# Patient Record
Sex: Female | Born: 1970 | Race: White | Hispanic: No | Marital: Single | Smoking: Never smoker
Health system: Southern US, Community
[De-identification: ages and names within clinical notes are randomized; demographics above are authoritative.]

## PROBLEM LIST (undated history)

## (undated) DIAGNOSIS — I10 Essential (primary) hypertension: Secondary | ICD-10-CM

## (undated) DIAGNOSIS — O24429 Gestational diabetes mellitus in childbirth, unspecified control: Secondary | ICD-10-CM

## (undated) HISTORY — DX: Gestational diabetes mellitus in childbirth, unspecified control: O24.429

## (undated) HISTORY — PX: TONSILLECTOMY AND ADENOIDECTOMY: SUR1326

---
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
## 2008-05-06 ENCOUNTER — Ambulatory Visit: Payer: Self-pay | Admitting: Family Medicine

## 2008-05-06 DIAGNOSIS — J0100 Acute maxillary sinusitis, unspecified: Secondary | ICD-10-CM

## 2008-05-06 DIAGNOSIS — E282 Polycystic ovarian syndrome: Secondary | ICD-10-CM | POA: Insufficient documentation

## 2008-05-06 DIAGNOSIS — J209 Acute bronchitis, unspecified: Secondary | ICD-10-CM

## 2011-08-21 LAB — HM PAP SMEAR: HM Pap smear: NEGATIVE

## 2011-12-27 ENCOUNTER — Encounter: Payer: Self-pay | Admitting: Family Medicine

## 2011-12-27 ENCOUNTER — Ambulatory Visit (INDEPENDENT_AMBULATORY_CARE_PROVIDER_SITE_OTHER): Payer: 59 | Admitting: Family Medicine

## 2011-12-27 VITALS — BP 158/107 | HR 98 | Ht 63.0 in | Wt 242.0 lb

## 2011-12-27 DIAGNOSIS — R7301 Impaired fasting glucose: Secondary | ICD-10-CM

## 2011-12-27 DIAGNOSIS — E669 Obesity, unspecified: Secondary | ICD-10-CM

## 2011-12-27 DIAGNOSIS — Z6829 Body mass index (BMI) 29.0-29.9, adult: Secondary | ICD-10-CM | POA: Insufficient documentation

## 2011-12-27 DIAGNOSIS — E559 Vitamin D deficiency, unspecified: Secondary | ICD-10-CM

## 2011-12-27 DIAGNOSIS — E785 Hyperlipidemia, unspecified: Secondary | ICD-10-CM

## 2011-12-27 DIAGNOSIS — I10 Essential (primary) hypertension: Secondary | ICD-10-CM | POA: Insufficient documentation

## 2011-12-27 DIAGNOSIS — Z6841 Body Mass Index (BMI) 40.0 and over, adult: Secondary | ICD-10-CM

## 2011-12-27 DIAGNOSIS — Z6830 Body mass index (BMI) 30.0-30.9, adult: Secondary | ICD-10-CM | POA: Insufficient documentation

## 2011-12-27 DIAGNOSIS — Z6837 Body mass index (BMI) 37.0-37.9, adult: Secondary | ICD-10-CM | POA: Insufficient documentation

## 2011-12-27 DIAGNOSIS — E119 Type 2 diabetes mellitus without complications: Secondary | ICD-10-CM

## 2011-12-27 MED ORDER — LISINOPRIL 20 MG PO TABS: 20.0000 mg | ORAL_TABLET | Freq: Every day | ORAL | Status: DC

## 2011-12-27 NOTE — Progress Notes (Signed)
Subjective:    Patient ID: Megan [name], female    DOB: 1970/11/28, 41 y.o.   MRN: [id number]  HPI HTN - BP has been high for the 1.5 years. Does run in her family. High at gyn and dentist appt.  Will occ gets HA and flushing when BP is really high. Work has been really stressful.  Had bloodwork done in Jan.  She has been obese and stayed the same weight for the last 11 years.     Review of Systems  Constitutional: Negative for fever, diaphoresis and unexpected weight change.  HENT: Negative for hearing loss, rhinorrhea and tinnitus.   Eyes: Negative for visual disturbance.  Respiratory: Negative for cough and wheezing.   Cardiovascular: Negative for chest pain and palpitations.  Gastrointestinal: Negative for nausea, vomiting, diarrhea and blood in stool.  Genitourinary: Negative for vaginal bleeding, vaginal discharge and difficulty urinating.  Musculoskeletal: Negative for myalgias and arthralgias.  Skin: Negative for rash.  Neurological: Negative for headaches.  Hematological: Negative for adenopathy. Does not bruise/bleed easily.  Psychiatric/Behavioral: Negative for disturbed wake/sleep cycle and dysphoric mood. The patient is not nervous/anxious.    BP 158/107  Pulse 98  Ht 5\' 3"  (1.6 m)  Wt 242 lb (109.77 kg)  BMI 42.87 kg/m2    No Known Allergies  Past Medical History  Diagnosis Date  . Gestational diabetes mellitus in childbirth     Past Surgical History  Procedure Date  . Tonsillectomy and adenoidectomy   . Tympanostomy tube placement 1984    History   Social History  . Marital Status: Single    Spouse Name: Evelynne Spiers    Number of Children: N/A  . Years of Education: N/A   Occupational History  . Asst Director [employer]    Social History Main Topics  . Smoking status: Never Smoker   . Smokeless tobacco: Not on file  . Alcohol Use: No  . Drug Use: No  . Sexually Active: Yes -- Female partner(s)   Other Topics Concern  . Not on file    Social History Narrative   2 caffeine drinks per day. No regular exercise. She works full-time and is going to school.    Family History  Problem Relation Age of Onset  . Hypertension Mother   . Hypertension Father   . Hypertension Sister   . Hypertension Brother   . Peripheral vascular disease Maternal Grandmother     Outpatient Encounter Prescriptions as of 12/27/2011  Medication Sig Dispense Refill  . norgestrel-ethinyl estradiol (LO/OVRAL,CRYSELLE) 0.3-30 MG-MCG tablet Take 1 tablet by mouth daily.      Marland Kitchen lisinopril (PRINIVIL,ZESTRIL) 20 MG tablet Take 1 tablet (20 mg total) by mouth daily.  30 tablet  1          Objective:   Physical Exam  Constitutional: She is oriented to person, place, and time. She appears well-developed and well-nourished.  HENT:  Head: Normocephalic and atraumatic.  Cardiovascular: Normal rate, regular rhythm and normal heart sounds.        No carotid bruits.   Pulmonary/Chest: Effort normal and breath sounds normal.  Musculoskeletal: She exhibits no edema.  Neurological: She is alert and oriented to person, place, and time.  Skin: Skin is warm and dry.  Psychiatric: She has a normal mood and affect. Her behavior is normal.          Assessment & Plan:  HTN - New dx.  Discussed dx and tx options. Discussed DASH  diet and low salt diet.  Will start medications as well. Continue work on exercise and diet. Followup in 4 weeks to recheck blood pressure. She can call at any point she has any problems with the lisinopril. We did discuss potential side effects. She was on a release for me to get blood work from her GYN that was drawn in January.  Obesity - Work on diet and exercise.  Had tried metformin in the past for PCOS abut caused stomach pain and diarrhea. She never tried fiber milligrams once a day to see if that works a little better for her without causing some of the GI symptoms.  Vitamin D deficiency-she does report a history of this. She  was given a prescription for 6 weeks but never went back to have her labs rechecked. She would like to do that today.  Based on her history it sounds like she most likely has impaired fasting glucose. We'll check a hemoglobin A1c today. Hemoglobin A1c confirmed impaired fasting glucose today. Continue to work on avoiding concentrated sweets and watching portion sizes on her carbohydrates and getting regular exercise. We also discussed that we could consider retrying metformin at some point in time but with a very low dosage is 500 mg once a day, since she did not tolerate the twice a day dosing.

## 2011-12-27 NOTE — Patient Instructions (Signed)
DASH Diet The DASH diet stands for "Dietary Approaches to Stop Hypertension." It is a healthy eating plan that has been shown to reduce high blood pressure (hypertension) in as little as 14 days, while also possibly providing other significant health benefits. These other health benefits include reducing the risk of breast cancer after menopause and reducing the risk of type 2 diabetes, heart disease, colon cancer, and stroke. Health benefits also include weight loss and slowing kidney failure in patients with chronic kidney disease.   DIET GUIDELINES  Limit salt (sodium). Your diet should contain less than 1500 mg of sodium daily.   Limit refined or processed carbohydrates. Your diet should include mostly whole grains. Desserts and added sugars should be used sparingly.   Include small amounts of heart-healthy fats. These types of fats include nuts, oils, and tub margarine. Limit saturated and trans fats. These fats have been shown to be harmful in the body.  CHOOSING FOODS   The following food groups are based on a 2000 calorie diet. See your Registered Dietitian for individual calorie needs. Grains and Grain Products (6 to 8 servings daily)  Eat More Often: Whole-wheat bread, brown rice, whole-grain or wheat pasta, quinoa, popcorn without added fat or salt (air popped).   Eat Less Often: White bread, white pasta, white rice, cornbread.  Vegetables (4 to 5 servings daily)  Eat More Often: Fresh, frozen, and canned vegetables. Vegetables may be raw, steamed, roasted, or grilled with a minimal amount of fat.   Eat Less Often/Avoid: Creamed or fried vegetables. Vegetables in a cheese sauce.  Fruit (4 to 5 servings daily)  Eat More Often: All fresh, canned (in natural juice), or frozen fruits. Dried fruits without added sugar. One hundred percent fruit juice ( cup [237 mL] daily).   Eat Less Often: Dried fruits with added sugar. Canned fruit in light or heavy syrup.  Lean Meats, Fish, and  Poultry (2 servings or less daily. One serving is 3 to 4 oz [85-114 g]).  Eat More Often: Ninety percent or leaner ground beef, tenderloin, sirloin. Round cuts of beef, chicken breast, turkey breast. All fish. Grill, bake, or broil your meat. Nothing should be fried.   Eat Less Often/Avoid: Fatty cuts of meat, turkey, or chicken leg, thigh, or wing. Fried cuts of meat or fish.  Dairy (2 to 3 servings)  Eat More Often: Low-fat or fat-free milk, low-fat plain or light yogurt, reduced-fat or part-skim cheese.   Eat Less Often/Avoid: Milk (whole, 2%, skim, or chocolate). Whole milk yogurt. Full-fat cheeses.  Nuts, Seeds, and Legumes (4 to 5 servings per week)  Eat More Often: All without added salt.   Eat Less Often/Avoid: Salted nuts and seeds, canned beans with added salt.  Fats and Sweets (limited)  Eat More Often: Vegetable oils, tub margarines without trans fats, sugar-free gelatin. Mayonnaise and salad dressings.   Eat Less Often/Avoid: Coconut oils, palm oils, butter, stick margarine, cream, half and half, cookies, candy, pie.  FOR MORE INFORMATION The Dash Diet Eating Plan: www.dashdiet.org Document Released: 04/14/2011 Document Reviewed: 04/04/2011 ExitCare Patient Information 2012 [employer].1.5 Gram Low Sodium Diet A 1.5 gram sodium diet restricts the amount of sodium in the diet to no more than 1.5 g or 1500 mg daily. The American Heart Association recommends Americans over the age of 20 to consume no more than 1500 mg of sodium each day to reduce the risk of developing high blood pressure. Research also shows that limiting sodium may reduce heart attack   and stroke risk. Many foods contain sodium for flavor and sometimes as a preservative. When the amount of sodium in a diet needs to be low, it is important to know what to look for when choosing foods and drinks. The following includes some information and guidelines to help make it easier for you to adapt to a low sodium  diet. QUICK TIPS  Do not add salt to food.   Avoid convenience items and fast food.   Choose unsalted snack foods.   Buy lower sodium products, often labeled as "lower sodium" or "no salt added."   Check food labels to learn how much sodium is in 1 serving.   When eating at a restaurant, ask that your food be prepared with less salt or none, if possible.  READING FOOD LABELS FOR SODIUM INFORMATION The nutrition facts label is a good place to find how much sodium is in foods. Look for products with no more than 400 mg of sodium per serving. Remember that 1.5 g = 1500 mg. The food label may also list foods as:  Sodium-free: Less than 5 mg in a serving.   Very low sodium: 35 mg or less in a serving.   Low-sodium: 140 mg or less in a serving.   Light in sodium: 50% less sodium in a serving. For example, if a food that usually has 300 mg of sodium is changed to become light in sodium, it will have 150 mg of sodium.   Reduced sodium: 25% less sodium in a serving. For example, if a food that usually has 400 mg of sodium is changed to reduced sodium, it will have 300 mg of sodium.  CHOOSING FOODS Grains  Avoid: Salted crackers and snack items. Some cereals, including instant hot cereals. Bread stuffing and biscuit mixes. Seasoned rice or pasta mixes.   Choose: Unsalted snack items. Low-sodium cereals, oats, puffed wheat and rice, shredded wheat. English muffins and bread. Pasta.  Meats  Avoid: Salted, canned, smoked, spiced, pickled meats, including fish and poultry. Bacon, ham, sausage, cold cuts, hot dogs, anchovies.   Choose: Low-sodium canned tuna and salmon. Fresh or frozen meat, poultry, and fish.  Dairy  Avoid: Processed cheese and spreads. Cottage cheese. Buttermilk and condensed milk. Regular cheese.   Choose: Milk. Low-sodium cottage cheese. Yogurt. Sour cream. Low-sodium cheese.  Fruits and Vegetables  Avoid: Regular canned vegetables. Regular canned tomato sauce and  paste. Frozen vegetables in sauces. Olives. Pickles. Relishes. Sauerkraut.   Choose: Low-sodium canned vegetables. Low-sodium tomato sauce and paste. Frozen or fresh vegetables. Fresh and frozen fruit.  Condiments  Avoid: Canned and packaged gravies. Worcestershire sauce. Tartar sauce. Barbecue sauce. Soy sauce. Steak sauce. Ketchup. Onion, garlic, and table salt. Meat flavorings and tenderizers.   Choose: Fresh and dried herbs and spices. Low-sodium varieties of mustard and ketchup. Lemon juice. Tabasco sauce. Horseradish.  SAMPLE 1.5 GRAM SODIUM MEAL PLAN Breakfast / Sodium (mg)  1 cup low-fat milk / 143 mg   1 whole-wheat English muffin / 240 mg   1 tbs heart-healthy margarine / 153 mg   1 hard-boiled egg / 139 mg   1 small orange / 0 mg  Lunch / Sodium (mg)  1 cup raw carrots / 76 mg   2 tbs no salt added peanut butter / 5 mg   2 slices whole-wheat bread / 270 mg   1 tbs jelly / 6 mg    cup red grapes / 2 mg  Dinner / Sodium (mg)    1 cup whole-wheat pasta / 2 mg   1 cup low-sodium tomato sauce / 73 mg   3 oz lean ground beef / 57 mg   1 small side salad (1 cup raw spinach leaves,  cup cucumber,  cup yellow bell pepper) with 1 tsp olive oil and 1 tsp red wine vinegar / 25 mg  Snack / Sodium (mg)  1 container low-fat vanilla yogurt / 107 mg   3 graham cracker squares / 127 mg  Nutrient Analysis  Calories: 1745   Protein: 75 g   Carbohydrate: 237 g   Fat: 57 g   Sodium: 1425 mg  Document Released: 04/25/2005 Document Revised: 04/14/2011 Document Reviewed: 07/27/2009 ExitCare Patient Information 2012 [employer]. 

## 2011-12-28 LAB — VITAMIN D 25 HYDROXY (VIT D DEFICIENCY, FRACTURES): Vit D, 25-Hydroxy: 51 ng/mL (ref 30–89)

## 2012-01-30 ENCOUNTER — Encounter: Payer: Self-pay | Admitting: Family Medicine

## 2012-01-30 ENCOUNTER — Ambulatory Visit (INDEPENDENT_AMBULATORY_CARE_PROVIDER_SITE_OTHER): Payer: 59 | Admitting: Family Medicine

## 2012-01-30 VITALS — BP 141/97 | HR 101 | Wt 241.0 lb

## 2012-01-30 DIAGNOSIS — Z23 Encounter for immunization: Secondary | ICD-10-CM

## 2012-01-30 DIAGNOSIS — I10 Essential (primary) hypertension: Secondary | ICD-10-CM

## 2012-01-30 MED ORDER — LISINOPRIL-HYDROCHLOROTHIAZIDE 20-25 MG PO TABS: 1.0000 | ORAL_TABLET | Freq: Every day | ORAL | Status: DC

## 2012-01-30 NOTE — Progress Notes (Signed)
  Subjective:    Patient ID: Megan [name], female    DOB: 1970/07/02, 41 y.o.   MRN: [id number]  HPI HTN -doing well on medication. No chest pain or short of breath. No side effects. She says she's taking it daily. She's continuing to work on diet and weight. She has lost 1 pound.   Review of Systems     Objective:   Physical Exam  Constitutional: She is oriented to person, place, and time. She appears well-developed and well-nourished.  HENT:  Head: Normocephalic and atraumatic.  Cardiovascular: Normal rate, regular rhythm and normal heart sounds.   Pulmonary/Chest: Effort normal and breath sounds normal.  Neurological: She is alert and oriented to person, place, and time.  Skin: Skin is warm and dry.  Psychiatric: She has a normal mood and affect. Her behavior is normal.          Assessment & Plan:  HTN-much improved but still not at goal. Uncontrolled. We will add chlorothiazide to her lisinopril. Follow up in 3-4 weeks to make sure that reaching goal. Check BMP today since he started a ACE inhibitor.  Tdap given today

## 2012-01-31 LAB — BASIC METABOLIC PANEL WITH GFR
BUN: 10 mg/dL (ref 6–23)
CO2: 27 mEq/L (ref 19–32)
Calcium: 9.1 mg/dL (ref 8.4–10.5)
Chloride: 105 mEq/L (ref 96–112)
Creat: 0.79 mg/dL (ref 0.50–1.10)
GFR, Est Non African American: >89 mL/min

## 2012-02-03 ENCOUNTER — Telehealth: Payer: Self-pay | Admitting: *Deleted

## 2012-02-03 NOTE — Telephone Encounter (Signed)
Pt notified of MD instrutions and sent to scheduleing to make NV appt for Monday. KG LPN

## 2012-02-03 NOTE — Telephone Encounter (Signed)
Pt states she started taking the Lisinopril-HCTZ on Tuesday and states that she has had increased pulse, dizzy, hot spells and muscle spasms.

## 2012-02-03 NOTE — Telephone Encounter (Signed)
BP could be dropping too low which can cause tachycardia and dizziness. Recommend cut tab in half through the weekend and come in on Monday for blood pressure check.

## 2012-02-06 ENCOUNTER — Ambulatory Visit (INDEPENDENT_AMBULATORY_CARE_PROVIDER_SITE_OTHER): Payer: 59 | Admitting: Family Medicine

## 2012-02-06 VITALS — BP 150/97 | HR 105

## 2012-02-06 DIAGNOSIS — I10 Essential (primary) hypertension: Secondary | ICD-10-CM

## 2012-02-06 MED ORDER — LOSARTAN POTASSIUM-HCTZ 100-12.5 MG PO TABS: 1.0000 | ORAL_TABLET | Freq: Every day | ORAL | Status: DC

## 2012-02-06 NOTE — Progress Notes (Signed)
  Subjective:    Patient ID: Megan [name], female    DOB: [birth year], 41 y.o.   MRN: [id number]  HPI    Pt denies chest pain, SOB, dizziness, or heart palpitations.  Taking half of the BP med as directed.Cramping in the legs not as worse on Friday. 5 min spent with the pt.   Pt states she started taking the Lisinopril-HCTZ on Tuesday and states that she has had increased pulse, dizzy, hot spells and muscle spasms. I had told her to cut her tab in half.     Review of Systems     Objective:   Physical Exam        Assessment & Plan:  HTN - Not well controlled.  Will change to losartan 100/12.5mg  since didn't feel well on 25mg  of HCTZ.  F/U in one month with med for BP.   Nani Gasser, MD

## 2012-02-07 ENCOUNTER — Telehealth: Payer: Self-pay | Admitting: *Deleted

## 2012-02-07 NOTE — Telephone Encounter (Signed)
Pt was notified of dr. Shelah Lewandowsky advise re nurse visit this am

## 2012-02-20 ENCOUNTER — Ambulatory Visit (INDEPENDENT_AMBULATORY_CARE_PROVIDER_SITE_OTHER): Payer: 59 | Admitting: Family Medicine

## 2012-02-20 ENCOUNTER — Encounter: Payer: Self-pay | Admitting: Family Medicine

## 2012-02-20 VITALS — BP 153/93 | HR 101 | Ht 62.5 in | Wt 244.0 lb

## 2012-02-20 DIAGNOSIS — I10 Essential (primary) hypertension: Secondary | ICD-10-CM

## 2012-02-20 NOTE — Progress Notes (Signed)
  Subjective:    Patient ID: Makenley Schwieterman, female    DOB: Sep 22, 1970, 41 y.o.   MRN: [id number]  HPI HTN - She didn't take her meds until about about an hour ago. Was staying at sisters house and forget hre meds. She has been feeling much better on the lower diuretic.  Dizziness is resolved. No LE swelling.    Review of Systems     Objective:   Physical Exam  Constitutional: She is oriented to person, place, and time. She appears well-developed and well-nourished.  HENT:  Head: Normocephalic and atraumatic.  Cardiovascular: Normal rate, regular rhythm and normal heart sounds.   Pulmonary/Chest: Effort normal and breath sounds normal.  Neurological: She is alert and oriented to person, place, and time.  Skin: Skin is warm and dry.  Psychiatric: She has a normal mood and affect. Her behavior is normal.          Assessment & Plan:  HTN - Continue current regimen.  Will avoid increasing her hctz to 25. If still not controlled when comes back in for nurse BP check then will add metoprolol.

## 2012-02-24 ENCOUNTER — Ambulatory Visit (INDEPENDENT_AMBULATORY_CARE_PROVIDER_SITE_OTHER): Payer: 59 | Admitting: Family Medicine

## 2012-02-24 VITALS — BP 139/88 | HR 86

## 2012-02-24 DIAGNOSIS — I10 Essential (primary) hypertension: Secondary | ICD-10-CM

## 2012-02-24 MED ORDER — METOPROLOL SUCCINATE ER 25 MG PO TB24: 25.0000 mg | ORAL_TABLET | Freq: Every day | ORAL | Status: DC

## 2012-02-24 NOTE — Progress Notes (Signed)
LMOM

## 2012-02-24 NOTE — Progress Notes (Signed)
  Subjective:    Patient ID: Megan [name], female    DOB: 1971/05/07, 41 y.o.   MRN: [id number] Pt denies CP, SOB, dizziness, or heart palpitations. taking meds as directed without problems. Denies med side effects. 5 min spent with pt.  HPI    Review of Systems     Objective:   Physical Exam        Assessment & Plan:  HTN - Borderline.  Will add metoprolol to her regimen. Followup with me in 6 weeks for repeat blood pressure. Prescription sent to pharmacy.Nani Gasser, MD

## 2012-04-04 ENCOUNTER — Ambulatory Visit: Payer: 59 | Admitting: Family Medicine

## 2012-04-12 ENCOUNTER — Encounter: Payer: Self-pay | Admitting: Family Medicine

## 2012-04-12 ENCOUNTER — Ambulatory Visit (INDEPENDENT_AMBULATORY_CARE_PROVIDER_SITE_OTHER): Payer: 59 | Admitting: Family Medicine

## 2012-04-12 VITALS — BP 114/74 | HR 85 | Ht 63.0 in | Wt 239.0 lb

## 2012-04-12 DIAGNOSIS — E669 Obesity, unspecified: Secondary | ICD-10-CM

## 2012-04-12 DIAGNOSIS — I10 Essential (primary) hypertension: Secondary | ICD-10-CM

## 2012-04-12 LAB — BASIC METABOLIC PANEL WITH GFR
Chloride: 106 mEq/L (ref 96–112)
GFR, Est African American: >89 mL/min
GFR, Est Non African American: 82 mL/min
Potassium: 3.9 mEq/L (ref 3.5–5.3)
Sodium: 141 mEq/L (ref 135–145)

## 2012-04-12 LAB — LIPID PANEL
Total CHOL/HDL Ratio: 4.5 Ratio
VLDL: 18 mg/dL (ref 0–40)

## 2012-04-12 NOTE — Progress Notes (Signed)
  Subjective:    Patient ID: Megan [name], female    DOB: [birth year], 41 y.o.   MRN: [id number]  HPI HTN -  Pt denies chest pain, SOB, dizziness, or heart palpitations.  Taking meds as directed w/o problems.  Denies medication side effects.  Tolerating new med well. Has lost 5 lbs.      Review of Systems     Objective:   Physical Exam  Constitutional: She is oriented to person, place, and time. She appears well-developed and well-nourished.  HENT:  Head: Normocephalic and atraumatic.  Cardiovascular: Normal rate, regular rhythm and normal heart sounds.   Pulmonary/Chest: Effort normal and breath sounds normal.  Neurological: She is alert and oriented to person, place, and time.  Skin: Skin is warm and dry.  Psychiatric: She has a normal mood and affect. Her behavior is normal.          Assessment & Plan:  HTN  - Well controlled. Doing well on regimen. F/U in 6 months. Great job on weight loss. Due Frlo CMP and lipids.   Obesity - she is doing great!  Has lost 5 days. Encourage exercise too.

## 2012-04-20 ENCOUNTER — Other Ambulatory Visit: Payer: Self-pay | Admitting: Family Medicine

## 2012-06-06 ENCOUNTER — Other Ambulatory Visit: Payer: Self-pay | Admitting: Family Medicine

## 2012-06-09 ENCOUNTER — Other Ambulatory Visit: Payer: Self-pay | Admitting: Family Medicine

## 2012-08-27 ENCOUNTER — Other Ambulatory Visit: Payer: Self-pay | Admitting: Family Medicine

## 2012-09-13 ENCOUNTER — Telehealth: Payer: Self-pay | Admitting: *Deleted

## 2012-09-13 NOTE — Telephone Encounter (Signed)
Pt called to inform that she will have a lapse in insurance coverage and will need med refills. lvm asking for return call.Megan [name] [place]

## 2012-09-24 ENCOUNTER — Telehealth: Payer: Self-pay | Admitting: Family Medicine

## 2012-09-24 NOTE — Telephone Encounter (Signed)
Call pt: letter fro insurance company says not taking her meds reguarly.  Encourage her to be consistant and if needs help getting her meds then please let us know.

## 2012-09-25 NOTE — Telephone Encounter (Signed)
Spoke with pt she stated that she is taking her meds on a consistent basis  And has a f/u appt on 6.5.2014 for bp.Heath Gold'

## 2012-09-25 NOTE — Telephone Encounter (Signed)
Called and lvm for return call.Megan [name]  

## 2012-10-11 ENCOUNTER — Encounter: Payer: Self-pay | Admitting: Family Medicine

## 2012-10-11 ENCOUNTER — Ambulatory Visit (INDEPENDENT_AMBULATORY_CARE_PROVIDER_SITE_OTHER): Payer: 59 | Admitting: Family Medicine

## 2012-10-11 ENCOUNTER — Ambulatory Visit (HOSPITAL_BASED_OUTPATIENT_CLINIC_OR_DEPARTMENT_OTHER)
Admission: RE | Admit: 2012-10-11 | Discharge: 2012-10-11 | Disposition: A | Discharge status: Home / Self Care | Payer: 59 | Source: Ambulatory Visit | Attending: Family Medicine | Admitting: Family Medicine

## 2012-10-11 ENCOUNTER — Telehealth: Payer: Self-pay | Admitting: Family Medicine

## 2012-10-11 VITALS — BP 120/70 | HR 78 | Ht 63.0 in | Wt 236.0 lb

## 2012-10-11 DIAGNOSIS — I10 Essential (primary) hypertension: Secondary | ICD-10-CM

## 2012-10-11 DIAGNOSIS — M545 Low back pain: Secondary | ICD-10-CM

## 2012-10-11 DIAGNOSIS — M51379 Other intervertebral disc degeneration, lumbosacral region without mention of lumbar back pain or lower extremity pain: Secondary | ICD-10-CM | POA: Insufficient documentation

## 2012-10-11 DIAGNOSIS — M5137 Other intervertebral disc degeneration, lumbosacral region: Secondary | ICD-10-CM | POA: Insufficient documentation

## 2012-10-11 DIAGNOSIS — M4120 Other idiopathic scoliosis, site unspecified: Secondary | ICD-10-CM | POA: Insufficient documentation

## 2012-10-11 DIAGNOSIS — R7301 Impaired fasting glucose: Secondary | ICD-10-CM

## 2012-10-11 LAB — POCT GLYCOSYLATED HEMOGLOBIN (HGB A1C): Hemoglobin A1C: 6.3

## 2012-10-11 MED ORDER — LOSARTAN POTASSIUM-HCTZ 100-12.5 MG PO TABS: ORAL_TABLET | ORAL | Status: DC

## 2012-10-11 MED ORDER — METOPROLOL SUCCINATE ER 25 MG PO TB24: ORAL_TABLET | ORAL | Status: DC

## 2012-10-11 NOTE — Telephone Encounter (Signed)
Called pt lvm for return call.Megan [name]  

## 2012-10-11 NOTE — Telephone Encounter (Signed)
Pt stated that when she took the metformin in the past it caused GI upset and she wanted to know if there was an alternative that she could use? If not she is willing to try it again. She also wanted to know about her back x-ray results. She will come by next week and p/u the back exercises and asked that if I needed to contact her to call her back @ [phone].Loralee Pacas [place]

## 2012-10-11 NOTE — Telephone Encounter (Signed)
Called pt and lvm informing her that her A1c was 6.3 and to work on a low carb and low sugar diet. She will need to work on doing regular exercising. Dr. Linford Arnold would like to add Metformin being that she has a hx of PCOS and this will aid in controlling her BS along with helping her to loose some weight. Dr. Linford Arnold has a print out for her to help her with the low back pain that she is having and she can either come by and p/u at the front desk or it can be mailed to her. Laureen Ochs, Viann Shove

## 2012-10-11 NOTE — Telephone Encounter (Signed)
Call pt:  A1c is 6.3 today. Still in the impaired fasting glucose range. Continue work on low carb and low sugar diet as well as regular exercise and weight loss. We could consider adding metformin especially with her history of PCO S. and this will help control her sugars as well as may help with some weight loss. F/U in 6 months.

## 2012-10-11 NOTE — Progress Notes (Signed)
Subjective:    Patient ID: Megan [name], female    DOB: 09-13-1970, 42 y.o.   MRN: [id number]  HPI HTN-  Pt denies chest pain, SOB, dizziness, or heart palpitations.  Taking meds as directed w/o problems.  Denies medication side effects.    IFG - No inc thrist or urination. Last A1c was a year ago.  Low back pain - x 6-8 weeks.  Thought started sitting in certain chair at son's baseball practice. Worse if would cross her legs. Says more painful if lays on her right side.  Says in the lumbar spine area.  Worse when she turns steering wheel. Says worse if sitting for a longer period of time. Using Aleve but no relief. Pain si mild o rmoderate. No numbness or tingling in the legs. No radiation of pain.  No trauma or injury.  Hx of scoliosis.     Review of Systems  BP 120/70  Pulse 78  Ht 5\' 3"  (1.6 m)  Wt 236 lb (107.049 kg)  BMI 41.82 kg/m2  LMP 09/17/2012    No Known Allergies  Past Medical History  Diagnosis Date  . Gestational diabetes mellitus in childbirth     Past Surgical History  Procedure Laterality Date  . Tonsillectomy and adenoidectomy    . Tympanostomy tube placement  1984    History   Social History  . Marital Status: Single    Spouse Name: Delma Drone    Number of Children: N/A  . Years of Education: N/A   Occupational History  . Asst Director [employer]    Social History Main Topics  . Smoking status: Never Smoker   . Smokeless tobacco: Not on file  . Alcohol Use: No  . Drug Use: No  . Sexually Active: Yes -- Female partner(s)   Other Topics Concern  . Not on file   Social History Narrative   2 caffeine drinks per day. No regular exercise. She works full-time and is going to school.    Family History  Problem Relation Age of Onset  . Hypertension Mother   . Hypertension Father   . Hypertension Sister   . Hypertension Brother   . Peripheral vascular disease Maternal Grandmother     Outpatient Encounter Prescriptions as of  10/11/2012  Medication Sig Dispense Refill  . losartan-hydrochlorothiazide (HYZAAR) 100-12.5 MG per tablet TAKE ONE TABLET BY MOUTH ONE TIME DAILY  30 tablet  3  . metoprolol succinate (TOPROL-XL) 25 MG 24 hr tablet TAKE ONE TABLET BY MOUTH ONE TIME DAILY  30 tablet  2  . norgestrel-ethinyl estradiol (LO/OVRAL,CRYSELLE) 0.3-30 MG-MCG tablet Take 1 tablet by mouth daily.      . [DISCONTINUED] losartan-hydrochlorothiazide (HYZAAR) 100-12.5 MG per tablet TAKE ONE TABLET BY MOUTH ONE TIME DAILY  30 tablet  3  . [DISCONTINUED] metoprolol succinate (TOPROL-XL) 25 MG 24 hr tablet TAKE ONE TABLET BY MOUTH ONE TIME DAILY  30 tablet  2   No facility-administered encounter medications on file as of 10/11/2012.          Objective:   Physical Exam  Constitutional: She is oriented to person, place, and time. She appears well-developed and well-nourished.  HENT:  Head: Normocephalic and atraumatic.  Cardiovascular: Normal rate, regular rhythm and normal heart sounds.   Pulmonary/Chest: Effort normal and breath sounds normal.  Musculoskeletal:  Normal flexion, extension, rotation, side bending. She is very tender over the upper lumbar spine itself. Mildly tender over the left paraspinous muscles.  Nontender over the SI joints. Negative straight leg raise bilaterally. Hip, knee, ankle strength is 5 out of 5 bilaterally. Patellar reflexes are 2+. Scoliosis of the thoracic spine.  Neurological: She is alert and oriented to person, place, and time.  Skin: Skin is warm and dry.  Psychiatric: She has a normal mood and affect. Her behavior is normal.          Assessment & Plan:  HTN - well controlled.  F/U in 6 months.   IFG- Will check A1C today.  A1c is 6.3 today. Still in the impaired fasting glucose range. Continue work on low carb and low sugar diet as well as regular exercise and weight loss. We could consider adding metformin especially with her history of PCO S. and this will help control her sugars  as well as may help with some weight loss. F/U in 6 months.    Low Back Pain - likely musculoskeletal but I am concerned that she's actually tender over the spine itself so will order xray of lumbar spine . Tx with Aleve for inflammation and pain. Stop if any GI irritation.  Given exercises to do for the lumbar spine. She can start it at home as well as a heating pad. Will call with results.  Consider formal physical therapy if not improving.

## 2012-10-12 ENCOUNTER — Other Ambulatory Visit: Payer: Self-pay | Admitting: Family Medicine

## 2012-10-12 DIAGNOSIS — M4306 Spondylolysis, lumbar region: Secondary | ICD-10-CM

## 2012-10-12 DIAGNOSIS — M4310 Spondylolisthesis, site unspecified: Secondary | ICD-10-CM

## 2012-10-15 ENCOUNTER — Ambulatory Visit (INDEPENDENT_AMBULATORY_CARE_PROVIDER_SITE_OTHER): Payer: 59 | Admitting: Family Medicine

## 2012-10-15 ENCOUNTER — Encounter: Payer: Self-pay | Admitting: Family Medicine

## 2012-10-15 VITALS — BP 127/73 | HR 94 | Wt 240.0 lb

## 2012-10-15 DIAGNOSIS — R7301 Impaired fasting glucose: Secondary | ICD-10-CM

## 2012-10-15 DIAGNOSIS — M4306 Spondylolysis, lumbar region: Secondary | ICD-10-CM | POA: Insufficient documentation

## 2012-10-15 DIAGNOSIS — M4310 Spondylolisthesis, site unspecified: Secondary | ICD-10-CM

## 2012-10-15 DIAGNOSIS — E282 Polycystic ovarian syndrome: Secondary | ICD-10-CM

## 2012-10-15 MED ORDER — METFORMIN HCL ER 500 MG PO TB24: 500.0000 mg | ORAL_TABLET | Freq: Every day | ORAL | Status: DC

## 2012-10-15 MED ORDER — HYDROCODONE-ACETAMINOPHEN 5-325 MG PO TABS: 1.0000 | ORAL_TABLET | Freq: Four times a day (QID) | ORAL | Status: DC | PRN

## 2012-10-15 NOTE — Progress Notes (Signed)
  Subjective:    Patient ID: Megan [name], female    DOB: January 02, 1971, 42 y.o.   MRN: [id number]  HPI Here to review the results of her lumbar x-ray. She had a bilateral pars defect. She is still having significant pain. She tried Tylenol, ibuprofen, and Aleve none of which seem to be helping her anymore. Her pain has been going on for several months at this point time and has been gradually getting worse. She's had a couple nights ago it kept her awake. But then yesterday was a little better but today it is worse again. When she got back on her initial injury she had helped a friend move that weekend and wonders if it may have occurred during that time frame.   Review of Systems     Objective:   Physical Exam  Constitutional: She appears well-developed and well-nourished.  HENT:  Head: Normocephalic and atraumatic.  Skin: Skin is warm and dry.  Psychiatric: She has a normal mood and affect. Her behavior is normal.          Assessment & Plan:  Bilateral pars defect-discussed diagnosis with her. Also reviewed her films with her and showed her the abnormality. We discussed conservative treatment with physical therapy, back brace, and pain control. Since typical over-the-counter anti-inflammatories are not helpful and she's been taking them on and off for several months will switch to hydrocodone. Encouraged her not to use this and drive as it can be sedating and appear her. She agrees to use it only in the evening. She can use an anti-inflammatory during the daytime if she would like. We will also get her scheduled for physical therapy. We did not have any back brace is here so she can certainly find one of the local pharmacy. I would like to see her back in 3 weeks to make sure that she's making some improvement with physical therapy and to make sure that her pain control is adequate.  We also did blood work last time to evaluate her for diabetes. It does show that she has impaired fasting  glucose. She says she's tried metformin in the past but it caused excessive diarrhea. She was on 1500 mg a day at one point time for her PCO S. She is willing to try the low-dose extended release version to see if she tolerates it better. This would help with her impaired fasting glucose as well as her PCO S.

## 2012-10-18 ENCOUNTER — Ambulatory Visit: Payer: 59 | Attending: Family Medicine | Admitting: Physical Therapy

## 2012-10-18 DIAGNOSIS — M6281 Muscle weakness (generalized): Secondary | ICD-10-CM | POA: Insufficient documentation

## 2012-10-18 DIAGNOSIS — IMO0001 Reserved for inherently not codable concepts without codable children: Secondary | ICD-10-CM | POA: Insufficient documentation

## 2012-10-18 DIAGNOSIS — M545 Low back pain, unspecified: Secondary | ICD-10-CM | POA: Insufficient documentation

## 2012-10-24 ENCOUNTER — Ambulatory Visit: Payer: 59 | Admitting: Physical Therapy

## 2012-10-26 ENCOUNTER — Telehealth: Payer: Self-pay | Admitting: *Deleted

## 2012-10-26 ENCOUNTER — Ambulatory Visit: Payer: 59 | Admitting: Physical Therapy

## 2012-10-26 MED ORDER — CYCLOBENZAPRINE HCL 10 MG PO TABS: ORAL_TABLET | ORAL | Status: DC

## 2012-10-26 NOTE — Telephone Encounter (Signed)
Pt notified [facility] and does have a back brace and is wearing it.  Has f/u with Dr. Linford Arnold on the 30th. Barry Dienes, LPN

## 2012-10-26 NOTE — Telephone Encounter (Signed)
Pt calls and states being treated for pars defect L5 and doing PT but having muscle spasms and wants to know if she could maybe get a muscle relaxer to take after therapy. Please advise

## 2012-10-26 NOTE — Telephone Encounter (Signed)
Cyclobenzaprine called in. She needs to work extremely hard with physical therapy, considering grade 2 spondylolisthesis. She have a back brace yet?

## 2012-10-29 ENCOUNTER — Ambulatory Visit: Payer: 59 | Admitting: Physical Therapy

## 2012-10-30 ENCOUNTER — Ambulatory Visit: Payer: 59 | Admitting: Physical Therapy

## 2012-11-01 ENCOUNTER — Ambulatory Visit: Payer: 59 | Admitting: Physical Therapy

## 2012-11-05 ENCOUNTER — Encounter: Payer: Self-pay | Admitting: Family Medicine

## 2012-11-05 ENCOUNTER — Ambulatory Visit (INDEPENDENT_AMBULATORY_CARE_PROVIDER_SITE_OTHER): Payer: 59 | Admitting: Family Medicine

## 2012-11-05 VITALS — BP 139/79 | HR 83 | Wt 239.0 lb

## 2012-11-05 DIAGNOSIS — M4306 Spondylolysis, lumbar region: Secondary | ICD-10-CM

## 2012-11-05 DIAGNOSIS — M4310 Spondylolisthesis, site unspecified: Secondary | ICD-10-CM

## 2012-11-05 MED ORDER — MELOXICAM 15 MG PO TABS: 15.0000 mg | ORAL_TABLET | Freq: Every day | ORAL | Status: DC | PRN

## 2012-11-05 NOTE — Progress Notes (Signed)
  Subjective:    Patient ID: Megan [name], female    DOB: 1970/10/21, 42 y.o.   MRN: [id number]  HPI F/U on pars defect.  No pain with walking. Still worse with sitting. Says some days are better than others. Yesterday was a bad daily. Using the hydrocodone about 3 times a week.  Occ using muscle relaxers. She is ok with conitnuing PT for 2 more sessions for 4 more weeks.  Pain is usually worse by the end of the day. 3 weeks since last xray. She really hasn't been wearing the brace bc made her abdomen uncomfortable.  I did review the report from physical therapy.  Review of Systems     Objective:   Physical Exam  Constitutional: She appears well-developed and well-nourished.  Psychiatric: She has a normal mood and affect. Her behavior is normal.          Assessment & Plan:  Pars defect. Will continue PT , wearing brace. Continue four more weeks of PT. Then will get CT of lumbar spine for further evaluation. Can use pain meds prn. She is using them sparingly. Will add mobic for better pain control. Call if needs refills on the medication. I did encourage her call me about a week before she comes back and we can order the CT for further evaluation and make sure that it shows signs of healing and I will have the results back when she comes back in.  Time spent 20 min, >50% spent counseling about her back in discussing care plan.

## 2012-11-07 ENCOUNTER — Encounter: Payer: 59 | Admitting: Physical Therapy

## 2012-11-08 ENCOUNTER — Ambulatory Visit: Payer: 59 | Attending: Family Medicine | Admitting: Physical Therapy

## 2012-11-08 DIAGNOSIS — IMO0001 Reserved for inherently not codable concepts without codable children: Secondary | ICD-10-CM | POA: Insufficient documentation

## 2012-11-08 DIAGNOSIS — M545 Low back pain, unspecified: Secondary | ICD-10-CM | POA: Insufficient documentation

## 2012-11-08 DIAGNOSIS — M6281 Muscle weakness (generalized): Secondary | ICD-10-CM | POA: Insufficient documentation

## 2012-11-14 ENCOUNTER — Ambulatory Visit: Payer: 59 | Admitting: Physical Therapy

## 2012-11-16 ENCOUNTER — Ambulatory Visit: Payer: 59 | Admitting: Physical Therapy

## 2012-11-20 ENCOUNTER — Ambulatory Visit: Payer: 59 | Admitting: Physical Therapy

## 2012-11-23 ENCOUNTER — Ambulatory Visit: Payer: 59 | Admitting: Physical Therapy

## 2012-11-28 ENCOUNTER — Telehealth: Payer: Self-pay

## 2012-11-28 DIAGNOSIS — M4306 Spondylolysis, lumbar region: Secondary | ICD-10-CM

## 2012-11-28 NOTE — Telephone Encounter (Signed)
Megan [name] called and left a message stating she can go next Friday for the CT. I don't see and order.

## 2012-11-28 NOTE — Telephone Encounter (Signed)
Order placed for [facility]

## 2012-11-28 NOTE — Telephone Encounter (Signed)
No precert needed

## 2012-12-03 ENCOUNTER — Ambulatory Visit (HOSPITAL_BASED_OUTPATIENT_CLINIC_OR_DEPARTMENT_OTHER)
Admission: RE | Admit: 2012-12-03 | Discharge: 2012-12-03 | Disposition: A | Discharge status: Home / Self Care | Payer: 59 | Source: Ambulatory Visit | Attending: Family Medicine | Admitting: Family Medicine

## 2012-12-03 DIAGNOSIS — Q762 Congenital spondylolisthesis: Secondary | ICD-10-CM | POA: Insufficient documentation

## 2012-12-03 DIAGNOSIS — M4120 Other idiopathic scoliosis, site unspecified: Secondary | ICD-10-CM | POA: Insufficient documentation

## 2012-12-03 DIAGNOSIS — M5137 Other intervertebral disc degeneration, lumbosacral region: Secondary | ICD-10-CM | POA: Insufficient documentation

## 2012-12-03 DIAGNOSIS — M4306 Spondylolysis, lumbar region: Secondary | ICD-10-CM

## 2012-12-03 DIAGNOSIS — M51379 Other intervertebral disc degeneration, lumbosacral region without mention of lumbar back pain or lower extremity pain: Secondary | ICD-10-CM | POA: Insufficient documentation

## 2012-12-03 DIAGNOSIS — M545 Low back pain, unspecified: Secondary | ICD-10-CM | POA: Insufficient documentation

## 2012-12-04 ENCOUNTER — Other Ambulatory Visit: Payer: Self-pay | Admitting: Family Medicine

## 2012-12-04 DIAGNOSIS — M4306 Spondylolysis, lumbar region: Secondary | ICD-10-CM

## 2012-12-10 ENCOUNTER — Ambulatory Visit: Payer: 59 | Admitting: Family Medicine

## 2012-12-10 ENCOUNTER — Other Ambulatory Visit: Payer: Self-pay | Admitting: *Deleted

## 2012-12-10 MED ORDER — HYDROCODONE-ACETAMINOPHEN 5-325 MG PO TABS: 1.0000 | ORAL_TABLET | Freq: Four times a day (QID) | ORAL | Status: AC | PRN

## 2012-12-19 ENCOUNTER — Other Ambulatory Visit: Payer: Self-pay | Admitting: Sports Medicine

## 2013-03-17 ENCOUNTER — Other Ambulatory Visit: Payer: Self-pay | Admitting: Family Medicine

## 2013-04-01 ENCOUNTER — Other Ambulatory Visit: Payer: Self-pay | Admitting: Family Medicine

## 2013-04-01 NOTE — Telephone Encounter (Signed)
Call pt: Insurance sent letter with concerns of being complaint with medications for HTN. If there is any concerns please let us know. We do encourage pt to take all medication as directed.

## 2013-04-12 ENCOUNTER — Ambulatory Visit: Payer: 59 | Admitting: Family Medicine

## 2013-04-17 ENCOUNTER — Ambulatory Visit (INDEPENDENT_AMBULATORY_CARE_PROVIDER_SITE_OTHER): Payer: 59 | Admitting: Family Medicine

## 2013-04-17 ENCOUNTER — Encounter: Payer: Self-pay | Admitting: Family Medicine

## 2013-04-17 VITALS — BP 128/82 | HR 99 | Resp 16 | Wt 239.0 lb

## 2013-04-17 DIAGNOSIS — M4310 Spondylolisthesis, site unspecified: Secondary | ICD-10-CM

## 2013-04-17 DIAGNOSIS — E282 Polycystic ovarian syndrome: Secondary | ICD-10-CM

## 2013-04-17 DIAGNOSIS — I10 Essential (primary) hypertension: Secondary | ICD-10-CM

## 2013-04-17 DIAGNOSIS — M4306 Spondylolysis, lumbar region: Secondary | ICD-10-CM

## 2013-04-17 DIAGNOSIS — R7301 Impaired fasting glucose: Secondary | ICD-10-CM

## 2013-04-17 LAB — COMPLETE METABOLIC PANEL WITH GFR
ALT: 21 U/L (ref 0–35)
AST: 17 U/L (ref 0–37)
Albumin: 3.5 g/dL (ref 3.5–5.2)
CO2: 29 mEq/L (ref 19–32)
Calcium: 9.2 mg/dL (ref 8.4–10.5)
Chloride: 103 mEq/L (ref 96–112)
Creat: 0.8 mg/dL (ref 0.50–1.10)
GFR, Est African American: >89 mL/min
Potassium: 3.9 mEq/L (ref 3.5–5.3)
Total Protein: 6.1 g/dL (ref 6.0–8.3)

## 2013-04-17 LAB — LIPID PANEL
LDL Cholesterol: 102 mg/dL — ABNORMAL HIGH (ref 0–99)
Triglycerides: 125 mg/dL (ref <150)
VLDL: 25 mg/dL (ref 0–40)

## 2013-04-17 LAB — POCT GLYCOSYLATED HEMOGLOBIN (HGB A1C): Hemoglobin A1C: 6.2

## 2013-04-17 NOTE — Progress Notes (Signed)
Subjective:    Patient ID: Megan [name], female    DOB: 1971-03-15, 42 y.o.   MRN: [id number]  HPI HTN -  Pt denies chest pain, SOB, dizziness, or heart palpitations.  Taking meds as directed w/o problems.  Denies medication side effects. On metoprolol  IFG - no hypoglycemic events or shakes.  PCOS - Off the metformin. Really upset her stomach. Has not been able to exercise because of her low back pain. She currently has a pars defect is working with the orthopedist.  Review of Systems     BP 128/82  Pulse 99  Resp 16  Wt 239 lb (108.41 kg)  SpO2 98%    Allergies  Allergen Reactions  . Metformin And Related Other (See Comments)    GI upset     Past Medical History  Diagnosis Date  . Gestational diabetes mellitus in childbirth     Past Surgical History  Procedure Laterality Date  . Tonsillectomy and adenoidectomy    . Tympanostomy tube placement  1984    History   Social History  . Marital Status: Single    Spouse Name: Cambryn Charters    Number of Children: N/A  . Years of Education: N/A   Occupational History  . Asst Director [employer]    Social History Main Topics  . Smoking status: Never Smoker   . Smokeless tobacco: Not on file  . Alcohol Use: No  . Drug Use: No  . Sexual Activity: Yes    Partners: Male   Other Topics Concern  . Not on file   Social History Narrative   2 caffeine drinks per day. No regular exercise. She works full-time and is going to school.    Family History  Problem Relation Age of Onset  . Hypertension Mother   . Hypertension Father   . Hypertension Sister   . Hypertension Brother   . Peripheral vascular disease Maternal Grandmother     Outpatient Encounter Prescriptions as of 04/17/2013  Medication Sig  . cyclobenzaprine (FLEXERIL) 10 MG tablet Take 10 mg by mouth 3 (three) times daily as needed.  Marland Kitchen HYDROcodone-acetaminophen (NORCO/VICODIN) 5-325 MG per tablet Take 1-2 tablets by mouth every 6 (six)  hours as needed for pain.  Marland Kitchen losartan-hydrochlorothiazide (HYZAAR) 100-12.5 MG per tablet Take one tablet by mouth one time daily  . metoprolol succinate (TOPROL-XL) 25 MG 24 hr tablet Take one tablet by mouth one time daily  . norgestrel-ethinyl estradiol (LO/OVRAL,CRYSELLE) 0.3-30 MG-MCG tablet Take 1 tablet by mouth daily.  . [DISCONTINUED] cyclobenzaprine (FLEXERIL) 10 MG tablet TAKE ONE-HALF TAB BY MOUTH NIGHTLY AT BEDTIME, THEN GRADUALLY INCREASE TO ONE TAB BY MOUTH THREE TIMES DAILY.  . [DISCONTINUED] meloxicam (MOBIC) 15 MG tablet Take 1 tablet (15 mg total) by mouth daily as needed for pain.  . [DISCONTINUED] metFORMIN (GLUCOPHAGE XR) 500 MG 24 hr tablet Take 1 tablet (500 mg total) by mouth daily with breakfast.        Objective:   Physical Exam  Constitutional: She is oriented to person, place, and time. She appears well-developed and well-nourished.  HENT:  Head: Normocephalic and atraumatic.  Cardiovascular: Normal rate, regular rhythm and normal heart sounds.   Pulmonary/Chest: Effort normal and breath sounds normal.  Neurological: She is alert and oriented to person, place, and time.  Skin: Skin is warm and dry.  Psychiatric: She has a normal mood and affect. Her behavior is normal.  Assessment & Plan:  HTN - Well controlled. F/Uin 6 months. Refills sent to pharmacy.  IFG - A1C is 6.2.  Off metformin bc couldn't tolerate it.  We'll continue to work on diet and exercise.  PCOS - Conintue to work on diet and exercise, since has not tolerated metformin..   Lumbar pars defect - Seeing ortho. Had nerve radiofrequency ablation and idnd't help. Is schedule for more steroid injection this week.   Declined flu vaccine.

## 2013-04-27 ENCOUNTER — Other Ambulatory Visit: Payer: Self-pay | Admitting: Family Medicine

## 2013-07-11 ENCOUNTER — Telehealth: Payer: Self-pay | Admitting: *Deleted

## 2013-07-11 NOTE — Telephone Encounter (Signed)
Pt called and wanted to know if we could send notes to [facility] for 2nd opinion Dr. Redmond Pulling. [phone]. No referral only needing OV notes and PT .Marland KitchenAudelia Hives [place]

## 2013-08-08 ENCOUNTER — Other Ambulatory Visit: Payer: Self-pay | Admitting: Family Medicine

## 2013-09-26 ENCOUNTER — Other Ambulatory Visit: Payer: Self-pay | Admitting: Family Medicine

## 2013-10-16 ENCOUNTER — Encounter: Payer: Self-pay | Admitting: Family Medicine

## 2013-10-16 ENCOUNTER — Ambulatory Visit (INDEPENDENT_AMBULATORY_CARE_PROVIDER_SITE_OTHER): Payer: 59 | Admitting: Family Medicine

## 2013-10-16 VITALS — BP 104/61 | HR 82 | Wt 237.0 lb

## 2013-10-16 DIAGNOSIS — L738 Other specified follicular disorders: Secondary | ICD-10-CM

## 2013-10-16 DIAGNOSIS — L678 Other hair color and hair shaft abnormalities: Secondary | ICD-10-CM

## 2013-10-16 DIAGNOSIS — R7301 Impaired fasting glucose: Secondary | ICD-10-CM

## 2013-10-16 DIAGNOSIS — L739 Follicular disorder, unspecified: Secondary | ICD-10-CM

## 2013-10-16 DIAGNOSIS — R252 Cramp and spasm: Secondary | ICD-10-CM

## 2013-10-16 DIAGNOSIS — F430 Acute stress reaction: Secondary | ICD-10-CM

## 2013-10-16 DIAGNOSIS — I10 Essential (primary) hypertension: Secondary | ICD-10-CM

## 2013-10-16 LAB — BASIC METABOLIC PANEL WITH GFR
BUN: 10 mg/dL (ref 6–23)
CHLORIDE: 105 meq/L (ref 96–112)
CO2: 27 meq/L (ref 19–32)
Calcium: 8.8 mg/dL (ref 8.4–10.5)
Creat: 0.76 mg/dL (ref 0.50–1.10)
GFR, Est African American: >89 mL/min
Glucose, Bld: 97 mg/dL (ref 70–99)
Potassium: 4.3 mEq/L (ref 3.5–5.3)
Sodium: 141 mEq/L (ref 135–145)

## 2013-10-16 LAB — POCT GLYCOSYLATED HEMOGLOBIN (HGB A1C): Hemoglobin A1C: 6.3

## 2013-10-16 MED ORDER — METOPROLOL SUCCINATE ER 25 MG PO TB24: ORAL_TABLET | ORAL | Status: DC

## 2013-10-16 MED ORDER — DOXYCYCLINE HYCLATE 100 MG PO TABS: 100.0000 mg | ORAL_TABLET | Freq: Every day | ORAL | Status: DC

## 2013-10-16 MED ORDER — LOSARTAN POTASSIUM-HCTZ 100-12.5 MG PO TABS: ORAL_TABLET | ORAL | Status: DC

## 2013-10-16 MED ORDER — LOSARTAN POTASSIUM-HCTZ 50-12.5 MG PO TABS: 1.0000 | ORAL_TABLET | Freq: Every day | ORAL | Status: DC

## 2013-10-16 NOTE — Progress Notes (Signed)
Spoke w/Kimberly @ [facility] and she will d/c the losartan-hctz 100-12.5.Megan [name] [place]

## 2013-10-16 NOTE — Progress Notes (Signed)
   Subjective:    Patient ID: Megan [name], female    DOB: 27-Jun-1970, 43 y.o.   MRN: [id number]  HPI Hypertension- Pt denies chest pain, SOB, dizziness, or heart palpitations.  Taking meds as directed w/o problems.  Denies medication side effects.    IFG - Has lost a few more lbs but thinks it is bc she is stressed.  No regular exercise bc of her back. She is supposed to get surgery on her back but has been delaying bc of her work schedule.    Get inflammed follicles in axilla. Started a few months ago. Says her bras are fitting well. Uses same razor for month at a time. Shaves daily.  Getting small abscesses but they usually resolve on their own.  Noticed ore underarm odor than usual.   She is stressed out. Her job is really stressful. Has been in this posotion in a year. She feels overwhelmed at time. Has a hard time letting it go once home.  Says she will have to set away from situations. She hasn't had any outburst at work but says she tends to hold thing in and then explodes and feels better.     Leg cramping for several weeks. Bilat. Can happen even standing in kitchen washing dishes but worse at night.  No known triggers. No worsening or alleviating factors. Says feel more severe than regular "charlie horse cramps".   Review of Systems     Objective:   Physical Exam  Constitutional: She is oriented to person, place, and time. She appears well-developed and well-nourished.  HENT:  Head: Normocephalic and atraumatic.  Cardiovascular: Normal rate, regular rhythm and normal heart sounds.   Pulmonary/Chest: Effort normal and breath sounds normal.  Neurological: She is alert and oriented to person, place, and time.  Skin: Skin is warm and dry.  Psychiatric: She has a normal mood and affect. Her behavior is normal.          Assessment & Plan:  HTN - well controlled.  IN fact low today.  Consider decreasing dose of losratan to 28m.  Check kidney funciton and potassium since on  ARB/diuretic.   IFG - well controlled today. Continue to work on diet and weight loss. She has lost a few more pounds which is fanstastic.   Folliculitis - Will tx with low dose doxy - 1056mQD- for 2 weeks.  Discussed strategies to avoid/redcuce inflammation of the hair follicles.  Recommend use salicylic acid wash on underarms or benzyol peroxide wash if able to find it.    Acute situational stress/anxiety - Discussed strategies such as counseling and/or medications. Encouraged her to think about.    Leg cramps - could be from her back ( she is scheduled to have back surgery later this year) or from abnormal electrolytes. Will check BMP today.

## 2013-10-17 NOTE — Progress Notes (Signed)
Quick Note:  All labs are normal. ______

## 2013-11-13 ENCOUNTER — Ambulatory Visit: Payer: 59 | Admitting: Family Medicine

## 2013-11-21 ENCOUNTER — Ambulatory Visit (INDEPENDENT_AMBULATORY_CARE_PROVIDER_SITE_OTHER): Payer: 59 | Admitting: Family Medicine

## 2013-11-21 ENCOUNTER — Encounter: Payer: Self-pay | Admitting: Family Medicine

## 2013-11-21 VITALS — BP 140/86 | HR 85 | Wt 233.0 lb

## 2013-11-21 DIAGNOSIS — F430 Acute stress reaction: Secondary | ICD-10-CM

## 2013-11-21 DIAGNOSIS — Z6841 Body Mass Index (BMI) 40.0 and over, adult: Secondary | ICD-10-CM

## 2013-11-21 MED ORDER — PHENTERMINE-TOPIRAMATE ER 3.75-23 MG PO CP24: 1.0000 | ORAL_CAPSULE | Freq: Every day | ORAL | Status: DC

## 2013-11-21 MED ORDER — BUPROPION HCL ER (XL) 150 MG PO TB24: 150.0000 mg | ORAL_TABLET | ORAL | Status: DC

## 2013-11-21 NOTE — Progress Notes (Signed)
   Subjective:    Patient ID: Megan [name], female    DOB: Jun 29, 1970, 43 y.o.   MRN: [id number]  HPI Want to discuss weight loss options.  He definitely is not interested in surgery but wants to discuss medications as an option. She's is frustrated with her weight. She's never taken any prescription weight loss medications but has taken things over-the-counter before. She's not currently counting her calories and walks sometimes for exercise.  She is just feeling down and overwhelmed. She is really stressed.   She says normally she can push 3 things. If that works she is the person that other people become too and they have problems. She just feels overwhelmed and will start crying. She's having difficulty sleeping as her mind is racing at night while she is trying to fall asleep. She feels stressed at home and at work. She said on Monday she felt so stressed that she actually felt lightheaded and dizzy. She had to take a few minutes to sit down and rest. She feels like her blood pressure may be elevated that day as well secondary to stress.   Review of Systems     Objective:   Physical Exam  Constitutional: She is oriented to person, place, and time. She appears well-developed and well-nourished.  HENT:  Head: Normocephalic and atraumatic.  Cardiovascular: Normal rate, regular rhythm and normal heart sounds.   Pulmonary/Chest: Effort normal and breath sounds normal.  Neurological: She is alert and oriented to person, place, and time.  Skin: Skin is warm and dry.  Psychiatric: She has a normal mood and affect. Her behavior is normal.          Assessment & Plan:  Morbid obesity/BMI greater than 40-we discussed different treatment options and plan. For now she would prefer to try a weight loss medication. We discussed the importance of diet and exercise in addition to this. I recommended the Smart phone application called my fitness PAL to help her track calories and fat goals. She also  plans to walk daily for at least 15-20 minutes. We discussed similar versus nonsupine options. She felt like to see me would be the best fit for her. Prescription given for starter dose for the first [redacted] weeks along with a coupon card that should make this free. If she tolerates that well then will get her a 30 day supply of the 7.5 mg Qsymia dose. She will followup with me in 6 weeks for blood pressure and weight check and to evaluate how she's doing and to make sure that we are setting goals for weight loss including calorie count and exercise.  Acute situational anxiety-discussed different options. She really feels like she's not interested in counseling or therapy at this time. We discussed medication as an option. Will start Wellbutrin under 50 mg XL once a day. Discussed potential side effects. Followup in 6 weeks. Can adjust his at that time.   time spent 30 minutes, greater than 50% time spent in counseling about weight loss options and her anxiety and treatment options.

## 2014-01-02 ENCOUNTER — Ambulatory Visit (INDEPENDENT_AMBULATORY_CARE_PROVIDER_SITE_OTHER): Payer: 59 | Admitting: Family Medicine

## 2014-01-02 ENCOUNTER — Encounter: Payer: Self-pay | Admitting: Family Medicine

## 2014-01-02 VITALS — BP 114/71 | HR 89 | Ht 63.0 in | Wt 226.0 lb

## 2014-01-02 DIAGNOSIS — R635 Abnormal weight gain: Secondary | ICD-10-CM

## 2014-01-02 DIAGNOSIS — Z23 Encounter for immunization: Secondary | ICD-10-CM

## 2014-01-02 DIAGNOSIS — M722 Plantar fascial fibromatosis: Secondary | ICD-10-CM

## 2014-01-02 DIAGNOSIS — F430 Acute stress reaction: Secondary | ICD-10-CM

## 2014-01-02 MED ORDER — SERTRALINE HCL 50 MG PO TABS: ORAL_TABLET | ORAL | Status: DC

## 2014-01-02 MED ORDER — PHENTERMINE HCL 37.5 MG PO CAPS: 37.5000 mg | ORAL_CAPSULE | ORAL | Status: DC

## 2014-01-02 NOTE — Progress Notes (Addendum)
   Subjective:    Patient ID: Megan [name], female    DOB: 06-Jan-1971, 43 y.o.   MRN: [id number]  HPI Abnormal weight gain-this is her 6 week followup. We discussed different options. We started her on Qsymia. She is now on a maintenance dose. She has been trackin her calories and has lost 7 lbs.   Acute stress reaction-this is her 6 week followup for a new start of Wellbutrin 150 mg XL. No S.E and says she really hasn't noticed any improvement.  Still feel she job is really stressful.  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   Left plantar fasciitis. She says it has recurred. She had times with this about 5 years ago. Eventually she had a cortisone injection which relieved her pain and she has done well for 4 years until recently when she had worked in the kitchen area at work for 3 weeks and that's when her foot started hurting again. She has been doing the stretches at home and also wearing some support. She doesn't wear the best she returned a week but says in the evenings and weekends she tends to get better with this.  Review of Systems     Objective:   Physical Exam  Constitutional: She is oriented to person, place, and time. She appears well-developed and well-nourished.  HENT:  Head: Normocephalic and atraumatic.  Cardiovascular: Normal rate, regular rhythm and normal heart sounds.   Pulmonary/Chest: Effort normal and breath sounds normal.  Neurological: She is alert and oriented to person, place, and time.  Skin: Skin is warm and dry.  Psychiatric: She has a normal mood and affect. Her behavior is normal.          Assessment & Plan:  Abnormal weight gain- Has lost 7 lbs.  Qsymia wasn' t covered on her plan. Will try pherntermine instead. Warned about potential side effects. She will followup in one month for blood pressure and weight check. I congratulated her on her 7 pound weight loss. Keep up the good work with diet and exercise.   Acute stress reaction-GAD- 7 score of 16.   Uncontrolled.  We will wean the Wellbutrin and start sertraline instead. Sounds like she's not getting any response at all to Wellbutrin. We'll see her back in one month so we can adjust her dose as needed.  Plantar fasciitis-discussed the importance of exercises and stretches. She can also try a night splint. She would like to have a cortisone injection so I'll refer her to my partner, Dr. Aundria Mems for cortisone injection.  Flu shot given today.

## 2014-01-08 ENCOUNTER — Ambulatory Visit (INDEPENDENT_AMBULATORY_CARE_PROVIDER_SITE_OTHER): Payer: 59 | Admitting: Sports Medicine

## 2014-01-08 ENCOUNTER — Encounter: Payer: Self-pay | Admitting: Sports Medicine

## 2014-01-08 VITALS — BP 147/92 | HR 93 | Ht 64.0 in | Wt 226.0 lb

## 2014-01-08 DIAGNOSIS — M722 Plantar fascial fibromatosis: Secondary | ICD-10-CM | POA: Insufficient documentation

## 2014-01-08 MED ORDER — DICLOFENAC SODIUM 75 MG PO TBEC: 75.0000 mg | DELAYED_RELEASE_TABLET | Freq: Two times a day (BID) | ORAL | Status: DC

## 2014-01-08 NOTE — Assessment & Plan Note (Signed)
Left worse than right. Per patient request we will treat interventionally on the left side. She will return for custom orthotics, I'm going to add Voltaren and rehabilitation exercises to be done bilaterally.

## 2014-01-08 NOTE — Progress Notes (Signed)
   Subjective:    I'm seeing this patient as a consultation for:  Dr. Madilyn Fireman  CC: Foot pain  HPI: This is a very pleasant 43 year old female with a history of a pars interarticularis defect with a long history of left worse than right heel pain, worse in the mornings, stabbing, localized the calcaneal insertion of the plantar fascia. She has had an injection in the distant past which was effective and she desires repeat interventional treatment on the left side. She does have some custom orthotics at home, rigid plastic, not worn today. Symptoms are severe, persistent.  Past medical history, Surgical history, Family history not pertinant except as noted below, Social history, Allergies, and medications have been entered into the medical record, reviewed, and no changes needed.   Review of Systems: No headache, visual changes, nausea, vomiting, diarrhea, constipation, dizziness, abdominal pain, skin rash, fevers, chills, night sweats, weight loss, swollen lymph nodes, body aches, joint swelling, muscle aches, chest pain, shortness of breath, mood changes, visual or auditory hallucinations.   Objective:   General: Well Developed, well nourished, and in no acute distress.  Neuro/Psych: Alert and oriented x3, extra-ocular muscles intact, able to move all 4 extremities, sensation grossly intact. Skin: Warm and dry, no rashes noted.  Respiratory: Not using accessory muscles, speaking in full sentences, trachea midline.  Cardiovascular: Pulses palpable, no extremity edema. Abdomen: Does not appear distended. Bilateral feet: No visible erythema or swelling. Range of motion is full in all directions. Strength is 5/5 in all directions. No hallux valgus. Bilateral pes cavus. No abnormal callus noted. No pain over the navicular prominence, or base of fifth metatarsal. Tender to palpation of the calcaneal insertion of plantar fascia. No pain at the Achilles insertion. No pain over the calcaneal  bursa. No pain of the retrocalcaneal bursa. No tenderness to palpation over the tarsals, metatarsals, or phalanges. No hallux rigidus or limitus. No tenderness palpation over interphalangeal joints. No pain with compression of the metatarsal heads. Neurovascularly intact distally.  Procedure: Real-time Ultrasound Guided Injection of left plantar fascia Device: GE Logiq E  Verbal informed consent obtained.  Time-out conducted.  Noted no overlying erythema, induration, or other signs of local infection.  Skin prepped in a sterile fashion.  Local anesthesia: Topical Ethyl chloride.  With sterile technique and under real time ultrasound guidance:  25-gauge needle advanced to the calcaneal region of the plantar fascia, 1 cc kenalog 40, 4 cc lidocaine injected easily. Completed without difficulty  Pain immediately resolved suggesting accurate placement of the medication.  Advised to call if fevers/chills, erythema, induration, drainage, or persistent bleeding.  Images permanently stored and available for review in the ultrasound unit.  Impression: Technically successful ultrasound guided injection.  Impression and Recommendations:   This case required medical decision making of moderate complexity.

## 2014-01-22 ENCOUNTER — Encounter: Payer: Self-pay | Admitting: Sports Medicine

## 2014-01-22 ENCOUNTER — Encounter: Payer: Self-pay | Admitting: Family Medicine

## 2014-01-22 ENCOUNTER — Ambulatory Visit (INDEPENDENT_AMBULATORY_CARE_PROVIDER_SITE_OTHER): Payer: 59 | Admitting: Sports Medicine

## 2014-01-22 VITALS — BP 148/93 | HR 104 | Ht 64.0 in | Wt 224.0 lb

## 2014-01-22 DIAGNOSIS — M722 Plantar fascial fibromatosis: Secondary | ICD-10-CM

## 2014-01-22 NOTE — Assessment & Plan Note (Signed)
Doing well after injection. Custom orthotics as above. Return in one month.

## 2014-01-22 NOTE — Progress Notes (Signed)

## 2014-01-24 ENCOUNTER — Other Ambulatory Visit: Payer: Self-pay | Admitting: Family Medicine

## 2014-01-24 MED ORDER — FLUOXETINE HCL 10 MG PO CAPS: ORAL_CAPSULE | ORAL | Status: DC

## 2014-01-29 ENCOUNTER — Ambulatory Visit: Payer: 59 | Admitting: Family Medicine

## 2014-02-06 ENCOUNTER — Encounter: Payer: Self-pay | Admitting: Family Medicine

## 2014-02-06 ENCOUNTER — Ambulatory Visit (INDEPENDENT_AMBULATORY_CARE_PROVIDER_SITE_OTHER): Payer: 59 | Admitting: Family Medicine

## 2014-02-06 VITALS — BP 135/87 | HR 86 | Ht 64.0 in | Wt 223.0 lb

## 2014-02-06 DIAGNOSIS — R635 Abnormal weight gain: Secondary | ICD-10-CM

## 2014-02-06 DIAGNOSIS — I10 Essential (primary) hypertension: Secondary | ICD-10-CM

## 2014-02-06 DIAGNOSIS — N951 Menopausal and female climacteric states: Secondary | ICD-10-CM

## 2014-02-06 DIAGNOSIS — D509 Iron deficiency anemia, unspecified: Secondary | ICD-10-CM

## 2014-02-06 DIAGNOSIS — F411 Generalized anxiety disorder: Secondary | ICD-10-CM

## 2014-02-06 DIAGNOSIS — R232 Flushing: Secondary | ICD-10-CM

## 2014-02-06 MED ORDER — PHENTERMINE HCL 37.5 MG PO CAPS: 37.5000 mg | ORAL_CAPSULE | ORAL | Status: DC

## 2014-02-06 NOTE — Progress Notes (Signed)
Subjective:    Patient ID: Megan [name], female    DOB: 1970-09-13, 43 y.o.   MRN: [id number]  HPI Anxiety - Says the sertraline when increased to 2 tabs  was starting to help but was causing her to sweat. She says starts suddenly. Says felt so hot the other day she thought she migh tpass out.  No CP or SOB.   She stopped the phentermine and sertraine x 1 week.  She did start the fluoxetine about 5 days ago.   Abnormal weight gain - she says occ will feel her heart race but rare.  She has been very sweaty.  No insomnia on th emedication.  Review of Systems  Does have prior history of iron def anemia during pregnancy.   BP 135/87  Pulse 86  Ht 5' 4"  (1.626 m)  Wt 223 lb (101.152 kg)  BMI 38.26 kg/m2    Allergies  Allergen Reactions  . Metformin And Related Other (See Comments)    GI upset   . Sertraline Nausea Only    sweats    Past Medical History  Diagnosis Date  . Gestational diabetes mellitus in childbirth     Past Surgical History  Procedure Laterality Date  . Tonsillectomy and adenoidectomy    . Tympanostomy tube placement  1984    History   Social History  . Marital Status: Single    Spouse Name: Aldean Suddeth    Number of Children: N/A  . Years of Education: N/A   Occupational History  . Asst Director [employer] History Main Topics  . Smoking status: Never Smoker   . Smokeless tobacco: Not on file  . Alcohol Use: No  . Drug Use: No  . Sexual Activity: Yes    Partners: Male   Other Topics Concern  . Not on file   Social History Narrative   2 caffeine drinks per day. No regular exercise. She works full-time and is going to school.    Family History  Problem Relation Age of Onset  . Hypertension Mother   . Hypertension Father   . Hypertension Sister   . Hypertension Brother   . Peripheral vascular disease Maternal Grandmother     Outpatient Encounter Prescriptions as of 02/06/2014  Medication Sig  .  cyclobenzaprine (FLEXERIL) 10 MG tablet Take 10 mg by mouth 3 (three) times daily as needed.  . diclofenac (VOLTAREN) 75 MG EC tablet Take 1 tablet (75 mg total) by mouth 2 (two) times daily.  Marland Kitchen FLUoxetine (PROZAC) 10 MG capsule One a day x 5 days, then 2 a day.  Marland Kitchen HYDROcodone-acetaminophen (NORCO/VICODIN) 5-325 MG per tablet   . losartan-hydrochlorothiazide (HYZAAR) 50-12.5 MG per tablet Take 1 tablet by mouth daily.  . metoprolol succinate (TOPROL-XL) 25 MG 24 hr tablet TAKE ONE TABLET BY MOUTH ONE TIME DAILY  . norgestrel-ethinyl estradiol (LO/OVRAL,CRYSELLE) 0.3-30 MG-MCG tablet Take 1 tablet by mouth daily.  . phentermine 37.5 MG capsule Take 1 capsule (37.5 mg total) by mouth every morning.  Marland Kitchen tiZANidine (ZANAFLEX) 4 MG capsule   . [DISCONTINUED] phentermine 37.5 MG capsule Take 1 capsule (37.5 mg total) by mouth every morning.  . [DISCONTINUED] buPROPion (WELLBUTRIN XL) 150 MG 24 hr tablet   . [DISCONTINUED] sertraline (ZOLOFT) 50 MG tablet           Objective:   Physical Exam  Constitutional: She is oriented to person, place, and time. She appears well-developed and well-nourished.  HENT:  Head: Normocephalic and atraumatic.  Neck: Neck supple. No thyromegaly present.  Cardiovascular: Normal rate, regular rhythm and normal heart sounds.   Pulmonary/Chest: Effort normal and breath sounds normal.  Lymphadenopathy:    She has no cervical adenopathy.  Neurological: She is alert and oriented to person, place, and time.  Skin: Skin is warm and dry.  Psychiatric: She has a normal mood and affect. Her behavior is normal.          Assessment & Plan:  GAD- Uncontrolled. GAD- 7 score of 20. started fluoxetine.  Dong well so far.  She is now taking it anight and using the phentermine in the AM.  Consider checking TSh for the feeling hot and having sweats.    Abnormal weight gain - Has lost 3 more lbs.  Still workin out.  Refill phentermine.  BP at goal. F/U in 1 months.   Hot  flashes- Will check TSH, CBC, etc. Could have been from combo of phentermine and sertraline.  Thought she stopped both for a week and still experienced the hotflashes.

## 2014-02-07 LAB — CBC WITH DIFFERENTIAL/PLATELET
BASOS ABS: 0 10*3/uL (ref 0.0–0.1)
BASOS PCT: 0 % (ref 0–1)
Eosinophils Absolute: 0.1 10*3/uL (ref 0.0–0.7)
Eosinophils Relative: 1 % (ref 0–5)
HCT: 41.3 % (ref 36.0–46.0)
HEMOGLOBIN: 14.3 g/dL (ref 12.0–15.0)
LYMPHS PCT: 21 % (ref 12–46)
Lymphs Abs: 2.1 10*3/uL (ref 0.7–4.0)
MCH: 29.9 pg (ref 26.0–34.0)
MCHC: 34.6 g/dL (ref 30.0–36.0)
MCV: 86.2 fL (ref 78.0–100.0)
Monocytes Absolute: 0.8 10*3/uL (ref 0.1–1.0)
Monocytes Relative: 8 % (ref 3–12)
NEUTROS ABS: 6.9 10*3/uL (ref 1.7–7.7)
Neutrophils Relative %: 70 % (ref 43–77)
Platelets: 306 10*3/uL (ref 150–400)
RBC: 4.79 MIL/uL (ref 3.87–5.11)
RDW: 13.7 % (ref 11.5–15.5)
WBC: 9.8 10*3/uL (ref 4.0–10.5)

## 2014-02-07 LAB — BASIC METABOLIC PANEL WITH GFR
BUN: 16 mg/dL (ref 6–23)
CALCIUM: 9.3 mg/dL (ref 8.4–10.5)
CO2: 24 mEq/L (ref 19–32)
CREATININE: 0.88 mg/dL (ref 0.50–1.10)
Chloride: 104 mEq/L (ref 96–112)
GFR, EST NON AFRICAN AMERICAN: 82 mL/min
Glucose, Bld: 101 mg/dL — ABNORMAL HIGH (ref 70–99)
Potassium: 4 mEq/L (ref 3.5–5.3)
Sodium: 141 mEq/L (ref 135–145)

## 2014-02-07 LAB — FERRITIN: FERRITIN: 165 ng/mL (ref 10–291)

## 2014-02-07 LAB — TSH: TSH: 1.528 u[IU]/mL (ref 0.350–4.500)

## 2014-02-10 NOTE — Progress Notes (Signed)
Quick Note:  All labs are normal. ______

## 2014-02-19 ENCOUNTER — Ambulatory Visit (INDEPENDENT_AMBULATORY_CARE_PROVIDER_SITE_OTHER): Payer: 59 | Admitting: Sports Medicine

## 2014-02-19 ENCOUNTER — Encounter: Payer: Self-pay | Admitting: Sports Medicine

## 2014-02-19 VITALS — BP 138/82 | HR 81 | Wt 223.0 lb

## 2014-02-19 DIAGNOSIS — M722 Plantar fascial fibromatosis: Secondary | ICD-10-CM

## 2014-02-19 DIAGNOSIS — M4306 Spondylolysis, lumbar region: Secondary | ICD-10-CM

## 2014-02-19 NOTE — Progress Notes (Signed)
  Subjective:    CC: Followup  HPI:  Plantar fasciitis: Completely resolved with injection and custom orthotics as well as rehabilitation exercises.  Past medical history, Surgical history, Family history not pertinant except as noted below, Social history, Allergies, and medications have been entered into the medical record, reviewed, and no changes needed.   Review of Systems: No fevers, chills, night sweats, weight loss, chest pain, or shortness of breath.   Objective:    General: Well Developed, well nourished, and in no acute distress.  Neuro: Alert and oriented x3, extra-ocular muscles intact, sensation grossly intact.  HEENT: Normocephalic, atraumatic, pupils equal round reactive to light, neck supple, no masses, no lymphadenopathy, thyroid nonpalpable.  Skin: Warm and dry, no rashes. Cardiac: Regular rate and rhythm, no murmurs rubs or gallops, no lower extremity edema.  Respiratory: Clear to auscultation bilaterally. Not using accessory muscles, speaking in full sentences. Bilateral Foot: No visible erythema or swelling. Range of motion is full in all directions. Strength is 5/5 in all directions. No hallux valgus. No pes cavus or pes planus. No abnormal callus noted. No pain over the navicular prominence, or base of fifth metatarsal. No tenderness to palpation of the calcaneal insertion of plantar fascia. No pain at the Achilles insertion. No pain over the calcaneal bursa. No pain of the retrocalcaneal bursa. No tenderness to palpation over the tarsals, metatarsals, or phalanges. No hallux rigidus or limitus. No tenderness palpation over interphalangeal joints. No pain with compression of the metatarsal heads. Neurovascularly intact distally.  Impression and Recommendations:

## 2014-02-19 NOTE — Assessment & Plan Note (Signed)
Plantar fasciitis is completely resolved with custom orthotics, rehabilitation exercises, and a single injection.  Return as needed.

## 2014-02-19 NOTE — Assessment & Plan Note (Signed)
I have not evaluate this however it seems as though she has had epidurals and facet injections as well as radiofrequency ablations without improvement. She tells me she is scheduled for surgery.

## 2014-02-24 ENCOUNTER — Telehealth: Payer: Self-pay

## 2014-02-24 ENCOUNTER — Encounter: Payer: Self-pay | Admitting: Family Medicine

## 2014-02-24 NOTE — Telephone Encounter (Signed)
I agree

## 2014-02-24 NOTE — Telephone Encounter (Signed)
Megan [name] describes dizzy episodes multiple times daily starting yesterday. She reports dizziness, nausea and sweating. Patient advised to have someone drive her to the [facility].

## 2014-02-25 ENCOUNTER — Encounter: Payer: Self-pay | Admitting: Family Medicine

## 2014-02-25 ENCOUNTER — Ambulatory Visit (INDEPENDENT_AMBULATORY_CARE_PROVIDER_SITE_OTHER): Payer: 59 | Admitting: Family Medicine

## 2014-02-25 VITALS — BP 164/104 | HR 99 | Temp 97.7°F | Wt 220.0 lb

## 2014-02-25 DIAGNOSIS — H8111 Benign paroxysmal vertigo, right ear: Secondary | ICD-10-CM

## 2014-02-25 MED ORDER — MECLIZINE HCL 25 MG PO TABS: 25.0000 mg | ORAL_TABLET | Freq: Three times a day (TID) | ORAL | Status: DC | PRN

## 2014-02-25 NOTE — Progress Notes (Signed)
   Subjective:    Patient ID: Megan [name], female    DOB: 03-16-1971, 43 y.o.   MRN: [id number]  HPI  Dizziness started 2 days ago.  Feels like her eye are shaking in her head. Some mild nausea.  Some pain at the back of her neck and radiates into her shoulder. Work up with HA today.  No fever, chills or sweats. Dizziness last 1-2 minutes. She had called yesterday and we urged her to go to UC.  She didn't go.  No ear pain or pressure. No ear ringing  Or hearing loss.  Never had this before.    Review of Systems     Objective:   Physical Exam  Constitutional: She is oriented to person, place, and time. She appears well-developed and well-nourished.  HENT:  Head: Normocephalic and atraumatic.  Right Ear: External ear normal.  Left Ear: External ear normal.  Nose: Nose normal.  Mouth/Throat: Oropharynx is clear and moist.  TMs and canals are clear.   Eyes: Conjunctivae and EOM are normal. Pupils are equal, round, and reactive to light.  Neck: Neck supple. No thyromegaly present.  Cardiovascular: Normal rate, regular rhythm and normal heart sounds.   Pulmonary/Chest: Effort normal and breath sounds normal. She has no wheezes.  Musculoskeletal:  Positive Diks-Hallpike Manuever on the right.   Lymphadenopathy:    She has no cervical adenopathy.  Neurological: She is alert and oriented to person, place, and time. No cranial nerve deficit. Coordination normal.  Skin: Skin is warm and dry.  Psychiatric: She has a normal mood and affect. Her behavior is normal.          Assessment & Plan:  BPV - Discussed dx. Can use meclizine.  Given H.O on eply manuver exercises If not better in 1-2 weeks then call and will refer to PT.

## 2014-02-25 NOTE — Patient Instructions (Signed)
Benign Positional Vertigo Vertigo means you feel like you or your surroundings are moving when they are not. Benign positional vertigo is the most common form of vertigo. Benign means that the cause of your condition is not serious. Benign positional vertigo is more common in older adults. CAUSES  Benign positional vertigo is the result of an upset in the labyrinth system. This is an area in the middle ear that helps control your balance. This may be caused by a viral infection, head injury, or repetitive motion. However, often no specific cause is found. SYMPTOMS  Symptoms of benign positional vertigo occur when you move your head or eyes in different directions. Some of the symptoms may include:  Loss of balance and falls.  Vomiting.  Blurred vision.  Dizziness.  Nausea.  Involuntary eye movements (nystagmus). DIAGNOSIS  Benign positional vertigo is usually diagnosed by physical exam. If the specific cause of your benign positional vertigo is unknown, your caregiver may perform imaging tests, such as magnetic resonance imaging (MRI) or computed tomography (CT). TREATMENT  Your caregiver may recommend movements or procedures to correct the benign positional vertigo. Medicines such as meclizine, benzodiazepines, and medicines for nausea may be used to treat your symptoms. In rare cases, if your symptoms are caused by certain conditions that affect the inner ear, you may need surgery. HOME CARE INSTRUCTIONS   Follow your caregiver's instructions.  Move slowly. Do not make sudden body or head movements.  Avoid driving.  Avoid operating heavy machinery.  Avoid performing any tasks that would be dangerous to you or others during a vertigo episode.  Drink enough fluids to keep your urine clear or pale yellow. SEEK IMMEDIATE MEDICAL CARE IF:   You develop problems with walking, weakness, numbness, or using your arms, hands, or legs.  You have difficulty speaking.  You develop  severe headaches.  Your nausea or vomiting continues or gets worse.  You develop visual changes.  Your family or friends notice any behavioral changes.  Your condition gets worse.  You have a fever.  You develop a stiff neck or sensitivity to light. MAKE SURE YOU:   Understand these instructions.  Will watch your condition.  Will get help right away if you are not doing well or get worse. Document Released: 01/31/2006 Document Revised: 07/18/2011 Document Reviewed: 01/13/2011 [facility] Patient Information 2015 [place], [place]. This information is not intended to replace advice given to you by your health care provider. Make sure you discuss any questions you have with your health care provider.

## 2014-02-28 ENCOUNTER — Encounter: Payer: Self-pay | Admitting: Family Medicine

## 2014-03-06 ENCOUNTER — Ambulatory Visit (INDEPENDENT_AMBULATORY_CARE_PROVIDER_SITE_OTHER): Payer: 59 | Admitting: Family Medicine

## 2014-03-06 ENCOUNTER — Other Ambulatory Visit: Payer: Self-pay | Admitting: *Deleted

## 2014-03-06 ENCOUNTER — Encounter: Payer: Self-pay | Admitting: Family Medicine

## 2014-03-06 VITALS — BP 110/79 | HR 88 | Ht 64.0 in | Wt 220.0 lb

## 2014-03-06 DIAGNOSIS — H8111 Benign paroxysmal vertigo, right ear: Secondary | ICD-10-CM

## 2014-03-06 DIAGNOSIS — F411 Generalized anxiety disorder: Secondary | ICD-10-CM

## 2014-03-06 DIAGNOSIS — R635 Abnormal weight gain: Secondary | ICD-10-CM

## 2014-03-06 MED ORDER — FLUOXETINE HCL 40 MG PO CAPS: 40.0000 mg | ORAL_CAPSULE | Freq: Every day | ORAL | Status: DC

## 2014-03-06 MED ORDER — PHENTERMINE HCL 37.5 MG PO CAPS: 37.5000 mg | ORAL_CAPSULE | ORAL | Status: DC

## 2014-03-06 NOTE — Progress Notes (Signed)
   Subjective:    Patient ID: Megan [name], female    DOB: 1970/09/23, 43 y.o.   MRN: [id number]  HPI Here for follow-up anxiety-still feeling nervous and on average over half the days and having difficulty controlling her worry. Chest relatively relaxing. Currently taking 20 mg of fluoxetine.   Abnormal weight gain - She is doing well on the phentermine.  No S.e. She is down 3 lbs in the last month.  No chest pain, short of breath, palpitations on the medication.  BPV- Still having dizziness. Says not as intense.  Says still "hits" her throught the day. Worse ehen turns the right and when tilts her head back or bends forward to tie her shoe  Worse when lays down in bed.  Review of Systems     Objective:   Physical Exam  Constitutional: She is oriented to person, place, and time. She appears well-developed and well-nourished.  HENT:  Head: Normocephalic and atraumatic.  Cardiovascular: Normal rate, regular rhythm and normal heart sounds.   Pulmonary/Chest: Effort normal and breath sounds normal.  Neurological: She is alert and oriented to person, place, and time.  Skin: Skin is warm and dry.  Psychiatric: She has a normal mood and affect. Her behavior is normal.          Assessment & Plan:  Anxiety-7 Score 15 Today, Previous Was 21. She Rates Her Symptoms As Not Difficult at All. We discussed options today. Will increase the fluoxetine to 40 mg. Follow-up in 6 weeks. She hasn't palms or side effects or concerns in please call or let us know. Otherwise she is tolerating it well so far and I do think it's reduced her symptoms by about 25%. Next  Abnormal weight gain-she's lost 3 more pounds in the phentermine. Will refill today. Follow-up in one month for blood pressure and weight check with the nurse.    Benign positional vertigo-she has had some improvement in symptoms. Is not nearly as intense and does not last as long. Encouraged her to continue with the exercises and use the  meclizine as needed. If it's not continuing to improve over the next 7-10 days and please call me back and I'll put her in physical therapy for vestibular rehabilitation.

## 2014-04-02 ENCOUNTER — Other Ambulatory Visit: Payer: Self-pay | Admitting: Family Medicine

## 2014-04-21 ENCOUNTER — Encounter: Payer: Self-pay | Admitting: Family Medicine

## 2014-04-21 ENCOUNTER — Ambulatory Visit (INDEPENDENT_AMBULATORY_CARE_PROVIDER_SITE_OTHER): Payer: 59 | Admitting: Family Medicine

## 2014-04-21 VITALS — BP 138/81 | HR 109 | Ht 64.0 in | Wt 221.0 lb

## 2014-04-21 DIAGNOSIS — R635 Abnormal weight gain: Secondary | ICD-10-CM

## 2014-04-21 DIAGNOSIS — R7301 Impaired fasting glucose: Secondary | ICD-10-CM

## 2014-04-21 DIAGNOSIS — Z6841 Body Mass Index (BMI) 40.0 and over, adult: Secondary | ICD-10-CM

## 2014-04-21 DIAGNOSIS — E282 Polycystic ovarian syndrome: Secondary | ICD-10-CM

## 2014-04-21 DIAGNOSIS — F430 Acute stress reaction: Secondary | ICD-10-CM

## 2014-04-21 LAB — POCT GLYCOSYLATED HEMOGLOBIN (HGB A1C): HEMOGLOBIN A1C: 6.3

## 2014-04-21 MED ORDER — PHENTERMINE HCL 37.5 MG PO CAPS: 37.5000 mg | ORAL_CAPSULE | ORAL | Status: DC

## 2014-04-21 NOTE — Progress Notes (Signed)
   Subjective:    Patient ID: Megan [name], female    DOB: 1971-03-29, 43 y.o.   MRN: [id number]  HPI six-week follow-up for anxiety-when I last saw her we increased her fluoxetine to 40 mg [id number].  Tolerating new dose well. She does complain of trouble relaxing and irritability. She feels like she worries too much about things. Work has been very stressful for her. They have been short staffed and she has been trying to hire several new employees.  Abnormal weight gain-she's currently on phentermine. She has actually gained a pound since last here on the phentermine.  She has been out of med for about 2 weeks.  Hasn't been able to work out as more.  o CPE or heart palps. No insomnia.  Has lost total of 20 bs in the last month.   BPV- Seems to be improving. I had asked her to call me after a week if felt like wanted to do rehab.  Says it is 99% better.    IFG/PCOS- she is taking metformin.  Review of Systems     Objective:   Physical Exam  Constitutional: She is oriented to person, place, and time. She appears well-developed and well-nourished.  HENT:  Head: Normocephalic and atraumatic.  Cardiovascular: Normal rate, regular rhythm and normal heart sounds.   Pulmonary/Chest: Effort normal and breath sounds normal.  Neurological: She is alert and oriented to person, place, and time.  Skin: Skin is warm and dry.  Psychiatric: She has a normal mood and affect. Her behavior is normal.          Assessment & Plan:  Acute situational stress reaction- 7 score of 11. Previous was 15. Some improvement. She's having that her stress levels will go down in the next few weeks as they are hiring some additional help at work. She works in Software engineer.  Abnormal weight gain - Will refill phentermine. She did gain a pound but we discussed the importance of making sure that she's gained back and her exercise routine which she has fallen off of them last couple weeks in addition to  counting her calories. I really want her to continue to be successful. She has lost about 20 pounds in the last year and I would like to see her lose about another 20 pounds over the next year.  BPV - Much improved. Will continue current regimen. If her symptoms return or suddenly get worse then please call and we will put her in vestibular rehabilitation through physical therapy.  IFG/PCOS - A1c is 6.3 today which is stable from previous 6 months. Will repeat again in 6 months to continue work on diet, exercise and weight loss. Continue metformin.

## 2014-05-16 ENCOUNTER — Other Ambulatory Visit: Payer: Self-pay | Admitting: Family Medicine

## 2014-05-19 ENCOUNTER — Ambulatory Visit (INDEPENDENT_AMBULATORY_CARE_PROVIDER_SITE_OTHER): Payer: 59 | Admitting: Family Medicine

## 2014-05-19 VITALS — BP 136/88 | HR 111 | Wt 219.0 lb

## 2014-05-19 DIAGNOSIS — E669 Obesity, unspecified: Secondary | ICD-10-CM

## 2014-05-19 DIAGNOSIS — R635 Abnormal weight gain: Secondary | ICD-10-CM

## 2014-05-19 MED ORDER — METOPROLOL SUCCINATE ER 25 MG PO TB24: 25.0000 mg | ORAL_TABLET | Freq: Every day | ORAL | Status: DC

## 2014-05-19 MED ORDER — PHENTERMINE HCL 37.5 MG PO CAPS: 37.5000 mg | ORAL_CAPSULE | ORAL | Status: DC

## 2014-05-19 NOTE — Progress Notes (Signed)
   Subjective:    Patient ID: Megan [name], female    DOB: [birth year], 44 y.o.   MRN: [id number]  HPI  Megan [name] is here for a weight and blood pressure check. Denies insomnia or palpitations.   Review of Systems     Objective:   Physical Exam        Assessment & Plan:  Obesity - Patient has lost weight. Down 2 pounds. A refill of phentermine will be sent to [facility]. Patient advised to follow up in 30 days.   Beatrice Lecher, MD

## 2014-06-19 ENCOUNTER — Ambulatory Visit (INDEPENDENT_AMBULATORY_CARE_PROVIDER_SITE_OTHER): Payer: 59 | Admitting: Family Medicine

## 2014-06-19 VITALS — BP 135/85 | HR 93 | Wt 216.0 lb

## 2014-06-19 DIAGNOSIS — R635 Abnormal weight gain: Secondary | ICD-10-CM

## 2014-06-19 MED ORDER — PHENTERMINE HCL 37.5 MG PO CAPS: 37.5000 mg | ORAL_CAPSULE | ORAL | Status: DC

## 2014-06-19 NOTE — Progress Notes (Signed)
   Subjective:    Patient ID: Megan [name], female    DOB: October 07, 1970, 44 y.o.   MRN: [id number]  HPI  Jazmarie is here for a blood pressure and weight check. Denies palpitations or trouble sleeping.   Vertigo- She reports the vertigo symptoms did resolve for about 2 months. The last 4 days milder symptoms such as the feeling of the room spinning and dizziness with head movements have returned.   Review of Systems     Objective:   Physical Exam        Assessment & Plan:  Abnormal weight gain - A weight loss of 3 pounds this month. A refill will be faxed to the pharmacy later today. Follow-up in one month with nurse visit for repeat pressure weight check. Next  Vertigo-can provide handout with exercises for benign positional vertigo. Also make sure hydrating well especially exercising.  Beatrice Lecher, MD

## 2014-06-24 ENCOUNTER — Encounter: Payer: Self-pay | Admitting: Family Medicine

## 2014-07-18 ENCOUNTER — Encounter: Payer: Self-pay | Admitting: Family Medicine

## 2014-07-18 ENCOUNTER — Telehealth: Payer: Self-pay

## 2014-07-18 ENCOUNTER — Ambulatory Visit (INDEPENDENT_AMBULATORY_CARE_PROVIDER_SITE_OTHER): Payer: 59 | Admitting: Family Medicine

## 2014-07-18 VITALS — BP 131/82 | HR 93 | Wt 216.0 lb

## 2014-07-18 DIAGNOSIS — R635 Abnormal weight gain: Secondary | ICD-10-CM

## 2014-07-18 DIAGNOSIS — E119 Type 2 diabetes mellitus without complications: Secondary | ICD-10-CM

## 2014-07-18 DIAGNOSIS — R7301 Impaired fasting glucose: Secondary | ICD-10-CM

## 2014-07-18 DIAGNOSIS — B070 Plantar wart: Secondary | ICD-10-CM

## 2014-07-18 DIAGNOSIS — Z6841 Body Mass Index (BMI) 40.0 and over, adult: Secondary | ICD-10-CM

## 2014-07-18 LAB — POCT GLYCOSYLATED HEMOGLOBIN (HGB A1C): HEMOGLOBIN A1C: 6.9

## 2014-07-18 MED ORDER — PHENTERMINE HCL 37.5 MG PO CAPS: 37.5000 mg | ORAL_CAPSULE | ORAL | Status: DC

## 2014-07-18 MED ORDER — DULAGLUTIDE 0.75 MG/0.5ML ~~LOC~~ SOAJ: 0.7500 mg | SUBCUTANEOUS | Status: DC

## 2014-07-18 NOTE — Progress Notes (Signed)
   Subjective:    Patient ID: Megan [name], female    DOB: 07-25-1970, 44 y.o.   MRN: [id number]  HPI  Says her abnormal weight gain - she hasn't been able to work out th elast few weeks. Says had warts on her feet and had to work overtime.  Had a state visit at work. Son started baseball season.  No Cp ro SOB or palps when take the medication.   She did stop her fluoxetine. She felt liks she didn't really want to rely on this. She says she does still have the prescription so she may restart it if she feels like she needs to.   Warts on foot. They're near the ball of her foot. She mentioned before but wanted me to look at them again today. She wants and if an over-the-counter treatment would be acceptable. She really doesn't want to do cryotherapy at this time.  Review of Systems     Objective:   Physical Exam  Constitutional: She is oriented to person, place, and time. She appears well-developed and well-nourished.  HENT:  Head: Normocephalic and atraumatic.  Cardiovascular: Normal rate, regular rhythm and normal heart sounds.   Pulmonary/Chest: Effort normal and breath sounds normal.  Neurological: She is alert and oriented to person, place, and time.  Skin: Skin is warm and dry.  On the ball of her foot she has 2 small plantar warts.   Psychiatric: She has a normal mood and affect. Her behavior is normal.          Assessment & Plan:  Abnormal weight gain - will go ahead and refill the phentermine this month. Encouraged her to get back on track with diet and exercise and set some goals for herself. And follow-up in one month.  Diabetes - new diagnosis. has tried metformin in the past but had a lot of GI upset even a low dose. She wants to know what other options are available. A1C is 6.9 today. We will start her on treatment with CT which should also help with weight loss. We will recheck in 3 months.  Warts on foot-recommend a trial of over-the-counter salicylic acid. She really  doesn't want to do cryotherapy at this time.

## 2014-07-18 NOTE — Telephone Encounter (Signed)
Received fax for pa on trulicity sent through cover my meds waiting on auth. - CF

## 2014-07-22 MED ORDER — LIRAGLUTIDE 18 MG/3ML ~~LOC~~ SOPN: 0.6000 mg | PEN_INJECTOR | Freq: Every day | SUBCUTANEOUS | Status: DC

## 2014-07-22 NOTE — Telephone Encounter (Signed)
Received Megan [name] [id number] good until 07/22/2015 for Victoza. -CF

## 2014-07-22 NOTE — Telephone Encounter (Signed)
Received Pa for victoza sent through cover my meds waiting on auth. - CF

## 2014-07-23 ENCOUNTER — Other Ambulatory Visit: Payer: Self-pay

## 2014-07-23 MED ORDER — LIRAGLUTIDE 18 MG/3ML ~~LOC~~ SOPN: 0.6000 mg | PEN_INJECTOR | Freq: Every day | SUBCUTANEOUS | Status: DC

## 2014-08-06 ENCOUNTER — Encounter: Payer: Self-pay | Admitting: Family Medicine

## 2014-08-07 ENCOUNTER — Other Ambulatory Visit: Payer: Self-pay | Admitting: Family Medicine

## 2014-08-11 ENCOUNTER — Ambulatory Visit (INDEPENDENT_AMBULATORY_CARE_PROVIDER_SITE_OTHER): Payer: 59 | Admitting: Family Medicine

## 2014-08-11 VITALS — BP 137/85 | HR 104 | Ht 64.0 in | Wt 216.0 lb

## 2014-08-11 DIAGNOSIS — R635 Abnormal weight gain: Secondary | ICD-10-CM

## 2014-08-11 MED ORDER — PHENTERMINE HCL 37.5 MG PO CAPS: 37.5000 mg | ORAL_CAPSULE | ORAL | Status: DC

## 2014-08-11 NOTE — Progress Notes (Signed)
   Subjective:    Patient ID: Megan [name], female    DOB: 1970-09-02, 44 y.o.   MRN: [id number]  HPI  Patient is here for blood pressure and weight check. Denies any trouble sleeping, palpitations, or any other medication problems. Patient states she has not made any dietary or exercise changes.    Review of Systems     Objective:   Physical Exam        Assessment & Plan:  Patient weighs the same as last visit. Will route to PCP for review if refill for Phentermine will be sent to patient preferred pharmacy ([facility] in [place]). Patient advised to schedule a four week nurse visit and keep her upcoming appointment with her PCP. Verbalized understanding, no further questions.  Call patient: I will refill for 1 more month. She may need to readjust her caloric intake and adjust her exercise regimen. Beatrice Lecher, MD

## 2014-09-02 ENCOUNTER — Ambulatory Visit (INDEPENDENT_AMBULATORY_CARE_PROVIDER_SITE_OTHER): Payer: 59 | Admitting: Family Medicine

## 2014-09-02 ENCOUNTER — Ambulatory Visit: Payer: 59

## 2014-09-02 VITALS — BP 116/80 | HR 88 | Wt 214.0 lb

## 2014-09-02 DIAGNOSIS — R635 Abnormal weight gain: Secondary | ICD-10-CM

## 2014-09-02 MED ORDER — PHENTERMINE HCL 37.5 MG PO CAPS: 37.5000 mg | ORAL_CAPSULE | ORAL | Status: DC

## 2014-09-02 NOTE — Progress Notes (Signed)
Patient ID: Megan [name], female   DOB: 12/18/1970, 44 y.o.   MRN: [id number]   Patient here today for blood pressure & weight check. Patient is doing ok on her weight loss   And blood pressure is good.

## 2014-09-02 NOTE — Addendum Note (Signed)
Addended by: Clarnce Flock E on: 09/02/2014 11:08 AM   Modules accepted: Level of Service

## 2014-09-02 NOTE — Progress Notes (Signed)
Has lost 2 more lbs. Will refill medication. F/U in 1 months for BP and weight check.   Beatrice Lecher, MD

## 2014-09-22 ENCOUNTER — Ambulatory Visit (INDEPENDENT_AMBULATORY_CARE_PROVIDER_SITE_OTHER): Payer: 59 | Admitting: Family Medicine

## 2014-09-22 ENCOUNTER — Encounter: Payer: Self-pay | Admitting: Family Medicine

## 2014-09-22 VITALS — BP 135/88 | HR 99 | Wt 212.0 lb

## 2014-09-22 DIAGNOSIS — I739 Peripheral vascular disease, unspecified: Secondary | ICD-10-CM

## 2014-09-22 DIAGNOSIS — R634 Abnormal weight loss: Secondary | ICD-10-CM

## 2014-09-22 DIAGNOSIS — E119 Type 2 diabetes mellitus without complications: Secondary | ICD-10-CM

## 2014-09-22 LAB — POCT UA - MICROALBUMIN
Albumin/Creatinine Ratio, Urine, POC: <30
Creatinine, POC: 200 mg/dL
Microalbumin Ur, POC: 10 mg/L

## 2014-09-22 LAB — POCT GLYCOSYLATED HEMOGLOBIN (HGB A1C): Hemoglobin A1C: 6.3

## 2014-09-22 MED ORDER — LIRAGLUTIDE 18 MG/3ML ~~LOC~~ SOPN: 0.6000 mg | PEN_INJECTOR | Freq: Every day | SUBCUTANEOUS | Status: DC

## 2014-09-22 NOTE — Progress Notes (Signed)
   Subjective:    Patient ID: Megan [name], female    DOB: 13-Mar-1971, 44 y.o.   MRN: [id number]  HPI Diabetes - no hypoglycemic events. No wounds or sores that are not healing well. No increased thirst or urination. Checking glucose at home. Taking medications as prescribed without any side effects. Has lost 4 more lbs on phentermine.  Also on victoza/. No S.e.   Last hemoglobin A1c was 6.9, 2 months ago. She has been able to exercise every other days.   Toes turn purple when they get cold. Not painful but some uncomfortable. They are cold right now.  Has been going on for some time.    Review of Systems     Objective:   Physical Exam  Constitutional: She is oriented to person, place, and time. She appears well-developed and well-nourished.  HENT:  Head: Normocephalic and atraumatic.  Cardiovascular: Normal rate, regular rhythm and normal heart sounds.   Pulmonary/Chest: Effort normal and breath sounds normal.  Musculoskeletal:  Toes are purples with good cap refill and cool to touch.  No skin breakdown.  More so on the left foot compared to the right.   Neurological: She is alert and oriented to person, place, and time.  Skin: Skin is warm and dry.  Psychiatric: She has a normal mood and affect. Her behavior is normal.          Assessment & Plan:  DM- Well controlled. Doing well on victoza.  She is on ARB. Encouraged her to get eye exam.  Foot exam performed today.  F/U  In 3 months.    Abnormal weight loss - She is doing well. Now down 4 lbs.  Doing well . Continue current regimen.    Vasoconstriction of toes - consider vasculitis such at Raynaud's.  Discussed potential dx. Discussed getting toes covered and warm. She has capri's and sandals on today.

## 2014-09-22 NOTE — Patient Instructions (Signed)
Encourage her to get yearly eye exam.

## 2014-10-22 ENCOUNTER — Other Ambulatory Visit: Payer: Self-pay | Admitting: Family Medicine

## 2014-10-28 ENCOUNTER — Telehealth: Payer: Self-pay

## 2014-10-28 NOTE — Telephone Encounter (Signed)
Called pt to discuss a compliance letter we received from her insurance stating that she has not been compliant with her ACE inhibitor, beta blocker, and diuretic medications. Left a VM at [phone] requesting a call back to discuss this matter.

## 2014-10-29 NOTE — Telephone Encounter (Signed)
Called pt discuss a compliance letter we received from her insurance stating that she has not been compliant with her ACE inhibitor, beta blocker, and diuretic medications. Pt stated that she has been taking these medications and that she was late picking up her refills due to the costs of these meds.

## 2014-12-23 ENCOUNTER — Ambulatory Visit (INDEPENDENT_AMBULATORY_CARE_PROVIDER_SITE_OTHER): Payer: 59 | Admitting: Family Medicine

## 2014-12-23 ENCOUNTER — Encounter: Payer: Self-pay | Admitting: Family Medicine

## 2014-12-23 VITALS — BP 132/86 | HR 92 | Ht 64.0 in | Wt 204.0 lb

## 2014-12-23 DIAGNOSIS — I10 Essential (primary) hypertension: Secondary | ICD-10-CM | POA: Diagnosis not present

## 2014-12-23 DIAGNOSIS — R635 Abnormal weight gain: Secondary | ICD-10-CM | POA: Diagnosis not present

## 2014-12-23 DIAGNOSIS — E785 Hyperlipidemia, unspecified: Secondary | ICD-10-CM | POA: Diagnosis not present

## 2014-12-23 DIAGNOSIS — E119 Type 2 diabetes mellitus without complications: Secondary | ICD-10-CM

## 2014-12-23 DIAGNOSIS — Z6835 Body mass index (BMI) 35.0-35.9, adult: Secondary | ICD-10-CM

## 2014-12-23 LAB — LIPID PANEL
CHOLESTEROL: 204 mg/dL — AB (ref 125–200)
HDL: 44 mg/dL — AB (ref >=46)
LDL Cholesterol: 134 mg/dL — ABNORMAL HIGH (ref <130)
TRIGLYCERIDES: 132 mg/dL (ref <150)
Total CHOL/HDL Ratio: 4.6 Ratio (ref <=5.0)
VLDL: 26 mg/dL (ref <30)

## 2014-12-23 LAB — COMPLETE METABOLIC PANEL WITH GFR
ALBUMIN: 4.1 g/dL (ref 3.6–5.1)
ALK PHOS: 80 U/L (ref 33–115)
ALT: 28 U/L (ref 6–29)
AST: 24 U/L (ref 10–30)
BILIRUBIN TOTAL: 0.5 mg/dL (ref 0.2–1.2)
BUN: 13 mg/dL (ref 7–25)
CO2: 26 mmol/L (ref 20–31)
CREATININE: 0.82 mg/dL (ref 0.50–1.10)
Calcium: 9.8 mg/dL (ref 8.6–10.2)
Chloride: 104 mmol/L (ref 98–110)
GFR, EST NON AFRICAN AMERICAN: 89 mL/min (ref >=60)
Glucose, Bld: 97 mg/dL (ref 65–99)
Potassium: 3.5 mmol/L (ref 3.5–5.3)
Sodium: 141 mmol/L (ref 135–146)
TOTAL PROTEIN: 6.9 g/dL (ref 6.1–8.1)

## 2014-12-23 LAB — POCT GLYCOSYLATED HEMOGLOBIN (HGB A1C): HEMOGLOBIN A1C: 6.2

## 2014-12-23 MED ORDER — LIRAGLUTIDE 18 MG/3ML ~~LOC~~ SOPN: 1.2000 mg | PEN_INJECTOR | Freq: Every day | SUBCUTANEOUS | Status: DC

## 2014-12-23 MED ORDER — CYCLOBENZAPRINE HCL 10 MG PO TABS: 10.0000 mg | ORAL_TABLET | Freq: Every evening | ORAL | Status: DC | PRN

## 2014-12-23 NOTE — Progress Notes (Signed)
   Subjective:    Patient ID: Megan [name], female    DOB: October 13, 1970, 44 y.o.   MRN: [id number]  HPI Diabetes - no hypoglycemic events. No wounds or sores that are not healing well. No increased thirst or urination. Checking glucose at home. Taking medications as prescribed without any side effects.  Abnormal weight gain - she is actually lost about 8 pounds in the last 3 months which is fantastic. She was previously on phentermine but is not currently. Has been working out some and has been watching her diet.  She would really like to lose  To under 200 lbs.    She is still considering back surgery in the spring so she is working a second job to save up regular medication.    Hypertension- Pt denies chest pain, SOB, dizziness, or heart palpitations.  Taking meds as directed w/o problems.  Denies medication side effects.     Review of Systems     Objective:   Physical Exam  Constitutional: She is oriented to person, place, and time. She appears well-developed and well-nourished.  HENT:  Head: Normocephalic and atraumatic.  Cardiovascular: Normal rate, regular rhythm and normal heart sounds.   Pulmonary/Chest: Effort normal and breath sounds normal.  Neurological: She is alert and oriented to person, place, and time.  Skin: Skin is warm and dry.  Psychiatric: She has a normal mood and affect. Her behavior is normal.          Assessment & Plan:  Diabetes-well-controlled. Hemoglobin A1c of 6.2 today. Due for routine labs.  Abnormal weight gain/BMI 35- down 8 lbs. We discussed options. Really work on diet and exercise.  Will increase her victoza to 1.20m.  F/U in 3 months   Hypertension- Pt denies chest pain, SOB, dizziness, or heart palpitations.  Taking meds as directed w/o problems.  Denies medication side effects.    Hyperlipidemia-due to recheck lipid panel. She really needs to consider starting a statin with the diagnosis of diabetes.   Discussed the need for pneumonia  vaccine and flu shot.

## 2014-12-24 MED ORDER — ATORVASTATIN CALCIUM 20 MG PO TABS: 20.0000 mg | ORAL_TABLET | Freq: Every day | ORAL | Status: DC

## 2014-12-24 NOTE — Addendum Note (Signed)
Addended by: Beatrice Lecher D on: 12/24/2014 02:01 PM   Modules accepted: Orders

## 2015-01-17 ENCOUNTER — Encounter: Payer: Self-pay | Admitting: Family Medicine

## 2015-01-19 ENCOUNTER — Other Ambulatory Visit: Payer: Self-pay | Admitting: Family Medicine

## 2015-01-20 MED ORDER — AMBULATORY NON FORMULARY MEDICATION: Status: DC

## 2015-01-21 ENCOUNTER — Telehealth: Payer: Self-pay | Admitting: Family Medicine

## 2015-01-21 NOTE — Telephone Encounter (Signed)
Please call her pharmacy. She said that any specific information on her perception for glucometer lancets and strips that was sent over. They were specific brand but we have no idea what is covered under her insurance.there are more than 100 different meters on the market. They will likely have a better idea what is covered than [place].

## 2015-01-21 NOTE — Telephone Encounter (Signed)
Called pt and lvm and informed her that she can either call her insurance co to find out what glucometer they will cover or she can go into the pharmacy and pick out a meter and see if it is covered.Megan [name]

## 2015-01-31 ENCOUNTER — Other Ambulatory Visit: Payer: Self-pay | Admitting: Family Medicine

## 2015-02-02 LAB — HM DIABETES EYE EXAM

## 2015-02-06 ENCOUNTER — Other Ambulatory Visit: Payer: Self-pay | Admitting: *Deleted

## 2015-02-06 MED ORDER — AMBULATORY NON FORMULARY MEDICATION: Status: DC

## 2015-03-25 ENCOUNTER — Ambulatory Visit (INDEPENDENT_AMBULATORY_CARE_PROVIDER_SITE_OTHER): Payer: 59 | Admitting: Family Medicine

## 2015-03-25 ENCOUNTER — Encounter: Payer: Self-pay | Admitting: Family Medicine

## 2015-03-25 VITALS — BP 127/71 | HR 100 | Ht 64.0 in | Wt 203.1 lb

## 2015-03-25 DIAGNOSIS — Z23 Encounter for immunization: Secondary | ICD-10-CM | POA: Diagnosis not present

## 2015-03-25 DIAGNOSIS — I10 Essential (primary) hypertension: Secondary | ICD-10-CM | POA: Diagnosis not present

## 2015-03-25 DIAGNOSIS — E119 Type 2 diabetes mellitus without complications: Secondary | ICD-10-CM

## 2015-03-25 DIAGNOSIS — Z6834 Body mass index (BMI) 34.0-34.9, adult: Secondary | ICD-10-CM | POA: Diagnosis not present

## 2015-03-25 DIAGNOSIS — M4306 Spondylolysis, lumbar region: Secondary | ICD-10-CM | POA: Diagnosis not present

## 2015-03-25 LAB — POCT GLYCOSYLATED HEMOGLOBIN (HGB A1C): Hemoglobin A1C: 6.2

## 2015-03-25 NOTE — Progress Notes (Signed)
   Subjective:    Patient ID: Megan [name], female    DOB: 1971-01-15, 44 y.o.   MRN: [id number]  HPI Diabetes - no hypoglycemic events. No wounds or sores that are not healing well. No increased thirst or urination. Checking glucose at home. Taking medications as prescribed without any side effects.  Hypertension- Pt denies chest pain, SOB, dizziness, or heart palpitations.  Taking meds as directed w/o problems.  Denies medication side effects.    BMI 34 - doing well. She is trying to work out and eating better.   Review of Systems     Objective:   Physical Exam  Constitutional: She is oriented to person, place, and time. She appears well-developed and well-nourished.  HENT:  Head: Normocephalic and atraumatic.  Cardiovascular: Normal rate, regular rhythm and normal heart sounds.   Pulmonary/Chest: Effort normal and breath sounds normal.  Neurological: She is alert and oriented to person, place, and time.  Skin: Skin is warm and dry.  Psychiatric: She has a normal mood and affect. Her behavior is normal.          Assessment & Plan:  DM- well controlled at 6.2. Continue current regimens.  Had her eye exam at [facility] a month ago.  Given Pneumovax 23 today. Declined flu vaccine.  HTN - well controlled.  F/U in 3 months.    BMI 34 - doing she is great.  She's actually lost about 17 pounds in the last year which is fantastic. Encouraged her just to continue to work on diet and exercise especially if she is having surgery in January. The more weight she can get off beforehand the more successful se will be postoperatively.  Lumbar pars defect-she continues to have persistent pain is actually meeting with her orthopedist again soon to discuss possible surgery in January.

## 2015-03-26 ENCOUNTER — Other Ambulatory Visit: Payer: Self-pay | Admitting: *Deleted

## 2015-03-26 MED ORDER — LOSARTAN POTASSIUM-HCTZ 50-12.5 MG PO TABS: 1.0000 | ORAL_TABLET | Freq: Every day | ORAL | Status: DC

## 2015-05-06 ENCOUNTER — Other Ambulatory Visit: Payer: Self-pay | Admitting: Family Medicine

## 2015-05-22 ENCOUNTER — Encounter: Payer: Self-pay | Admitting: Family Medicine

## 2015-05-26 MED ORDER — AMITRIPTYLINE HCL 25 MG PO TABS: 25.0000 mg | ORAL_TABLET | Freq: Every day | ORAL | Status: DC

## 2015-05-31 ENCOUNTER — Encounter: Payer: Self-pay | Admitting: Family Medicine

## 2015-06-04 ENCOUNTER — Ambulatory Visit (INDEPENDENT_AMBULATORY_CARE_PROVIDER_SITE_OTHER): Payer: 59 | Admitting: Family Medicine

## 2015-06-04 ENCOUNTER — Encounter: Payer: Self-pay | Admitting: Family Medicine

## 2015-06-04 VITALS — BP 133/76 | HR 82 | Ht 64.0 in | Wt 197.0 lb

## 2015-06-04 DIAGNOSIS — S01112A Laceration without foreign body of left eyelid and periocular area, initial encounter: Secondary | ICD-10-CM

## 2015-06-04 DIAGNOSIS — S161XXA Strain of muscle, fascia and tendon at neck level, initial encounter: Secondary | ICD-10-CM

## 2015-06-04 DIAGNOSIS — S40029A Contusion of unspecified upper arm, initial encounter: Secondary | ICD-10-CM

## 2015-06-04 MED ORDER — TIZANIDINE HCL 4 MG PO CAPS: 4.0000 mg | ORAL_CAPSULE | Freq: Three times a day (TID) | ORAL | Status: DC | PRN

## 2015-06-04 NOTE — Patient Instructions (Signed)
Recommend aleve 1-2 tabs twice a day for 7-10 days. Take with food and water.
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
## 2015-06-20 ENCOUNTER — Other Ambulatory Visit: Payer: Self-pay | Admitting: Family Medicine

## 2015-06-23 LAB — HM PAP SMEAR: HM Pap smear: NEGATIVE

## 2015-06-25 ENCOUNTER — Encounter: Payer: Self-pay | Admitting: Family Medicine

## 2015-06-25 ENCOUNTER — Ambulatory Visit (INDEPENDENT_AMBULATORY_CARE_PROVIDER_SITE_OTHER): Payer: 59 | Admitting: Family Medicine

## 2015-06-25 VITALS — BP 138/68 | HR 91 | Ht 64.0 in | Wt 199.0 lb

## 2015-06-25 DIAGNOSIS — E119 Type 2 diabetes mellitus without complications: Secondary | ICD-10-CM

## 2015-06-25 DIAGNOSIS — I10 Essential (primary) hypertension: Secondary | ICD-10-CM

## 2015-06-25 DIAGNOSIS — E785 Hyperlipidemia, unspecified: Secondary | ICD-10-CM | POA: Diagnosis not present

## 2015-06-25 LAB — HEMOGLOBIN A1C
HEMOGLOBIN A1C: 6.3 % — AB (ref <5.7)
MEAN PLASMA GLUCOSE: 134 mg/dL — AB (ref <117)

## 2015-06-25 LAB — COMPLETE METABOLIC PANEL WITH GFR
ALT: 20 U/L (ref 6–29)
AST: 19 U/L (ref 10–30)
Albumin: 3.9 g/dL (ref 3.6–5.1)
Alkaline Phosphatase: 79 U/L (ref 33–115)
BUN: 15 mg/dL (ref 7–25)
CHLORIDE: 104 mmol/L (ref 98–110)
CO2: 27 mmol/L (ref 20–31)
Calcium: 9.4 mg/dL (ref 8.6–10.2)
Creat: 0.81 mg/dL (ref 0.50–1.10)
GFR, Est African American: >89 mL/min (ref >=60)
GFR, Est Non African American: >89 mL/min (ref >=60)
GLUCOSE: 92 mg/dL (ref 65–99)
POTASSIUM: 4 mmol/L (ref 3.5–5.3)
SODIUM: 141 mmol/L (ref 135–146)
Total Bilirubin: 0.6 mg/dL (ref 0.2–1.2)
Total Protein: 6.3 g/dL (ref 6.1–8.1)

## 2015-06-25 LAB — LIPID PANEL
CHOL/HDL RATIO: 3.3 ratio (ref <=5.0)
Cholesterol: 143 mg/dL (ref 125–200)
HDL: 44 mg/dL — ABNORMAL LOW (ref >=46)
LDL CALC: 82 mg/dL (ref <130)
Triglycerides: 85 mg/dL (ref <150)
VLDL: 17 mg/dL (ref <30)

## 2015-06-25 NOTE — Progress Notes (Signed)
   Subjective:    Patient ID: Megan [name], female    DOB: [birth year], 45 y.o.   MRN: [id number]  HPI Diabetes - no hypoglycemic events. No wounds or sores that are not healing well. No increased thirst or urination. Checking glucose at home. Taking medications as prescribed without any side effects. She is currently on Victoza. Last hemoglobin A1c of 6.2 in November.  Hypertension- Pt denies chest pain, SOB, dizziness, or heart palpitations.  Taking meds as directed w/o problems.  Denies medication side effects.   Hyperlipidemia-we also checked lipids in August and she agreed to try a statin. Tolerating well.   No myalgias or side effects.  Review of Systems     Objective:   Physical Exam  Constitutional: She is oriented to person, place, and time. She appears well-developed and well-nourished.  HENT:  Head: Normocephalic and atraumatic.  Cardiovascular: Normal rate, regular rhythm and normal heart sounds.   Pulmonary/Chest: Effort normal and breath sounds normal.  Neurological: She is alert and oriented to person, place, and time.  Skin: Skin is warm and dry.  Psychiatric: She has a normal mood and affect. Her behavior is normal.          Assessment & Plan:  DM- last A1c was very well controlled. We will send her to the lab today for her A1c. Currently on a statin and arm. F/U in 3-4 months.   HTN - well controlled. F/U in 3-4 months.   Hyperlipidemia - due to recheck lipids.  Dong well on statin.

## 2015-07-31 ENCOUNTER — Other Ambulatory Visit: Payer: Self-pay | Admitting: Family Medicine

## 2015-08-11 ENCOUNTER — Other Ambulatory Visit: Payer: Self-pay | Admitting: *Deleted

## 2015-08-11 ENCOUNTER — Encounter: Payer: Self-pay | Admitting: Family Medicine

## 2015-08-11 MED ORDER — MECLIZINE HCL 25 MG PO TABS: 25.0000 mg | ORAL_TABLET | Freq: Three times a day (TID) | ORAL | Status: DC | PRN

## 2015-08-12 ENCOUNTER — Other Ambulatory Visit: Payer: Self-pay | Admitting: Family Medicine

## 2015-08-12 MED ORDER — DIAZEPAM 2 MG PO TABS: 2.0000 mg | ORAL_TABLET | Freq: Three times a day (TID) | ORAL | Status: DC | PRN

## 2015-09-23 ENCOUNTER — Ambulatory Visit (INDEPENDENT_AMBULATORY_CARE_PROVIDER_SITE_OTHER): Payer: 59 | Admitting: Family Medicine

## 2015-09-23 ENCOUNTER — Encounter: Payer: Self-pay | Admitting: Family Medicine

## 2015-09-23 ENCOUNTER — Ambulatory Visit (INDEPENDENT_AMBULATORY_CARE_PROVIDER_SITE_OTHER): Payer: 59

## 2015-09-23 VITALS — BP 118/82 | HR 93 | Wt 201.0 lb

## 2015-09-23 DIAGNOSIS — Z111 Encounter for screening for respiratory tuberculosis: Secondary | ICD-10-CM | POA: Diagnosis not present

## 2015-09-23 DIAGNOSIS — E119 Type 2 diabetes mellitus without complications: Secondary | ICD-10-CM

## 2015-09-23 DIAGNOSIS — I10 Essential (primary) hypertension: Secondary | ICD-10-CM | POA: Diagnosis not present

## 2015-09-23 DIAGNOSIS — M25562 Pain in left knee: Secondary | ICD-10-CM | POA: Diagnosis not present

## 2015-09-23 DIAGNOSIS — H8111 Benign paroxysmal vertigo, right ear: Secondary | ICD-10-CM | POA: Diagnosis not present

## 2015-09-23 DIAGNOSIS — M1712 Unilateral primary osteoarthritis, left knee: Secondary | ICD-10-CM | POA: Insufficient documentation

## 2015-09-23 DIAGNOSIS — S83002A Unspecified subluxation of left patella, initial encounter: Secondary | ICD-10-CM | POA: Insufficient documentation

## 2015-09-23 LAB — POCT GLYCOSYLATED HEMOGLOBIN (HGB A1C): Hemoglobin A1C: 6.2

## 2015-09-23 MED ORDER — AMITRIPTYLINE HCL 25 MG PO TABS: 25.0000 mg | ORAL_TABLET | Freq: Every day | ORAL | Status: DC

## 2015-09-23 MED ORDER — BUTALBITAL-APAP-CAFFEINE 50-300-40 MG PO CAPS: 1.0000 | ORAL_CAPSULE | Freq: Two times a day (BID) | ORAL | Status: DC | PRN

## 2015-09-23 MED ORDER — LOSARTAN POTASSIUM-HCTZ 50-12.5 MG PO TABS: 1.0000 | ORAL_TABLET | Freq: Every day | ORAL | Status: DC

## 2015-09-23 MED ORDER — ATORVASTATIN CALCIUM 20 MG PO TABS: 20.0000 mg | ORAL_TABLET | Freq: Every day | ORAL | Status: DC

## 2015-09-23 MED ORDER — METOPROLOL SUCCINATE ER 25 MG PO TB24: 25.0000 mg | ORAL_TABLET | Freq: Every day | ORAL | Status: DC

## 2015-09-23 MED ORDER — LIRAGLUTIDE 18 MG/3ML ~~LOC~~ SOPN: 1.2000 mg | PEN_INJECTOR | Freq: Every day | SUBCUTANEOUS | Status: DC

## 2015-09-23 NOTE — Assessment & Plan Note (Signed)
Possibly degenerative meniscus tear versus loose body irritation. Injection today. Recheck in a few weeks. Warned about risk of hyperglycemia with steroid injection.

## 2015-09-23 NOTE — Progress Notes (Signed)
Subjective:    CC: DM  HPI:  Diabetes - no hypoglycemic events. No wounds or sores that are not healing well. No increased thirst or urination. Checking glucose at home. Taking medications as prescribed without any side effects.  Hypertension- Pt denies chest pain, SOB, dizziness, or heart palpitations.  Taking meds as directed w/o problems.  Denies medication side effects.    Called for recent pap done in Jan with her OB/GYN at [facility].    She also complains of left knee pain that started about 2 weeks ago. She said she squatted down to get something at the grocery store and had a sudden pain. She says it's been bothering her since then has been walking at times. She is never had any prior problems or injuries.  Vertigo-after her recent eye surgery she said that a nerve was cut. And her vertigo kicked in worse than it ever has right after surgery. She said she was given Fioricet for pain after the surgery but says it actually helped her vertigo more than the diazepam. She said she would like to have a small prescription to just use as needed if possible. Right now she is doing well.  Past medical history, Surgical history, Family history not pertinant except as noted below, Social history, Allergies, and medications have been entered into the medical record, reviewed, and corrections made.   Review of Systems: No fevers, chills, night sweats, weight loss, chest pain, or shortness of breath.   Objective:    General: Well Developed, well nourished, and in no acute distress.  Neuro: Alert and oriented x3, extra-ocular muscles intact, sensation grossly intact.  HEENT: Normocephalic, atraumatic  Skin: Warm and dry, no rashes. Cardiac: Regular rate and rhythm, no murmurs rubs or gallops, no lower extremity edema.  Respiratory: Clear to auscultation bilaterally. Not using accessory muscles, speaking in full sentences.   Impression and Recommendations:   DM-  A1C of 6.2.Well controlled.  Continue current regimen. Follow up in 3-4 months  HTN- Well controlled. Continue current regimen. Follow up in 3-4 mo.   Vertigo-given 30 tabs of Fioricet to use as needed. Encouraged her to use them sparingly and the diazepam was removed from her medication list today. Next  She is also changing jobs soon which is a positive thing for her. 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 And will need 90 days supplies on her prescriptions so that she can get through the period when she will not have insurance.

## 2015-09-23 NOTE — Patient Instructions (Signed)
Thank you for coming in today. Call or go to the ER if you develop a large red swollen joint with extreme pain or oozing puss.  Return in 1 month or so.   Knee Pain Knee pain is a very common symptom and can have many causes. Knee pain often goes away when you follow your health care provider's instructions for relieving pain and discomfort at home. However, knee pain can develop into a condition that needs treatment. Some conditions may include:  Arthritis caused by wear and tear (osteoarthritis).  Arthritis caused by swelling and irritation (rheumatoid arthritis or gout).  A cyst or growth in your knee.  An infection in your knee joint.  An injury that will not heal.  Damage, swelling, or irritation of the tissues that support your knee (torn ligaments or tendinitis). If your knee pain continues, additional tests may be ordered to diagnose your condition. Tests may include X-rays or other imaging studies of your knee. You may also need to have fluid removed from your knee. Treatment for ongoing knee pain depends on the cause, but treatment may include:  Medicines to relieve pain or swelling.  Steroid injections in your knee.  Physical therapy.  Surgery. HOME CARE INSTRUCTIONS  Take medicines only as directed by your health care provider.  Rest your knee and keep it raised (elevated) while you are resting.  Do not do things that cause or worsen pain.  Avoid high-impact activities or exercises, such as running, jumping rope, or doing jumping jacks.  Apply ice to the knee area:  Put ice in a plastic bag.  Place a towel between your skin and the bag.  Leave the ice on for 20 minutes, 2-3 times a day.  Ask your health care provider if you should wear an elastic knee support.  Keep a pillow under your knee when you sleep.  Lose weight if you are overweight. Extra weight can put pressure on your knee.  Do not use any tobacco products, including cigarettes, chewing  tobacco, or electronic cigarettes. If you need help quitting, ask your health care provider. Smoking may slow the healing of any bone and joint problems that you may have. SEEK MEDICAL CARE IF:  Your knee pain continues, changes, or gets worse.  You have a fever along with knee pain.  Your knee buckles or locks up.  Your knee becomes more swollen. SEEK IMMEDIATE MEDICAL CARE IF:   Your knee joint feels hot to the touch.  You have chest pain or trouble breathing.   This information is not intended to replace advice given to you by your health care provider. Make sure you discuss any questions you have with your health care provider.   Document Released: 02/20/2007 Document Revised: 05/16/2014 Document Reviewed: 12/09/2013 Elsevier Interactive Patient Education [employer].

## 2015-09-23 NOTE — Progress Notes (Signed)
   Subjective:    I'm seeing this patient as a consultation for:  Dr. Madilyn Fireman  CC: Left knee pain  HPI: Patient's had several weeks of left medial and lateral knee pain. It started when she squatted down to pick an object up resulting in a pop and pain in her knee. She notes occasional swelling. When she ambulates she occasionally notes painful sharp sensations. She denies any locking or catching or giving way. She's tried some over-the-counter medicines which have not helped much. She recently has gotten over the convalescent period following a lumbar spinal fusion. No radiating pain weakness or numbness.  Past medical history, Surgical history, Family history not pertinant except as noted below, Social history, Allergies, and medications have been entered into the medical record, reviewed, and no changes needed.   Review of Systems: No headache, visual changes, nausea, vomiting, diarrhea, constipation, dizziness, abdominal pain, skin rash, fevers, chills, night sweats, weight loss, swollen lymph nodes, body aches, joint swelling, muscle aches, chest pain, shortness of breath, mood changes, visual or auditory hallucinations.   Objective:    Filed Vitals:   09/23/15 1033  BP: 118/82  Pulse: 93   General: Well Developed, well nourished, and in no acute distress.  Neuro/Psych: Alert and oriented x3, extra-ocular muscles intact, able to move all 4 extremities, sensation grossly intact. Skin: Warm and dry, no rashes noted.  Respiratory: Not using accessory muscles, speaking in full sentences, trachea midline.  Cardiovascular: Pulses palpable, no extremity edema. Abdomen: Does not appear distended. MSK: Left knee is normal-appearing without significant effusion. Normal motion without crepitations. Stable ligamentous exam. Negative McMurray's testing. Nontender.  X-ray left knee shows loose bodies at the medial aspect of the knee without significant impingement visible. Mild medial  compartment DJD otherwise normal-appearing. Awaiting formal radiology review.  Procedure: Real-time Ultrasound Guided Injection of left knee  Device: GE Logiq E  Images permanently stored and available for review in the ultrasound unit. Verbal informed consent obtained. Discussed risks and benefits of procedure. Warned about infection bleeding damage to structures skin hypopigmentation and fat atrophy among others. Patient expresses understanding and agreement Time-out conducted.  Noted no overlying erythema, induration, or other signs of local infection.  Skin prepped in a sterile fashion.  Local anesthesia: Topical Ethyl chloride.  With sterile technique and under real time ultrasound guidance: 80 mg of Kenalog and 4 mL of Marcaine injected easily.  Completed without difficulty  Pain immediately resolved suggesting accurate placement of the medication.  Advised to call if fevers/chills, erythema, induration, drainage, or persistent bleeding.  Images permanently stored and available for review in the ultrasound unit.  Impression: Technically successful ultrasound guided injection.    Results for orders placed or performed in visit on 09/23/15 (from the past 24 hour(s))  POCT glycosylated hemoglobin (Hb A1C)     Status: None   Collection Time: 09/23/15  9:57 AM  Result Value Ref Range   Hemoglobin A1C 6.2    No results found.  Impression and Recommendations:   This case required medical decision making of moderate complexity.

## 2015-09-24 ENCOUNTER — Encounter: Payer: Self-pay | Admitting: Family Medicine

## 2015-09-24 NOTE — Progress Notes (Signed)
Quick Note:  Xray normal appearing. ______

## 2015-09-25 ENCOUNTER — Ambulatory Visit (INDEPENDENT_AMBULATORY_CARE_PROVIDER_SITE_OTHER): Payer: 59 | Admitting: Family Medicine

## 2015-09-25 VITALS — BP 123/76 | HR 83

## 2015-09-25 DIAGNOSIS — Z111 Encounter for screening for respiratory tuberculosis: Secondary | ICD-10-CM

## 2015-09-25 LAB — TB SKIN TEST
INDURATION: 0 mm
TB SKIN TEST: NEGATIVE

## 2015-09-25 NOTE — Progress Notes (Signed)
   Subjective:    Patient ID: Megan [name], female    DOB: 1971/04/23, 45 y.o.   MRN: [id number]  HPI   Patient is here for TB skin read and to pick up completed form Review of Systems     Objective:   Physical Exam        Assessment & Plan:

## 2015-10-06 ENCOUNTER — Other Ambulatory Visit: Payer: Self-pay | Admitting: *Deleted

## 2015-10-06 MED ORDER — LIRAGLUTIDE 18 MG/3ML ~~LOC~~ SOPN: 1.2000 mg | PEN_INJECTOR | Freq: Every day | SUBCUTANEOUS | Status: DC

## 2015-10-21 ENCOUNTER — Ambulatory Visit: Payer: 59 | Admitting: Family Medicine

## 2016-01-21 ENCOUNTER — Ambulatory Visit: Payer: 59 | Admitting: Family Medicine

## 2016-02-15 ENCOUNTER — Telehealth: Payer: 59 | Admitting: Family

## 2016-02-15 DIAGNOSIS — J301 Allergic rhinitis due to pollen: Secondary | ICD-10-CM

## 2016-02-15 DIAGNOSIS — J208 Acute bronchitis due to other specified organisms: Secondary | ICD-10-CM

## 2016-02-15 MED ORDER — BENZONATATE 100 MG PO CAPS: 100.0000 mg | ORAL_CAPSULE | Freq: Two times a day (BID) | ORAL | 0 refills | Status: DC | PRN

## 2016-02-15 MED ORDER — PREDNISONE 10 MG (21) PO TBPK: ORAL_TABLET | ORAL | 0 refills | Status: DC

## 2016-02-15 NOTE — Addendum Note (Signed)
Addended by: Evelina Dun A on: 02/15/2016 08:08 PM   Modules accepted: Orders

## 2016-02-15 NOTE — Progress Notes (Signed)
We are sorry that you are not feeling well.  Here is how we plan to help!  Based on what you have shared with me it looks like you have upper respiratory tract inflammation that has resulted in a significant cough.  Inflammation and infection in the upper respiratory tract is commonly called bronchitis and has four common causes:  Allergies, Viral Infections, Acid Reflux and Bacterial Infections.  Allergies, viruses and acid reflux are treated by controlling symptoms or eliminating the cause. An example might be a cough caused by taking certain blood pressure medications. You stop the cough by changing the medication. Another example might be a cough caused by acid reflux. Controlling the reflux helps control the cough.  Based on your presentation I believe you most likely have A cough due to allergies.  I recommend that you start the an over-the counter-allergy medication such as Claritin 10 mg or Zyrtec 10 mg daily.    In addition you may use A non-prescription cough medication called Robitussin DAC. Take 2 teaspoons every 8 hours or Delsym: take 2 teaspoons every 12 hours., A non-prescription cough medication called Mucinex DM: take 2 tablets every 12 hours. and A prescription cough medication called Tessalon Perles 112m. You may take 1-2 capsules every 8 hours as needed for your cough.    HOME CARE . Only take medications as instructed by your medical team. . Complete the entire course of an antibiotic. . Drink plenty of fluids and get plenty of rest. . Avoid close contacts especially the very young and the elderly . Cover your mouth if you cough or cough into your sleeve. . Always remember to wash your hands . A steam or ultrasonic humidifier can help congestion.    GET HELP RIGHT AWAY IF: . You develop worsening fever. . You become short of breath . You cough up blood. . Your symptoms persist after you have completed your treatment plan MAKE SURE YOU   Understand these  instructions.  Will watch your condition.  Will get help right away if you are not doing well or get worse.  Your e-visit answers were reviewed by a board certified advanced clinical practitioner to complete your personal care plan.  Depending on the condition, your plan could have included both over the counter or prescription medications. If there is a problem please reply  once you have received a response from your provider. Your safety is important to [place]  If you have drug allergies check your prescription carefully.    You can use MyChart to ask questions about today's visit, request a non-urgent call back, or ask for a work or school excuse for 24 hours related to this e-Visit. If it has been greater than 24 hours you will need to follow up with your provider, or enter a new e-Visit to address those concerns. You will get an e-mail in the next two days asking about your experience.  I hope that your e-visit has been valuable and will speed your recovery. Thank you for using e-visits.

## 2016-02-17 ENCOUNTER — Other Ambulatory Visit: Payer: Self-pay | Admitting: *Deleted

## 2016-02-17 MED ORDER — AMBULATORY NON FORMULARY MEDICATION: 0 refills | Status: DC

## 2016-02-17 MED ORDER — AMBULATORY NON FORMULARY MEDICATION: 0 refills | Status: AC

## 2016-06-19 ENCOUNTER — Encounter: Payer: Self-pay | Admitting: Family Medicine

## 2016-06-20 ENCOUNTER — Other Ambulatory Visit: Payer: Self-pay | Admitting: Family Medicine

## 2016-06-20 MED ORDER — ALBUTEROL SULFATE HFA 108 (90 BASE) MCG/ACT IN AERS: 2.0000 | INHALATION_SPRAY | Freq: Four times a day (QID) | RESPIRATORY_TRACT | 0 refills | Status: DC | PRN

## 2016-06-20 NOTE — Progress Notes (Signed)
albter

## 2016-07-05 ENCOUNTER — Encounter: Payer: Self-pay | Admitting: Family Medicine

## 2016-07-05 DIAGNOSIS — J301 Allergic rhinitis due to pollen: Secondary | ICD-10-CM

## 2016-07-05 DIAGNOSIS — J208 Acute bronchitis due to other specified organisms: Secondary | ICD-10-CM

## 2016-07-05 MED ORDER — BENZONATATE 100 MG PO CAPS: 100.0000 mg | ORAL_CAPSULE | Freq: Two times a day (BID) | ORAL | 0 refills | Status: DC | PRN

## 2016-07-05 MED ORDER — PREDNISONE 20 MG PO TABS: 40.0000 mg | ORAL_TABLET | Freq: Every day | ORAL | 0 refills | Status: DC

## 2016-07-25 ENCOUNTER — Telehealth: Payer: 59 | Admitting: Family

## 2016-07-25 DIAGNOSIS — M545 Low back pain: Secondary | ICD-10-CM

## 2016-07-25 MED ORDER — BACLOFEN 10 MG PO TABS: 10.0000 mg | ORAL_TABLET | Freq: Three times a day (TID) | ORAL | 0 refills | Status: DC | PRN

## 2016-07-25 MED ORDER — ETODOLAC 300 MG PO CAPS: 300.0000 mg | ORAL_CAPSULE | Freq: Two times a day (BID) | ORAL | 0 refills | Status: DC

## 2016-07-25 NOTE — Progress Notes (Signed)

## 2016-09-23 LAB — HM PAP SMEAR: HM Pap smear: NEGATIVE

## 2016-09-30 ENCOUNTER — Encounter: Payer: Self-pay | Admitting: Family Medicine

## 2016-10-16 ENCOUNTER — Other Ambulatory Visit: Payer: Self-pay | Admitting: Family Medicine

## 2016-10-24 ENCOUNTER — Other Ambulatory Visit: Payer: Self-pay | Admitting: *Deleted

## 2016-10-24 MED ORDER — LOSARTAN POTASSIUM-HCTZ 50-12.5 MG PO TABS: 1.0000 | ORAL_TABLET | Freq: Every day | ORAL | 0 refills | Status: DC

## 2016-11-13 ENCOUNTER — Other Ambulatory Visit: Payer: Self-pay | Admitting: Family Medicine

## 2016-11-28 ENCOUNTER — Other Ambulatory Visit: Payer: Self-pay | Admitting: Family Medicine

## 2016-11-30 ENCOUNTER — Other Ambulatory Visit: Payer: Self-pay

## 2016-11-30 MED ORDER — LOSARTAN POTASSIUM-HCTZ 50-12.5 MG PO TABS: 1.0000 | ORAL_TABLET | Freq: Every day | ORAL | 0 refills | Status: DC

## 2017-01-23 ENCOUNTER — Other Ambulatory Visit: Payer: Self-pay | Admitting: Family Medicine

## 2017-01-26 ENCOUNTER — Ambulatory Visit: Payer: Self-pay | Admitting: Family Medicine

## 2017-02-06 ENCOUNTER — Ambulatory Visit: Payer: 59 | Admitting: Family Medicine

## 2017-02-20 ENCOUNTER — Other Ambulatory Visit: Payer: Self-pay | Admitting: Family Medicine

## 2017-02-20 ENCOUNTER — Encounter: Payer: Self-pay | Admitting: Family Medicine

## 2017-02-27 ENCOUNTER — Ambulatory Visit: Payer: Self-pay | Admitting: Family Medicine

## 2017-03-08 ENCOUNTER — Other Ambulatory Visit: Payer: Self-pay | Admitting: Family Medicine

## 2017-03-08 ENCOUNTER — Encounter: Payer: Self-pay | Admitting: Family Medicine

## 2017-03-08 ENCOUNTER — Ambulatory Visit (INDEPENDENT_AMBULATORY_CARE_PROVIDER_SITE_OTHER): Payer: 59 | Admitting: Family Medicine

## 2017-03-08 VITALS — BP 131/63 | HR 93 | Ht 64.0 in | Wt 222.0 lb

## 2017-03-08 DIAGNOSIS — Z6838 Body mass index (BMI) 38.0-38.9, adult: Secondary | ICD-10-CM

## 2017-03-08 DIAGNOSIS — Z566 Other physical and mental strain related to work: Secondary | ICD-10-CM | POA: Diagnosis not present

## 2017-03-08 DIAGNOSIS — R635 Abnormal weight gain: Secondary | ICD-10-CM

## 2017-03-08 DIAGNOSIS — I10 Essential (primary) hypertension: Secondary | ICD-10-CM

## 2017-03-08 DIAGNOSIS — F329 Major depressive disorder, single episode, unspecified: Secondary | ICD-10-CM | POA: Diagnosis not present

## 2017-03-08 DIAGNOSIS — F32A Depression, unspecified: Secondary | ICD-10-CM

## 2017-03-08 DIAGNOSIS — E119 Type 2 diabetes mellitus without complications: Secondary | ICD-10-CM

## 2017-03-08 LAB — COMPLETE METABOLIC PANEL WITH GFR
AG RATIO: 1.6 (calc) (ref 1.0–2.5)
ALBUMIN MSPROF: 3.8 g/dL (ref 3.6–5.1)
ALT: 19 U/L (ref 6–29)
AST: 22 U/L (ref 10–30)
Alkaline phosphatase (APISO): 75 U/L (ref 33–115)
BUN: 12 mg/dL (ref 7–25)
CALCIUM: 8.9 mg/dL (ref 8.6–10.2)
CO2: 23 mmol/L (ref 20–32)
CREATININE: 0.81 mg/dL (ref 0.50–1.10)
Chloride: 107 mmol/L (ref 98–110)
GFR, EST AFRICAN AMERICAN: 102 mL/min/{1.73_m2} (ref >=60)
GFR, EST NON AFRICAN AMERICAN: 88 mL/min/{1.73_m2} (ref >=60)
GLOBULIN: 2.4 g/dL (ref 1.9–3.7)
Glucose, Bld: 119 mg/dL — ABNORMAL HIGH (ref 65–99)
POTASSIUM: 4.3 mmol/L (ref 3.5–5.3)
SODIUM: 140 mmol/L (ref 135–146)
TOTAL PROTEIN: 6.2 g/dL (ref 6.1–8.1)
Total Bilirubin: 0.5 mg/dL (ref 0.2–1.2)

## 2017-03-08 LAB — LIPID PANEL W/REFLEX DIRECT LDL
CHOL/HDL RATIO: 4.4 (calc) (ref <5.0)
Cholesterol: 193 mg/dL (ref <200)
HDL: 44 mg/dL — ABNORMAL LOW (ref >50)
LDL Cholesterol (Calc): 132 mg/dL (calc) — ABNORMAL HIGH
NON-HDL CHOLESTEROL (CALC): 149 mg/dL — AB (ref <130)
Triglycerides: 76 mg/dL (ref <150)

## 2017-03-08 LAB — POCT GLYCOSYLATED HEMOGLOBIN (HGB A1C): Hemoglobin A1C: 6.4

## 2017-03-08 MED ORDER — BUPROPION HCL ER (XL) 150 MG PO TB24: 150.0000 mg | ORAL_TABLET | ORAL | 2 refills | Status: DC

## 2017-03-08 MED ORDER — LIRAGLUTIDE 18 MG/3ML ~~LOC~~ SOPN: 1.8000 mg | PEN_INJECTOR | Freq: Every day | SUBCUTANEOUS | 4 refills | Status: DC

## 2017-03-08 MED ORDER — METOPROLOL SUCCINATE ER 25 MG PO TB24: 25.0000 mg | ORAL_TABLET | Freq: Every day | ORAL | 1 refills | Status: DC

## 2017-03-08 NOTE — Progress Notes (Signed)
Subjective:    CC: DM, HTN  HPI: Hypertension- Pt denies chest pain, SOB, dizziness, or heart palpitations.  Taking meds as directed w/o problems.  Denies medication side effects.    Diabetes - no hypoglycemic events. No wounds or sores that are not healing well. No increased thirst or urination. Checking glucose at home. Taking medications as prescribed without any side effects.   Wanted to discuss her weight gain today.  After she had her surgery was more sedentary she was unable to be quite as mobile and started to gain weight back.  Since then she has maintained she has not continued to gain but just has had a hard time getting it back off.  She is also been very stressed and overwhelmed.  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  She has not had a single day off and over a year and a half.  And she really has not had a lot of time to do self-care.  She sometimes will skip meals because she just runs out of time and right now they are short staffed.  Past medical history, Surgical history, Family history not pertinant except as noted below, Social history, Allergies, and medications have been entered into the medical record, reviewed, and corrections made.   Review of Systems: No fevers, chills, night sweats, weight loss, chest pain, or shortness of breath.   Objective:    General: Well Developed, well nourished, and in no acute distress.  Neuro: Alert and oriented x3, extra-ocular muscles intact, sensation grossly intact.  HEENT: Normocephalic, atraumatic  Skin: Warm and dry, no rashes. Cardiac: Regular rate and rhythm, no murmurs rubs or gallops, no lower extremity edema.  Respiratory: Clear to auscultation bilaterally. Not using accessory muscles, speaking in full sentences.   Impression and Recommendations:    HTN - Well controlled. Continue current  regimen.  For CMP and lipid panel.  Follow up in  3-4 months   DM -hemoglobin A1c looks great at 6.4 today but it is up slightly from previous.  She is noticed more in the last month her blood sugars have been running a little bit higher than usual so we discussed the option of also increasing her Victoza to 1.8.  This will help with the blood sugars as well as help potentially regulate her appetite.  Abnormal weight gain/BMI 38-discussed getting on track by sitting at least 3 small goals for herself at least initially especially since right now she is so busy that she is not going to be able to really do a lot of food preparation or work out regularly.  We discussed that we could possibly consider medication in the future but I want her to start by making some lifestyle changes at least initially.  For example she can cut out soda, work on preparing more meals for herself, and treating out some snacks and foods for healthier options, increasing her protein.  I would like to see her back in 3 months  Acute stress/acute depression-discussed options.  She is clearly feeling very overwhelmed.  Discussed possible therapy/counseling or maybe even a life coach for her to help her set goals and to really help her be able to let go of things when she is not working.  It sounds like she is having a really hard time doing that.  She says even when  she is not at work she is obsessed with looking at her phone and keeping up with things even when she is not there.  We also discussed may be a medication to come help take the edge off.  Discussed Wellbutrin as an option.  She is willing to consider it.  We will go ahead and send over prescription certainly is up to her if she decides to fill it.  The HQ 9 score of 18.

## 2017-03-09 ENCOUNTER — Encounter: Payer: Self-pay | Admitting: Family Medicine

## 2017-03-10 ENCOUNTER — Other Ambulatory Visit: Payer: Self-pay | Admitting: *Deleted

## 2017-03-10 MED ORDER — ATORVASTATIN CALCIUM 40 MG PO TABS: 40.0000 mg | ORAL_TABLET | Freq: Every day | ORAL | 3 refills | Status: DC

## 2017-03-10 NOTE — Addendum Note (Signed)
Addended by: Beatrice Lecher D on: 03/10/2017 08:36 AM   Modules accepted: Orders

## 2017-03-15 ENCOUNTER — Telehealth: Payer: Self-pay | Admitting: *Deleted

## 2017-03-15 NOTE — Telephone Encounter (Signed)
Pre Authorization sent to cover my meds. (Key: [id number])

## 2017-03-15 NOTE — Telephone Encounter (Signed)
Response from the insurance company is Your request has been n/a  This medication is on your plan's list of covered drugs. Prior authorization is not required at this time. If your pharmacy has questions regarding the processing of your prescription, please have them call the [facility] pharmacy help desk at [phone]. For additional information, the member can contact Member Services by calling the number on the back of their ID Card.  faxed this info to pharmacy

## 2017-05-29 ENCOUNTER — Other Ambulatory Visit: Payer: Self-pay | Admitting: Family Medicine

## 2017-07-06 ENCOUNTER — Encounter: Payer: Self-pay | Admitting: Family Medicine

## 2017-07-06 ENCOUNTER — Ambulatory Visit: Payer: 59 | Admitting: Family Medicine

## 2017-07-06 VITALS — BP 140/88 | HR 93 | Ht 64.0 in | Wt 219.0 lb

## 2017-07-06 DIAGNOSIS — I10 Essential (primary) hypertension: Secondary | ICD-10-CM

## 2017-07-06 DIAGNOSIS — R635 Abnormal weight gain: Secondary | ICD-10-CM | POA: Diagnosis not present

## 2017-07-06 DIAGNOSIS — E7800 Pure hypercholesterolemia, unspecified: Secondary | ICD-10-CM

## 2017-07-06 DIAGNOSIS — F430 Acute stress reaction: Secondary | ICD-10-CM

## 2017-07-06 DIAGNOSIS — E119 Type 2 diabetes mellitus without complications: Secondary | ICD-10-CM | POA: Diagnosis not present

## 2017-07-06 LAB — COMPLETE METABOLIC PANEL WITH GFR
AG Ratio: 1.6 (calc) (ref 1.0–2.5)
ALBUMIN MSPROF: 3.8 g/dL (ref 3.6–5.1)
ALKALINE PHOSPHATASE (APISO): 91 U/L (ref 33–115)
ALT: 22 U/L (ref 6–29)
AST: 17 U/L (ref 10–30)
BUN: 15 mg/dL (ref 7–25)
CO2: 25 mmol/L (ref 20–32)
CREATININE: 0.96 mg/dL (ref 0.50–1.10)
Calcium: 8.9 mg/dL (ref 8.6–10.2)
Chloride: 107 mmol/L (ref 98–110)
GFR, Est African American: 83 mL/min/{1.73_m2} (ref >=60)
GFR, Est Non African American: 72 mL/min/{1.73_m2} (ref >=60)
GLUCOSE: 120 mg/dL — AB (ref 65–99)
Globulin: 2.4 g/dL (calc) (ref 1.9–3.7)
Potassium: 3.7 mmol/L (ref 3.5–5.3)
Sodium: 141 mmol/L (ref 135–146)
Total Bilirubin: 0.7 mg/dL (ref 0.2–1.2)
Total Protein: 6.2 g/dL (ref 6.1–8.1)

## 2017-07-06 LAB — LIPID PANEL
CHOLESTEROL: 138 mg/dL (ref <200)
HDL: 38 mg/dL — AB (ref >50)
LDL Cholesterol (Calc): 80 mg/dL (calc)
Non-HDL Cholesterol (Calc): 100 mg/dL (calc) (ref <130)
TRIGLYCERIDES: 103 mg/dL (ref <150)
Total CHOL/HDL Ratio: 3.6 (calc) (ref <5.0)

## 2017-07-06 LAB — POCT GLYCOSYLATED HEMOGLOBIN (HGB A1C): HEMOGLOBIN A1C: 6.4

## 2017-07-06 LAB — HM DIABETES EYE EXAM

## 2017-07-06 MED ORDER — DAPAGLIFLOZIN PROPANEDIOL 5 MG PO TABS: 5.0000 mg | ORAL_TABLET | Freq: Every day | ORAL | 3 refills | Status: DC

## 2017-07-06 NOTE — Patient Instructions (Signed)
Follow up in 2 weeks for a BP recheck with the nurse.

## 2017-07-06 NOTE — Progress Notes (Signed)
Subjective:    CC: DM,   HPI:  Diabetes - no hypoglycemic events. No wounds or sores that are not healing well. No increased thirst or urination. Checking glucose at home.  Blood sugars have been running between 180 and 280.  We did increase her Victoza strength at last office visit . taking medications as prescribed without any side effects.  She has not been exercising and often eats out for lunch.  She feels like her blood sugars have been going up and down  Hypertension- Pt denies chest pain, SOB, dizziness, or heart palpitations.  Taking meds as directed w/o problems.  Denies medication side effects.    Hyperlipidemia-we recently increased her atorvastatin to 40 mg.  She is been tolerating it well without any problems or side effects.  Been under a lot of stress at work.  And working long hours.  She says now that her son is going to be a senior in high school next year she really wants to have more time with him and so has actually been interviewing for a new job in a completely different field.  She says her father is adopted and has recently reached out to find out who his real family is.  He found out that 2 of his siblings were diabetic and actually died from diabetic complications.  She is concerned since right now she is the only one in her family that has full-blown diabetes and actually takes medicine for it.  Her father has his A1c monitored and her sister also has her A1c monitored but neither 1 of them actually take medication.  Past medical history, Surgical history, Family history not pertinant except as noted below, Social history, Allergies, and medications have been entered into the medical record, reviewed, and corrections made.   Review of Systems: No fevers, chills, night sweats, weight loss, chest pain, or shortness of breath.   Objective:    General: Well Developed, well nourished, and in no acute distress.  Neuro: Alert and oriented x3, extra-ocular muscles intact,  sensation grossly intact.  HEENT: Normocephalic, atraumatic  Skin: Warm and dry, no rashes. Cardiac: Regular rate and rhythm, no murmurs rubs or gallops, no lower extremity edema.  Respiratory: Clear to auscultation bilaterally. Not using accessory muscles, speaking in full sentences.   Impression and Recommendations:    HTN - BP borderline today.  She feels a little stressed and emotional today.  DM -currently well controlled though her sugars have been trending upward.  We discussed options.  I would like to add a second agent to her Victoza which is Artie at 1.8 mg.  We will have her follow-up in 3 months.  Continue to work on healthy diet and regular exercise as much as possible.  I think really lowering her stress levels and her long work hours will be helpful.  We just discussed the importance of really controlling the factors that we have control over such as blood pressure blood sugar weight etc. especially since she is only 46 to reduce her future risk of complications. Lab Results  Component Value Date   HGBA1C 6.4 07/06/2017    Hyperlipidemia -due to recheck lipids now that she is on 40 mg of atorvastatin.  Acute distress-just encouraged her to work on stress reduction.  Hopefully she will be able to find a new job in position that allows her more time to spend with her family and take care of herself.  I think this will make a big difference in  her ability to control her blood sugars 2.

## 2017-07-07 ENCOUNTER — Telehealth: Payer: Self-pay | Admitting: Family Medicine

## 2017-07-07 NOTE — Telephone Encounter (Signed)
Received fax from [facility] that Wilder Glade was approved from 07/06/2017 until 07/06/2018. Pharmacy notified.   Reference: [id number].

## 2017-07-17 ENCOUNTER — Encounter: Payer: Self-pay | Admitting: Family Medicine

## 2017-07-21 ENCOUNTER — Ambulatory Visit: Payer: 59

## 2017-08-04 ENCOUNTER — Encounter: Payer: Self-pay | Admitting: Family Medicine

## 2017-08-20 ENCOUNTER — Other Ambulatory Visit: Payer: Self-pay | Admitting: Family Medicine

## 2017-08-21 ENCOUNTER — Other Ambulatory Visit: Payer: Self-pay | Admitting: *Deleted

## 2017-08-22 ENCOUNTER — Encounter: Payer: Self-pay | Admitting: Family Medicine

## 2017-08-22 MED ORDER — LIRAGLUTIDE 18 MG/3ML ~~LOC~~ SOPN: 1.8000 mg | PEN_INJECTOR | Freq: Every morning | SUBCUTANEOUS | 3 refills | Status: DC

## 2017-09-01 ENCOUNTER — Encounter: Payer: Self-pay | Admitting: Family Medicine

## 2017-09-04 NOTE — Telephone Encounter (Signed)
Megan [name] Please look into this for patient  Thank you [name]

## 2017-09-17 ENCOUNTER — Encounter: Payer: Self-pay | Admitting: Family Medicine

## 2017-09-18 ENCOUNTER — Other Ambulatory Visit: Payer: Self-pay | Admitting: Family Medicine

## 2017-09-18 MED ORDER — LIRAGLUTIDE 18 MG/3ML ~~LOC~~ SOPN: 1.8000 mg | PEN_INJECTOR | Freq: Every morning | SUBCUTANEOUS | 5 refills | Status: DC

## 2017-09-18 NOTE — Telephone Encounter (Signed)
Spoke with Megan [name] at [facility] and she stated the insurance would only cover the 2 pen pack of Victoza.  Pharmacy has been notified of the change.

## 2017-09-20 ENCOUNTER — Telehealth: Payer: Self-pay | Admitting: Emergency Medicine

## 2017-09-20 MED ORDER — LIRAGLUTIDE 18 MG/3ML ~~LOC~~ SOPN: 1.2000 mg | PEN_INJECTOR | Freq: Every morning | SUBCUTANEOUS | 5 refills | Status: DC

## 2017-09-20 NOTE — Telephone Encounter (Signed)
New rx sent for 1.2

## 2017-09-20 NOTE — Telephone Encounter (Signed)
Call from [facility] stating patient's insurance will not cover Victoza 1.8 mg qd but will cover 1.23m q day for 6 months; is this ok ?

## 2017-09-21 NOTE — Telephone Encounter (Signed)
Left VM for Pt with status update. Callback provided for any questions.

## 2017-09-29 ENCOUNTER — Ambulatory Visit: Payer: 59 | Admitting: Family Medicine

## 2017-10-03 ENCOUNTER — Ambulatory Visit (INDEPENDENT_AMBULATORY_CARE_PROVIDER_SITE_OTHER): Payer: 59 | Admitting: Family Medicine

## 2017-10-03 ENCOUNTER — Encounter: Payer: Self-pay | Admitting: Family Medicine

## 2017-10-03 VITALS — BP 126/66 | HR 85 | Ht 64.0 in | Wt 221.0 lb

## 2017-10-03 DIAGNOSIS — F430 Acute stress reaction: Secondary | ICD-10-CM | POA: Diagnosis not present

## 2017-10-03 DIAGNOSIS — I10 Essential (primary) hypertension: Secondary | ICD-10-CM

## 2017-10-03 DIAGNOSIS — E119 Type 2 diabetes mellitus without complications: Secondary | ICD-10-CM

## 2017-10-03 LAB — POCT GLYCOSYLATED HEMOGLOBIN (HGB A1C): HEMOGLOBIN A1C: 6.4 % — AB (ref 4.0–5.6)

## 2017-10-03 LAB — HM PAP SMEAR

## 2017-10-03 MED ORDER — OLMESARTAN MEDOXOMIL-HCTZ 20-12.5 MG PO TABS: 1.0000 | ORAL_TABLET | Freq: Every day | ORAL | 1 refills | Status: DC

## 2017-10-03 NOTE — Progress Notes (Addendum)
Subjective:    CC: DM  HPI:  Diabetes - no hypoglycemic events. No wounds or sores that are not healing well. No increased thirst or urination. Checking glucose at home. Taking medications as prescribed without any side effects.  Did have significant difficulty in getting her Victoza approved.  Try to increase her dose when I saw her in February.  She is up to 1.8 mg which is fantastic.  Hypertension- Pt denies chest pain, SOB, dizziness, or heart palpitations.  Taking meds as directed w/o problems.  Denies medication side effects.  Unfortunately, her losartan HCT has been recalled and she will need a new prescription sent to the pharmacy.  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  Job is still so stressful that she is been working 12-hour days and she has not been able to make it to the gym even though she is paid for membership.  Past medical history, Surgical history, Family history not pertinant except as noted below, Social history, Allergies, and medications have been entered into the medical record, reviewed, and corrections made.   Review of Systems: No fevers, chills, night sweats, weight loss, chest pain, or shortness of breath.   Objective:    General: Well Developed, well nourished, and in no acute distress.  Neuro: Alert and oriented x3, extra-ocular muscles intact, sensation grossly intact.  HEENT: Normocephalic, atraumatic  Skin: Warm and dry, no rashes. Cardiac: Regular rate and rhythm, no murmurs rubs or gallops, no lower extremity edema.  Respiratory: Clear to auscultation bilaterally. Not using accessory muscles, speaking in full sentences.   Impression and Recommendations:    DM -all controlled even though she was out of her Victoza for a little over a month.  Though she was able to take the [place].  He is back on both medications and doing well.  Now that she is changing jobs she should have more time to do a lot more self-care and start  exercising more regularly. Lab Results  Component Value Date   HGBA1C 6.4 (A) 10/03/2017    HTN -controlled.  Unfortunately, will have to change to Benicar HCT since the losartan HCT is currently taking has been recalled.    Stress- she is starting a new job.  I am very hopeful for her that this will really give her the chance to reduce her stress.

## 2017-10-03 NOTE — Addendum Note (Signed)
Addended by: Beatrice Lecher D on: 10/03/2017 05:38 PM   Modules accepted: Orders

## 2017-10-04 ENCOUNTER — Telehealth: Payer: Self-pay | Admitting: Family Medicine

## 2017-10-04 ENCOUNTER — Ambulatory Visit: Payer: 59 | Admitting: Family Medicine

## 2017-10-04 MED ORDER — LIRAGLUTIDE 18 MG/3ML ~~LOC~~ SOPN: 1.8000 mg | PEN_INJECTOR | Freq: Every morning | SUBCUTANEOUS | 5 refills | Status: DC

## 2017-10-04 NOTE — Telephone Encounter (Signed)
Pt notified new rx sent to pharmacy. KG LPN

## 2017-10-04 NOTE — Telephone Encounter (Signed)
Olmesartan has been approved and pharmacy notified.

## 2017-10-04 NOTE — Telephone Encounter (Signed)
New rx sent

## 2017-10-04 NOTE — Telephone Encounter (Signed)
Pharmacy and patient calls in regards to her Victoza. States wrong dose was sent in, she is on the 1.8 dosage. Can you please send that in with 3 pens per patient.

## 2017-11-03 ENCOUNTER — Other Ambulatory Visit: Payer: Self-pay | Admitting: *Deleted

## 2017-11-03 DIAGNOSIS — I10 Essential (primary) hypertension: Secondary | ICD-10-CM

## 2017-11-03 MED ORDER — METOPROLOL SUCCINATE ER 50 MG PO TB24: 50.0000 mg | ORAL_TABLET | Freq: Every day | ORAL | 1 refills | Status: DC

## 2017-11-30 ENCOUNTER — Encounter: Payer: Self-pay | Admitting: Family Medicine

## 2017-12-01 ENCOUNTER — Telehealth: Payer: Self-pay | Admitting: Family Medicine

## 2017-12-01 ENCOUNTER — Encounter: Payer: Self-pay | Admitting: Family Medicine

## 2017-12-01 MED ORDER — EMPAGLIFLOZIN 10 MG PO TABS: 10.0000 mg | ORAL_TABLET | Freq: Every day | ORAL | 3 refills | Status: DC

## 2017-12-01 NOTE — Telephone Encounter (Signed)
Please call pt: I received notice of denial of her Wilder Glade from her insurance company.  They will pay for Jardiance or Invokana.  I will send over new prescription for Jardiance and in place of the Farxiga.

## 2017-12-01 NOTE — Telephone Encounter (Signed)
Called patient and LM of MD instructions. Call back number provided if any questions. KG LPN

## 2017-12-04 ENCOUNTER — Encounter: Payer: Self-pay | Admitting: Family Medicine

## 2017-12-22 ENCOUNTER — Telehealth: Payer: Self-pay | Admitting: Family Medicine

## 2017-12-22 MED ORDER — LOSARTAN POTASSIUM-HCTZ 100-25 MG PO TABS: 0.5000 | ORAL_TABLET | Freq: Every day | ORAL | 1 refills | Status: DC

## 2017-12-22 NOTE — Telephone Encounter (Signed)
Please call patient and let her know that unfortunately her current blood pressure medication is actually on back order so I am going to send over a new one.  It will technically be a higher strength so she will just need to cut them in half.  The instruction should be on the bottle beside take one half a day but I just wanted her to make sure that she pays attention to that when she picks it up not thinking it is the same old medication.

## 2017-12-22 NOTE — Telephone Encounter (Signed)
Pt advised,verbalized understanding. 

## 2018-01-04 ENCOUNTER — Ambulatory Visit (INDEPENDENT_AMBULATORY_CARE_PROVIDER_SITE_OTHER): Payer: 59 | Admitting: Family Medicine

## 2018-01-04 ENCOUNTER — Ambulatory Visit: Payer: 59 | Admitting: Family Medicine

## 2018-01-04 ENCOUNTER — Encounter: Payer: Self-pay | Admitting: Family Medicine

## 2018-01-04 VITALS — BP 136/78 | HR 98 | Ht 64.0 in | Wt 220.0 lb

## 2018-01-04 DIAGNOSIS — Z114 Encounter for screening for human immunodeficiency virus [HIV]: Secondary | ICD-10-CM | POA: Diagnosis not present

## 2018-01-04 DIAGNOSIS — E119 Type 2 diabetes mellitus without complications: Secondary | ICD-10-CM

## 2018-01-04 DIAGNOSIS — I10 Essential (primary) hypertension: Secondary | ICD-10-CM

## 2018-01-04 LAB — POCT GLYCOSYLATED HEMOGLOBIN (HGB A1C): Hemoglobin A1C: 6.3 % — AB (ref 4.0–5.6)

## 2018-01-04 NOTE — Progress Notes (Signed)
Subjective:    CC: DM and BP  HPI:  Hypertension- Pt denies chest pain, SOB, dizziness, or heart palpitations.  Taking meds as directed w/o problems.  Denies medication side effects.    Diabetes - no hypoglycemic events. No wounds or sores that are not healing well. No increased thirst or urination. Checking glucose at home. Taking medications as prescribed without any side effects.   Past medical history, Surgical history, Family history not pertinant except as noted below, Social history, Allergies, and medications have been entered into the medical record, reviewed, and corrections made.   Review of Systems: No fevers, chills, night sweats, weight loss, chest pain, or shortness of breath.   Objective:    General: Well Developed, well nourished, and in no acute distress.  Neuro: Alert and oriented x3, extra-ocular muscles intact, sensation grossly intact.  HEENT: Normocephalic, atraumatic  Skin: Warm and dry, no rashes. Cardiac: Regular rate and rhythm, no murmurs rubs or gallops, no lower extremity edema.  Respiratory: Clear to auscultation bilaterally. Not using accessory muscles, speaking in full sentences.   Impression and Recommendations:    HTN - Well controlled. Continue current regimen. Follow up in  35month.   DM - Patient would like to lose weight and was recently diagnosed with diabetes and she is doing well and her A1c looks great at 6.3 but would like some additional education and information to really get this under best control possible so that she can come off the medication.  Referral placed for nutrition.  Due for BMP.  Follow-up in 3 months.

## 2018-01-05 LAB — BASIC METABOLIC PANEL WITH GFR
BUN: 14 mg/dL (ref 7–25)
CALCIUM: 9.2 mg/dL (ref 8.6–10.2)
CO2: 23 mmol/L (ref 20–32)
CREATININE: 0.89 mg/dL (ref 0.50–1.10)
Chloride: 105 mmol/L (ref 98–110)
GFR, EST NON AFRICAN AMERICAN: 78 mL/min/{1.73_m2} (ref >=60)
GFR, Est African American: 91 mL/min/{1.73_m2} (ref >=60)
GLUCOSE: 119 mg/dL — AB (ref 65–99)
Potassium: 3.6 mmol/L (ref 3.5–5.3)
SODIUM: 143 mmol/L (ref 135–146)

## 2018-01-05 LAB — HIV ANTIBODY (ROUTINE TESTING W REFLEX): HIV: NONREACTIVE

## 2018-01-05 NOTE — Progress Notes (Signed)
All labs are normal.

## 2018-02-05 ENCOUNTER — Ambulatory Visit: Payer: 59 | Admitting: Family Medicine

## 2018-03-26 ENCOUNTER — Encounter: Payer: Self-pay | Admitting: Family Medicine

## 2018-03-30 ENCOUNTER — Encounter: Payer: Self-pay | Admitting: Physician Assistant

## 2018-03-30 ENCOUNTER — Ambulatory Visit (INDEPENDENT_AMBULATORY_CARE_PROVIDER_SITE_OTHER): Payer: 59 | Admitting: Physician Assistant

## 2018-03-30 VITALS — BP 130/84 | HR 84 | Temp 97.7°F | Ht 64.0 in | Wt 222.0 lb

## 2018-03-30 DIAGNOSIS — H6983 Other specified disorders of Eustachian tube, bilateral: Secondary | ICD-10-CM

## 2018-03-30 DIAGNOSIS — J209 Acute bronchitis, unspecified: Secondary | ICD-10-CM | POA: Diagnosis not present

## 2018-03-30 DIAGNOSIS — J0140 Acute pansinusitis, unspecified: Secondary | ICD-10-CM | POA: Diagnosis not present

## 2018-03-30 MED ORDER — AZITHROMYCIN 250 MG PO TABS
ORAL_TABLET | ORAL | 0 refills | Status: DC
Start: 2018-03-30 — End: 2018-04-26

## 2018-03-30 MED ORDER — METHYLPREDNISOLONE SODIUM SUCC 125 MG IJ SOLR
125.0000 mg | Freq: Once | INTRAMUSCULAR | Status: AC
  Administered 2018-03-30: 125 mg via INTRAMUSCULAR

## 2018-03-30 MED ORDER — PREDNISONE 50 MG PO TABS: ORAL_TABLET | ORAL | 0 refills | Status: DC

## 2018-03-30 MED ORDER — FLUTICASONE PROPIONATE 50 MCG/ACT NA SUSP: 2.0000 | Freq: Every day | NASAL | 6 refills | Status: DC

## 2018-03-30 NOTE — Patient Instructions (Signed)
Stop amoxicillin. Start zpak.  Start prednisone in the next few days if not improving.  Consider flonase 2 sprays each nostril.

## 2018-03-30 NOTE — Progress Notes (Signed)
Subjective:    Patient ID: Ponciano Ort, female    DOB: 12-25-1970, 47 y.o.   MRN: [id number]  HPI Patient is a 47 year old female who presents to the clinic to follow-up after 03/21/2018 urgent care visit for acute bacterial bronchitis, left otitis media, acute maxillary sinusitis. Pt has been sick for 4 months. She was given amoxicillin.  She does feel 20 to 30% better.  She continues to have lots of production with her cough.  She denies any wheezing.  She continues to have a lot of sinus pressure and bilateral ear popping.  She continues to not be able to hear out of her left ear.  She denies any fever, chills, wheezing.  .. Active Ambulatory Problems    Diagnosis Date Noted  . POLYCYSTIC OVARIAN DISEASE 05/06/2008  . Essential hypertension, benign 12/27/2011  . BMI 34.0-34.9,adult 12/27/2011  . Hyperlipidemia 12/27/2011  . Diabetes type 2, controlled ([place]) 12/27/2011  . Idiopathic scoliosis 10/11/2012  . Lumbar pars defect 10/15/2012  . Reaction, situational, acute, to stress 10/16/2013  . Plantar fasciitis, bilateral 01/08/2014  . Left knee pain 09/23/2015   Resolved Ambulatory Problems    Diagnosis Date Noted  . SINUSITIS, MAXILLARY, ACUTE 05/06/2008  . BRONCHITIS, ACUTE 05/06/2008   Past Medical History:  Diagnosis Date  . Gestational diabetes mellitus in childbirth       Review of Systems See HPI.     Objective:   Physical Exam  Constitutional: She is oriented to person, place, and time. She appears well-developed and well-nourished.  HENT:  Head: Normocephalic and atraumatic.  Right Ear: External ear normal.  Left Ear: External ear normal.  Left TM retracted a bit. Some effusion at the base.  Tenderness over maxillary sinuses.    Eyes: Right eye exhibits no discharge. Left eye exhibits no discharge.  Cardiovascular: Normal rate and regular rhythm.  Pulmonary/Chest: Effort normal and breath sounds normal. She has no wheezes.  Diffuse rhonchi at bilateral  apex.   Lymphadenopathy:    She has cervical adenopathy.  Neurological: She is alert and oriented to person, place, and time.  Skin:  rosey cheeks.   Psychiatric: She has a normal mood and affect. Her behavior is normal.          Assessment & Plan:  Marland KitchenMarland KitchenDiagnoses and all orders for this visit:  Acute non-recurrent pansinusitis -     azithromycin (ZITHROMAX) 250 MG tablet; Take 2 tablets now and then one tablet for 4 days. -     predniSONE (DELTASONE) 50 MG tablet; One tab PO daily for 5 days. -     fluticasone (FLONASE) 50 MCG/ACT nasal spray; Place 2 sprays into both nostrils daily. -     methylPREDNISolone sodium succinate (SOLU-MEDROL) 125 mg/2 mL injection 125 mg  ETD (Eustachian tube dysfunction), bilateral -     predniSONE (DELTASONE) 50 MG tablet; One tab PO daily for 5 days. -     fluticasone (FLONASE) 50 MCG/ACT nasal spray; Place 2 sprays into both nostrils daily.  Acute bronchitis, unspecified organism -     azithromycin (ZITHROMAX) 250 MG tablet; Take 2 tablets now and then one tablet for 4 days. -     predniSONE (DELTASONE) 50 MG tablet; One tab PO daily for 5 days. -     methylPREDNISolone sodium succinate (SOLU-MEDROL) 125 mg/2 mL injection 125 mg   I do think there is lots of residual inflammation going on.  My concern is that amoxicillin is not strong enough.  We will  send over Z-Pak.  Patient was given Solu-Medrol shot in office today.  Start oral prednisone tomorrow and burst form.  Did encourage some Flonase to help with eustachian tube dysfunction.  They hydrated and rest.  Patient should be feeling better in the next few days.

## 2018-04-09 ENCOUNTER — Ambulatory Visit: Payer: 59 | Admitting: Family Medicine

## 2018-04-20 ENCOUNTER — Encounter: Payer: Self-pay | Admitting: Physician Assistant

## 2018-04-27 ENCOUNTER — Ambulatory Visit (INDEPENDENT_AMBULATORY_CARE_PROVIDER_SITE_OTHER): Payer: 59

## 2018-04-27 ENCOUNTER — Encounter: Payer: Self-pay | Admitting: Family Medicine

## 2018-04-27 ENCOUNTER — Ambulatory Visit (INDEPENDENT_AMBULATORY_CARE_PROVIDER_SITE_OTHER): Payer: 59 | Admitting: Family Medicine

## 2018-04-27 VITALS — BP 138/79 | HR 88 | Ht 64.0 in | Wt 221.0 lb

## 2018-04-27 DIAGNOSIS — E119 Type 2 diabetes mellitus without complications: Secondary | ICD-10-CM | POA: Diagnosis not present

## 2018-04-27 DIAGNOSIS — Z1231 Encounter for screening mammogram for malignant neoplasm of breast: Secondary | ICD-10-CM

## 2018-04-27 DIAGNOSIS — Z6837 Body mass index (BMI) 37.0-37.9, adult: Secondary | ICD-10-CM

## 2018-04-27 DIAGNOSIS — I10 Essential (primary) hypertension: Secondary | ICD-10-CM | POA: Diagnosis not present

## 2018-04-27 LAB — POCT GLYCOSYLATED HEMOGLOBIN (HGB A1C): HEMOGLOBIN A1C: 6.2 % — AB (ref 4.0–5.6)

## 2018-04-27 MED ORDER — LOSARTAN POTASSIUM-HCTZ 100-25 MG PO TABS: 0.5000 | ORAL_TABLET | Freq: Every day | ORAL | 1 refills | Status: DC

## 2018-04-27 MED ORDER — EMPAGLIFLOZIN 10 MG PO TABS: 10.0000 mg | ORAL_TABLET | Freq: Every day | ORAL | 3 refills | Status: DC

## 2018-04-27 NOTE — Progress Notes (Signed)
Subjective:    CC: DM, HTN   HPI:  Diabetes - no hypoglycemic events. No wounds or sores that are not healing well. No increased thirst or urination. Checking glucose at home. Taking medications as prescribed without any side effects.  Hypertension- Pt denies chest pain, SOB, dizziness, or heart palpitations.  Taking meds as directed w/o problems.  Denies medication side effects.    Obesity/BMI 37-he is just feeling a little stressed this year.  Her husband is a Equities trader and he just has a lot going on college visits concerts etc.  But she does have a new job and so she is been able to be home and cook dinner.  She feels like she is doing a better job and began to take care of herself and eat more healthy she is been trying to walk more at work  Past medical history, Surgical history, Family history not pertinant except as noted below, Social history, Allergies, and medications have been entered into the medical record, reviewed, and corrections made.   Review of Systems: No fevers, chills, night sweats, weight loss, chest pain, or shortness of breath.   Objective:    General: Well Developed, well nourished, and in no acute distress.  Neuro: Alert and oriented x3, extra-ocular muscles intact, sensation grossly intact.  HEENT: Normocephalic, atraumatic  Skin: Warm and dry, no rashes. Cardiac: Regular rate and rhythm, no murmurs rubs or gallops, no lower extremity edema.  Respiratory: Clear to auscultation bilaterally. Not using accessory muscles, speaking in full sentences.   Impression and Recommendations:    DM - Well controlled. Continue current regimen. Follow up in  40month.  Due for fasting labs next time.   HTN  - Well controlled. Continue current regimen. Follow up in  4 months.  BMI 37-it sounds like she is made some really fantastic changes and eating more regularly eating more healthy and trying to walk more.  Encouraged her to just continue to push the exercise especially that  she now has a more sedentary job than her previous position.  She feels like she is at a plateau with her weight she could certainly check with her insurance to see if they might cover some the newer weight loss medications.  Encouraged her to schedule her mammogram.  Ordered so that she can have it done today if she has time.

## 2018-04-30 NOTE — Telephone Encounter (Signed)
This medication or product was previously approved on [id number] through 08/23/2018. You will be able to fill a prescription for this medication at your pharmacy. If your pharmacy has questions regarding the processing of your prescription, please have them call the [facility] pharmacy help desk at [phone]

## 2018-05-29 ENCOUNTER — Encounter: Payer: Self-pay | Admitting: Family Medicine

## 2018-05-30 MED ORDER — SEMAGLUTIDE(0.25 OR 0.5MG/DOS) 2 MG/1.5ML ~~LOC~~ SOPN: PEN_INJECTOR | SUBCUTANEOUS | 2 refills | Status: AC

## 2018-05-30 NOTE — Telephone Encounter (Signed)
Lets try Ozempic.  It will likely need a PA but if we can get it covered and it works better for her then I think that that is the best option. Once get PA we can give her coupone card to take to pharmacy

## 2018-06-04 NOTE — Telephone Encounter (Signed)
Received a fax that Megan [name] was approved from 05/31/2018 through 06/01/2019. Pharmacy aware and form sent to scan.

## 2018-06-06 NOTE — Telephone Encounter (Addendum)
This medication or product was previously approved on [id number] from 2018-06-05 to 2019-06-06. **Please note: Formulary lowering, tiering exception, cost reduction and/or pre-benefit determination review (including prospective Medicare hospice reviews) requests cannot be requested using this method of submission. Please contact us at [phone] instead.   Patient is aware. She states that [facility] has called her to let her know. She reports she will call the pharmacy to see if its approved.   I called and spoke with Jonelle Sidle at [facility] and it was approved. She will call the patient.

## 2018-06-07 ENCOUNTER — Other Ambulatory Visit: Payer: Self-pay | Admitting: *Deleted

## 2018-06-07 ENCOUNTER — Other Ambulatory Visit: Payer: Self-pay | Admitting: Family Medicine

## 2018-06-07 MED ORDER — LOSARTAN POTASSIUM-HCTZ 100-25 MG PO TABS: 0.5000 | ORAL_TABLET | Freq: Every day | ORAL | 1 refills | Status: DC

## 2018-06-08 ENCOUNTER — Other Ambulatory Visit: Payer: Self-pay | Admitting: *Deleted

## 2018-06-08 DIAGNOSIS — I10 Essential (primary) hypertension: Secondary | ICD-10-CM

## 2018-06-08 MED ORDER — LOSARTAN POTASSIUM 50 MG PO TABS: 50.0000 mg | ORAL_TABLET | Freq: Every day | ORAL | 1 refills | Status: DC

## 2018-06-08 MED ORDER — HYDROCHLOROTHIAZIDE 12.5 MG PO TABS: 12.5000 mg | ORAL_TABLET | Freq: Every day | ORAL | 1 refills | Status: DC

## 2018-06-08 NOTE — Telephone Encounter (Signed)
Called pt and informed her that this medication is on back order and she doesn't have to cut this in half however, she will have to take two (2) separate tablets. She voiced understanding.Maryruth Eve, Lahoma Crocker, CMA

## 2018-06-08 NOTE — Progress Notes (Signed)
Losartan/HCT is on backorder .Marland KitchenElouise [name], [place]

## 2018-06-08 NOTE — Telephone Encounter (Signed)
Losartan/hct 100-25 is on backorder. Separate prescriptions sent for losartan and hct.Megan [name], Megan [name]

## 2018-06-12 ENCOUNTER — Encounter: Payer: Self-pay | Admitting: Physician Assistant

## 2018-06-12 DIAGNOSIS — H6983 Other specified disorders of Eustachian tube, bilateral: Secondary | ICD-10-CM

## 2018-06-15 MED ORDER — METHYLPREDNISOLONE 4 MG PO TBPK: ORAL_TABLET | ORAL | 0 refills | Status: DC

## 2018-06-15 NOTE — Addendum Note (Signed)
Addended by: Donella Stade on: 06/15/2018 07:56 AM   Modules accepted: Orders

## 2018-06-19 ENCOUNTER — Encounter: Payer: Self-pay | Admitting: Family Medicine

## 2018-07-31 ENCOUNTER — Other Ambulatory Visit: Payer: Self-pay | Admitting: Family Medicine

## 2018-07-31 DIAGNOSIS — I10 Essential (primary) hypertension: Secondary | ICD-10-CM

## 2018-08-16 ENCOUNTER — Telehealth: Payer: Self-pay | Admitting: Family Medicine

## 2018-08-16 DIAGNOSIS — I10 Essential (primary) hypertension: Secondary | ICD-10-CM

## 2018-08-16 DIAGNOSIS — E119 Type 2 diabetes mellitus without complications: Secondary | ICD-10-CM

## 2018-08-16 DIAGNOSIS — E7800 Pure hypercholesterolemia, unspecified: Secondary | ICD-10-CM

## 2018-08-16 NOTE — Telephone Encounter (Signed)
Pt needs lab order sent down.. wants to have labs before visit on 4/20 Thanks

## 2018-08-16 NOTE — Telephone Encounter (Signed)
a1c added on to orders.Elouise Munroe, [place]

## 2018-08-27 ENCOUNTER — Ambulatory Visit (INDEPENDENT_AMBULATORY_CARE_PROVIDER_SITE_OTHER): Payer: 59 | Admitting: Family Medicine

## 2018-08-27 ENCOUNTER — Encounter: Payer: Self-pay | Admitting: Family Medicine

## 2018-08-27 VITALS — BP 145/76 | HR 87 | Temp 96.4°F | Ht 64.0 in | Wt 218.0 lb

## 2018-08-27 DIAGNOSIS — I10 Essential (primary) hypertension: Secondary | ICD-10-CM | POA: Diagnosis not present

## 2018-08-27 DIAGNOSIS — E7800 Pure hypercholesterolemia, unspecified: Secondary | ICD-10-CM

## 2018-08-27 DIAGNOSIS — E282 Polycystic ovarian syndrome: Secondary | ICD-10-CM | POA: Diagnosis not present

## 2018-08-27 DIAGNOSIS — E119 Type 2 diabetes mellitus without complications: Secondary | ICD-10-CM

## 2018-08-27 MED ORDER — SEMAGLUTIDE (1 MG/DOSE) 2 MG/1.5ML ~~LOC~~ SOPN: 1.0000 mg | PEN_INJECTOR | SUBCUTANEOUS | 3 refills | Status: DC

## 2018-08-27 MED ORDER — METOPROLOL SUCCINATE ER 50 MG PO TB24: 50.0000 mg | ORAL_TABLET | Freq: Every day | ORAL | 1 refills | Status: DC

## 2018-08-27 NOTE — Progress Notes (Signed)
She reports that her BS have been high. She is unsure if the medication is working as effectively for her. She also reports experiencing HA since being on the medication about 5-6 days out of 7. She stated that her BS are running around 180-260 range. She has had maybe 1 good reading once a week over the past few weeks.Maryruth Eve, Lahoma Crocker, CMA

## 2018-08-27 NOTE — Progress Notes (Signed)
Virtual Visit via Video Note  I connected with [employer] on 08/27/18 at  9:50 AM EDT by a video enabled telemedicine application and verified that I am speaking with the correct person using two identifiers.   I discussed the limitations of evaluation and management by telemedicine and the availability of in person appointments. The patient expressed understanding and agreed to proceed.  Subjective:    CC: F/U DM  HPI: Diabetes - no hypoglycemic events. No wounds or sores that are not healing well. No increased thirst or urination. Checking glucose at home. Taking medications as prescribed without any side effects.  She reports that her BS have been high. She is unsure if the medication is working as effectively for her. She also reports experiencing HA since being on the medication about 5-6 days out of 7. She stated that her BS are running around 180-260 range. She has had maybe 1 good reading once a week over the past few weeks. Has been having more HA.    Hypertension- Pt denies chest pain, SOB, dizziness, or heart palpitations.  Taking meds as directed w/o problems.  Denies medication side effects.    She has been working from [employer] the last 3 weeks. Her son is in his senior year and doing well.   Hyperlipidemia- tolerating statin well without any side effects or myalgias.  Past medical history, Surgical history, Family history not pertinant except as noted below, Social history, Allergies, and medications have been entered into the medical record, reviewed, and corrections made.   Review of Systems: No fevers, chills, night sweats, weight loss, chest pain, or shortness of breath.   Objective:    General: Speaking clearly in complete sentences without any shortness of breath.  Alert and oriented x3.  Normal judgment. No apparent acute distress. Well groomed.  Well groomed.     Impression and Recommendations:    DM - discussed options.  Sugars better but not well controlled.   Will inc Ozempic to 13m. Really work on diet and exercise.   HTN - Bp high today.  enoucraged her to monitor.  BPS have been up and down on her maching.     PCOS - Stable. No changes.   Hyperlipidemia-last LDL was 80 on her statin little over a year ago.  Due to recheck labs.  She said she will probably wait until June or July to go home once the state home restrictions have been lifted.    I discussed the assessment and treatment plan with the patient. The patient was provided an opportunity to ask questions and all were answered. The patient agreed with the plan and demonstrated an understanding of the instructions.   The patient was advised to call back or seek an in-person evaluation if the symptoms worsen or if the condition fails to improve as anticipated.   CBeatrice Lecher MD

## 2018-08-28 ENCOUNTER — Encounter: Payer: Self-pay | Admitting: Family Medicine

## 2018-08-30 ENCOUNTER — Encounter: Payer: Self-pay | Admitting: Family Medicine

## 2018-08-30 ENCOUNTER — Ambulatory Visit: Payer: 59 | Admitting: Family Medicine

## 2018-08-30 VITALS — BP 128/77 | HR 96 | Temp 97.7°F | Wt 219.0 lb

## 2018-08-30 DIAGNOSIS — M659 Synovitis and tenosynovitis, unspecified: Secondary | ICD-10-CM | POA: Diagnosis not present

## 2018-08-30 DIAGNOSIS — G5622 Lesion of ulnar nerve, left upper limb: Secondary | ICD-10-CM | POA: Diagnosis not present

## 2018-08-30 DIAGNOSIS — M25532 Pain in left wrist: Secondary | ICD-10-CM | POA: Diagnosis not present

## 2018-08-30 MED ORDER — DICLOFENAC SODIUM 1 % TD GEL: 2.0000 g | Freq: Four times a day (QID) | TRANSDERMAL | 11 refills | Status: DC

## 2018-08-30 NOTE — Patient Instructions (Addendum)
Thank you for coming in today. Use a elbow pad.  Consider keyboard pad adjustment or even padded cycling gloves.  Work on the exercises we discussed.  Allow the tendon to go from short to long slowly.  Do about 30 resp 2x daily.  Apply the diclofenac gel up to 4x daily.   Use the hard splint as needed especially with heavy duty activity.   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 It happens when one of the nerves that runs along the inside of the elbow joint (ulnar nerve) becomes irritated. This condition is usually caused by repeated arm motions that are done during sports or work-related activities. What are the causes? This condition may be caused by:  Increased pressure on the ulnar nerve at the elbow, arm, or forearm. This can result from: ? Irritation caused by repeated elbow bending. ? Poorly healed elbow fractures. ? Tumors in the elbow. These are usually noncancerous (benign). ? Scar tissue that develops in the elbow after an injury. ? Bony growths (spurs) near the ulnar nerve.  Stretching of the nerve due to loose elbow ligaments.  Trauma to the nerve at the elbow. What increases the risk? The following factors may make you more likely to develop this condition:  Doing manual labor that requires frequent bending of the elbow.  Playing sports that include repeated or strenuous throwing motions, such as baseball.  Playing contact sports, such as football or lacrosse.  Not warming up properly before activities.  Having diabetes.  Having an underactive thyroid (hypothyroidism). What are the signs or symptoms? Symptoms of this condition include:  Clumsiness or weakness of the hand.  Tenderness of the inner elbow.  Aching or soreness of the  inner elbow, forearm, or fingers, especially the little finger or the ring finger.  Increased pain when forcing the elbow to bend.  Reduced control when throwing objects.  Tingling, numbness, or a burning feeling inside the forearm or in part of the hand or fingers, especially the little finger or the ring finger.  Sharp pains that shoot from the elbow down to the wrist and hand.  The inability to grip or pinch hard. How is this diagnosed? This condition is diagnosed based on:  Your symptoms and medical history. Your health care provider will also ask for details about any injury.  A physical exam. You may also have tests, including:  Electromyogram (EMG). This test measures electrical signals sent by your nerves into the muscles.  Nerve conduction study. This test measures how well electrical signals pass through your nerves.  Imaging tests, such as X-rays, ultrasound, and MRI. These tests check for possible causes of your condition. How is this treated? This condition may be treated by:  Stopping the activities that are causing your symptoms to get worse.  Icing and taking medicines to reduce pain and swelling.  Wearing a splint to prevent your elbow from bending, or wearing an elbow pad where the ulnar nerve is closest to the skin.  Working with a physical therapist in less severe cases. This may help to: ? Decrease your symptoms. ? Improve the strength and range of motion of your elbow, forearm, and hand. If these treatments do not help, surgery may be needed. Follow these instructions at home: If  you have a splint:  Wear the splint as told by your health care provider. Remove it only as told by your health care provider.  Loosen the splint if your fingers tingle, become numb, or turn cold and blue.  Keep the splint clean.  If the splint is not waterproof: ? Do not let it get wet. ? Cover it with a watertight covering when you take a bath or shower. Managing pain,  stiffness, and swelling   If directed, put ice on the injured area: ? Put ice in a plastic bag. ? Place a towel between your skin and the bag. ? Leave the ice on for 20 minutes, 2-3 times a day.  Move your fingers often to avoid stiffness and to lessen swelling.  Raise (elevate) the injured area above the level of your heart while you are sitting or lying down. General instructions  Take over-the-counter and prescription medicines only as told by your health care provider.  Do any exercise or physical therapy as told by your health care provider.  Do not drive or use heavy machinery while taking prescription pain medicine.  If you were given an elbow pad, wear it as told by your health care provider.  Keep all follow-up visits as told by your health care provider. This is important. Contact a health care provider if:  Your symptoms get worse.  Your symptoms do not get better with treatment.  You have new pain.  Your hand on the injured side feels numb or cold. Summary  Cubital tunnel syndrome is a condition that causes pain and weakness of the forearm and hand.  You are more likely to develop this condition if you do work or play sports that involve repeated arm movements.  This condition is often treated by stopping repetitive activities, applying ice, and using anti-inflammatory medicines.  In rare cases, surgery may be needed. This information is not intended to replace advice given to you by your health care provider. Make sure you discuss any questions you have with your health care provider. Document Released: 04/25/2005 Document Revised: 09/11/2017 Document Reviewed: 09/11/2017 Elsevier Interactive Patient Education  2019 [place] and Flexor Carpi Radialis Tendinitis  The flexor carpi ulnaris (FCU) and the flexor carpi radialis (FCR) are tendons in your forearm. Tendons connect muscles to bones. The FCU is located on the pinkie side  of your forearm, and the FCR is located on the thumb side of your forearm. FCU tendinitis is inflammation of the FCU, and FCR tendinitis is inflammation of the FCR. These conditions cause wrist pain. What are the causes? These conditions may be caused by:  Repetitive motions or overuse (common).  Wear and tear. (common).  An injury.  Excessive exercise or strain.  Certain antibiotic medicines. In some cases, the cause may not be known. What increases the risk? These conditions are more likely to develop in:  People who play sports that involve constantly flexing or stretching the wrist and forearm, such as volleyball and water polo.  Older adults.  People with have a job that involves flexing the wrist over and over, such as people who work as Chiropractor, Psychologist, prison and probation services, Personnel officer.  People with certain health conditions, such as: ? Rheumatoid arthritis. ? Gout. ? Diabetes. What are the signs or symptoms? Symptoms of these conditions may develop gradually. Symptoms include:  Pain or tenderness in the wrist.  Pain when flexing or stretching the wrist.  Pain when gripping or lifting with  the palm of the hand.  Swelling. How is this diagnosed? This condition may be diagnosed based on:  Your symptoms.  Your medical history.  A physical exam. During the physical exam, you may be asked to move your hand, wrist, and arm in certain ways. In order to rule out another condition, your health care provider may order one or more of the following tests:  MRI to get detailed images of the body's soft tissues and detect tendon tears and inflammation.  Ultrasound to detect soft-tissue injuries, such as tears and inflammation of the ligaments or tendons. How is this treated? Treatment for this condition may include:  Rest. You should limit activities that cause your symptoms to get worse or flare up.  Heat and ice treatment. Both heat and cold can help to ease pain and may be applied  to the wrist or forearm as needed to reduce pain and inflammation.  Splint. You may need to wear a splint to keep your wrist and forearm from moving (keep them immobilized) until your symptoms improve.  Medicine. Your health care provider may prescribe steroids or other anti-inflammatory medicines, like ibuprofen, to temporarily ease your pain and other symptoms.  Physical therapy. Your health care provider may ask you to do exercises to maintain mobility and range of motion in your wrist. Follow these instructions at home: If you have a splint:  Wear it as told by your health care provider. Remove it only as told by your health care provider.  Loosen the splint if your fingers tingle, become numb, or turn cold and blue.  Do not let your splint get wet if it is not waterproof.  Keep the splint clean. Managing pain, stiffness, and swelling   If directed, apply ice to the injured area. ? Put ice in a plastic bag. ? Place a damp towel between your skin and the bag. ? Leave the ice on for 20 minutes, 2-3 times a day.  Move your fingers often to avoid stiffness and to lessen swelling.  Raise (elevate) the injured area above the level of your heart while you are sitting or lying down. Activity  Return to your normal activities as told by your health care provider. Ask your health care provider what activities are safe for you.  Do exercises as told by your health care provider. General instructions   Do not use any tobacco products, including cigarettes, chewing tobacco, or e-cigarettes. Tobacco can delay healing. If you need help quitting, ask your health care provider.  Take over-the-counter and prescription medicines only as told by your health care provider.  Keep all follow-up visits as told by your health care provider. This is important. How is this prevented?  Warm up and stretch before being active.  Cool down and stretch after being active.  Give your body time to  rest between periods of activity.  Make sure to use equipment that fits you.  Be safe and responsible while being active to avoid falls.  Do at least 150 minutes of moderate-intensity exercise each week, such as brisk walking or water aerobics.  Maintain physical fitness, including: ? Strength. ? Flexibility. ? Cardiovascular fitness. ? Endurance. Contact a health care provider if:  Your pain does not improve.  Your pain gets worse. Get help right away if:  Your pain is severe.  You cannot move your wrist. This information is not intended to replace advice given to you by your health care provider. Make sure you discuss any questions you  have with your health care provider. Document Released: 04/25/2005 Document Revised: 07/01/2016 Document Reviewed: 01/02/2015 Elsevier Interactive Patient Education  2019 [place] and Flexor Carpi Radialis Tendinitis Rehab Ask your health care provider which exercises are safe for you. Do exercises exactly as told by your health care provider and adjust them as directed. It is normal to feel mild stretching, pulling, tightness, or discomfort as you do these exercises, but you should stop right away if you feel sudden pain or your pain gets worse.Do not begin these exercises until told by your health care provider. Stretching These exercises warm up your muscles and joints and improve the movement and flexibility of your forearm. These exercises also help to relieve pain, numbness, and tingling.These exercises are done using your healthy forearm to help stretch the muscles in your injured forearm. Exercise A: Wrist flexion, passive 1. Extend your __________ arm in front of you, relax your wrist, and point your fingers downward. 2. Gently push on the back of your hand until you feel a gentle stretch on the top of your forearm. 3. Hold this position for __________ seconds. 4. Slowly return to the starting  position. Repeat __________ times. Complete this exercise __________ times a day. Exercise B: Wrist extension, passive 1. Extend your __________ arm in front of you and turn your palm upward. 2. Gently pull your palm and fingertips back so your fingers point downward. You should feel a gentle stretch on the palm-side of your forearm. 3. Hold this position for __________ seconds. 4. Slowly return to the starting position. Exercise C: Forearm rotation, palm up, passive 1. Sit with your __________ elbow bent to an "L" shape (90 degrees) with your forearm resting on a table. 2. Keeping your upper body and shoulder still, use your other hand to rotate your forearm palm up until you feel a gentle to moderate stretch. 3. Hold this position for __________ seconds. 4. Slowly release the stretch, and return to the starting position. Repeat __________ times. Complete this exercise __________ times a day. Exercise D: Forearm rotation, palm down, passive 1. Sit with your __________ elbow bent to an "L" shape (90 degrees) with your forearm resting on a table. 2. Keeping your upper body and shoulder still, use your other hand to rotate your forearm palm down until you feel a gentle to moderate stretch. 3. Hold this position for __________ seconds. 4. Slowly release the stretch, and return to the starting position. Repeat __________ times. Complete this exercise __________ times a day. Range of motion exercises These exercises warm up your muscles and joints and improve the movement and flexibility of your forearm. These exercises also help to relieve pain, numbness, and tingling.These exercises are done using the muscles in your injured forearm. Exercise E: Wrist flexion, active 1. With your fingers relaxed, bend your wrist forward as far as you can. 2. Hold this position for __________ seconds. 3. Slowly return to the starting position. Repeat __________ times. Complete this exercise __________ times a  day. Exercise F: Wrist extension, active 1. With your fingers relaxed, bend your wrist backward as far as you can. 2. Hold this position for __________ seconds. 3. Slowly return to the starting position. Repeat __________ times. Complete this exercise __________ times a day. Exercise G: Supination, active 1. Stand or sit with your arms at your sides. 2. Bend your __________ elbow to an "L" shape (90 degrees). 3. Turn your palm upward until you feel a gentle stretch on  the inside of your forearm. 4. Hold this position for __________ seconds. 5. Slowly return your palm to the starting position. Repeat __________ times. Complete this exercise __________ times a day. Exercise H: Pronation, active 1. Stand or sit with your arms at your sides. 2. Bend your __________ elbow to an "L" shape (90 degrees). 3. Turn your palm downward until you feel a gentle stretch on the top of your forearm. 4. Hold this position for __________ seconds. 5. Slowly return your palm to the starting position. Repeat __________ times. Complete this exercise __________ times a day. Strengthening exercises These exercises build strength and endurance in your wrist and forearm. Endurance is the ability to use your muscles for a long time, even after they get tired. Exercise I: Wrist flexors 1. Sit with your __________ forearm supported on a table and your hand resting palm-up over the edge of the table. Your elbow should be below the level of your shoulder. 2. Hold a __________ weight in your __________ hand. Or, hold a rubber exercise band or tube in both hands, keeping your hands at the same level and hip distance apart. There should be a slight tension in the exercise band or tube. 3. Slowly curl your hand up toward your forearm. 4. Hold this position for __________ seconds. 5. Slowly lower your hand back to the starting position. Repeat __________ times. Complete this exercise __________ times a day. Exercise J: Wrist  extensors 1. Sit with your __________ forearm supported on a table and your hand resting palm-down over the edge of the table. Your elbow should be below the level of your shoulder. 2. Hold a __________ weight in your __________ hand. Or, hold a rubber exercise band or tube in both hands, keeping your hands at the same level and hip distance apart. There should be a slight tension in the exercise band or tube. 3. Slowly curl your hand up toward your forearm. 4. Hold this position for __________ seconds. 5. Slowly lower your hand back to the starting position. Repeat __________ times. Complete this exercise __________ times a day. Exercise K: Ulnar deviators 1. Stand with a __________ weight in your __________ hand. Or, sit with your healthy hand supported, and hold onto a rubber exercises band or tube. There should be a slight tension in the exercise band or tube. 2. Move your __________ wrist so your pinkie travels toward your forearm and your thumb moves away from your forearm. 3. Hold this position for __________ seconds. 4. Slowly lower your wrist to the starting position. Repeat __________ times. Complete this exercise __________ times a day. Exercise L: Radial deviators 1. Stand with a __________ weight in your __________ hand. Or, sit and hold onto a rubber exercise band or tube while your __________ arm is supported on a table and the other arm is below the table. There should be a slight tension in the exercise band or tube. ? If you are holding a weight, raise your __________ hand so your thumb moves toward your forearm at a comfortable height. You will not need to raise your hand very far. ? If you are holding an exercise band or tube, pull on it. 2. Hold this position for __________ seconds. 3. Slowly lower your wrist to the starting position. Repeat __________ times. Complete this exercise __________ times a day. Exercise M: Forearm rotation, palm up 1. Sit with your __________  forearm supported on a table and your hand resting palm-down. Your elbow should be at your side,  below the level of your shoulder, and bent to an "L" shape (about 90 degrees). Keep your wrist stable. Do not allow it to move backward or forward during the exercise. 2. Gently hold a lightweight hammer with your __________ hand. 3. Without moving your elbow or wrist, slowly rotate your palm upward to a thumbs-up position. 4. Hold this position for __________ seconds. 5. Slowly return your forearm to the starting position. Repeat __________ times. Complete this exercise __________ times a day. Exercise N: Forearm rotation, palm down 1. Sit with your __________ forearm supported on a table and your hand resting palm-up. Your elbow should be at your side, below the level of your shoulder, and bent to an "L" shape (about 90 degrees). Keep your wrist stable. Do not allow it to move backward or forward during the exercise. 2. Gently hold a lightweight hammer with your __________ hand. 3. Without moving your elbow or wrist, slowly rotate your palm and hand upward to a thumbs-up position. 4. Hold this position for __________ seconds. 5. Slowly return your forearm to the starting position. Repeat __________ times. Complete this exercise __________ times a day. Exercise O: Grip  1. Hold one of these items in your __________ hand: modelling clay, therapy putty, a dense sponge, a stress ball, or a large, rolled sock. 2. Squeeze as hard as you can without increasing any pain. 3. Hold this position for __________ seconds. 4. Slowly release your grip. Repeat __________ times. Complete this exercise __________ times a day. This information is not intended to replace advice given to you by your health care provider. Make sure you discuss any questions you have with your health care provider. Document Released: 12/29/2015 Document Revised: 12/31/2015 Document Reviewed: 12/29/2015 Elsevier Interactive Patient  Education  2019 [place] Tunnel Syndrome Guyon tunnel syndrome, also known as cyclist's palsy, is a disorder that causes pain, weakness, and numbness in the wrist and the hand. Pain occurs in the outer (ulnar) part of the wrist and the palm of the hand, including the ring finger and pinkie. This condition happens when a nerve in the arm and hand (ulnar nerve) is stretched or squeezed (compressed) at the base of the hand. Over time, you may start to have symptoms with just a small amount of stress to your hand or wrist. When it takes less stress to cause symptoms than it used to, this is called sensitization. Guyon tunnel syndrome is common among cyclists, because gripping and leaning on bicycle handlebars for long periods of time can cause this condition and make it worse over time. What are the causes? This condition may be caused by:  Repetitive pressure on the hands and wrists.  An injuryto the base of the hand that causes swelling or a break (fracture) in a wrist bone (hamate bone). What increases the risk? The following factors may make you more likely to develop this condition:  Having diabetes.  Having hypothyroidism.  Participating in activities that involve repeated impact, pressure, or vibration of the hands or wrists. These include certain sports, such as cycling, tennis, and martial arts.  Repeatedly bearing weight in your hands, such as when you use crutches.  Having gout or rheumatoid arthritis.  Having a ganglion cyst or lipoma near the base of the hand.  Having carpal tunnel syndrome. What are the signs or symptoms? Symptoms of this condition may include:  Tingling, numbness, or a burning feeling in the ulnar side of the palm and in the ring finger  and pinkie.  Pain in the hand or wrist. Pain may feel sharp, or it may feel like an ache.  Weakness in the hand. You may have difficulty gripping objects.  Involuntary bending of the ring finger and  pinkie toward the palm (claw hand). How is this diagnosed? This condition may be diagnosed based on:  A physical exam.  Your medical history.  Tests, such as: ? Nerve conduction test. This test checks the quality of signals along your ulnar nerve. ? X-rays. ? CT scan. ? Ultrasound. This uses sound waves to make an image of your affected area. How is this treated? Treatment for this condition may include:  Resting the injured area. This may include stopping or modifying any sports and physical activity for a period of time.  Icing the injured area.  Medicines that help to relieve pain and inflammation.  A splint to keep your wrist and hand in the proper position.  Physical therapy.  An injection of medicine (cortisone) that helps to reduce inflammation.  Surgery to relieve pressure on the ulnar nerve. This is done only in very severe cases.  Follow these instructions at home: If you have a splint:  Do not put pressure on any part of the splint until it is fully hardened. This may take several hours.  Wear it as told by your health care provider. Remove it only as told by your health care provider.  Loosen the splint if your fingers tingle, become numb, or turn cold and blue.  Do not let your splint get wet if it is not waterproof. ? If your splint is not waterproof, cover it with a watertight covering when you take a bath or shower.  Keep the splint clean.  Ask your health care provider when it is safe for you to drive. Managing pain, stiffness, and swelling  If directed, apply ice to the injured area: ? Put ice in a plastic bag. ? Place a towel between your skin and the bag. ? Leave the ice on for 20 minutes, 2-3 times a day. Activity  Return to your normal activities as told by your health care provider. Ask your health care provider what activities are safe for you.  When you do activities that cause stress to your hands and wrists, try wearing padded gloves.  Change your hand positions often, especially when you do these activities for a long time.  If physical therapy was prescribed, do exercises as told by your health care provider. General instructions  Take over-the-counter and prescription medicines only as told by your health care provider.  Keep all follow-up visits as told by your health care provider. This is important. Contact a health care provider if:  You have pain, tingling, or weakness that gets worse.  Your symptoms do not improve after 2 weeks of treatment. This information is not intended to replace advice given to you by your health care provider. Make sure you discuss any questions you have with your health care provider. Document Released: 04/25/2005 Document Revised: 12/23/2015 Document Reviewed: 01/05/2015 Elsevier Interactive Patient Education  2019 [employer].

## 2018-08-30 NOTE — Progress Notes (Signed)
Megan [name] is a 48 y.o. female who presents to [facility] today for bilateral wrist pain left worse than right.  Pain is been ongoing now for about 6 months.  She started a new job that requires a lot of typing.  She notes pain into the ulnar left hand and wrist and forearm.  She has some numbness and tingling into her fourth and fifth digits but the majority of her pain and discomfort is located at the volar ulnar wrist and forearm.  She is tried an over-the-counter brace which did not help.  She is tried some wrist pads at work which helps some.  She notes that her home set up is a little bit less ergonomic than her work set up.  She tries over-the-counter medications for pain which helps a little.  She is also tried some topical Biofreeze which helped a little.  Sometimes she will wake up at night with her hand numb and tingly as well.  She denies any injury no fevers chills nausea vomiting or diarrhea.   ROS:  As above  Exam:  BP 128/77    Pulse 96    Temp 97.7 F (36.5 C) (Oral)    Wt 219 lb (99.3 kg)    BMI 37.59 kg/m  Wt Readings from Last 5 Encounters:  08/30/18 219 lb (99.3 kg)  08/27/18 218 lb (98.9 kg)  04/27/18 221 lb (100.2 kg)  03/30/18 222 lb (100.7 kg)  01/04/18 220 lb (99.8 kg)   General: Well Developed, well nourished, and in no acute distress.  Neuro/Psych: Alert and oriented x3, extra-ocular muscles intact, able to move all 4 extremities, sensation grossly intact. Skin: Warm and dry, no rashes noted.  Respiratory: Not using accessory muscles, speaking in full sentences, trachea midline.  Cardiovascular: Pulses palpable, no extremity edema. Abdomen: Does not appear distended. MSK:  C-spine normal motion. Left shoulder normal motion. Left elbow normal motion.  Positive Tinel's at cubital tunnel. Left wrist normal-appearing normal motion.  Tender palpation at left ulnar wrist at flexor carpi ulnaris and Pisiform.   Positive Tinel's at Guyon's canal. Normal wrist strength. Normal hand motion sensation and strength.  Left elbow and wrist normal-appearing not particularly tender normal motion.    Lab and Radiology Results Limited musculoskeletal ultrasound left ulnar wrist reveals intact appearing flexor carpi ulnaris and normal-appearing ulnar nerve.  Palpation over Pisiform and flexor carpi ulnaris reproduces pain and paresthesias. Hypoechoic fluid located just superficial to carpal bones in ulnar wrist.  Cubital tunnel reveals intact appearing ulnar nerve with no subluxation.   Assessment and Plan: 48 y.o. 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  This is likely due to ergonomics and work environment.  Discussed options.  Plan for offloading pressure from ulnar wrist and medial elbow.  Recommend elbow pad and potential cycling gloves for wrist protection.  Additionally recommend modifying wrist pad at desk at work to have less pressure on the wrist and more pressure on the forearm.  We will additionally use home exercise program and diclofenac gel.  Ulnar gutter splint made and well fitted to patient.  She can use this as needed for heavy duty activity or when she is having more pain.  Plan for recheck in a few weeks if not improving.   PDMP not reviewed this encounter. No orders of the defined types were placed in this encounter.  Meds  ordered this encounter  Medications   diclofenac sodium (VOLTAREN) 1 % GEL    Sig: Apply 2 g topically 4 (four) times daily. To affected joint.    Dispense:  100 g    Refill:  11    Historical information moved to improve visibility of documentation.  Past Medical History:  Diagnosis Date   Gestational diabetes mellitus in childbirth    Past Surgical History:  Procedure Laterality Date   TONSILLECTOMY AND ADENOIDECTOMY     TYMPANOSTOMY TUBE PLACEMENT   1984   Social History   Tobacco Use   Smoking status: Never Smoker   Smokeless tobacco: Never Used  Substance Use Topics   Alcohol use: No   family history includes Diabetes in her maternal uncle, paternal aunt, and paternal aunt; Hypertension in her brother, father, mother, and sister; Peripheral vascular disease in her maternal grandmother.  Medications: Current Outpatient Medications  Medication Sig Dispense Refill   AMBULATORY NON FORMULARY MEDICATION One touch Delica 16B lancets   Use to test blood sugar once daily  Schedule a f/u appt 25 each 0   AMBULATORY NON FORMULARY MEDICATION One touch ultra 2 glucose system  Use to test once daily as directed  Please schedule f/u appt 1 each 0   atorvastatin (LIPITOR) 40 MG tablet Take 1 tablet (40 mg total) by mouth daily. 90 tablet 3   empagliflozin (JARDIANCE) 10 MG TABS tablet Take 10 mg by mouth daily. 30 tablet 3   hydrochlorothiazide (HYDRODIURIL) 12.5 MG tablet Take 1 tablet (12.5 mg total) by mouth daily. 90 tablet 1   losartan (COZAAR) 50 MG tablet Take 1 tablet (50 mg total) by mouth daily. 90 tablet 1   metoprolol succinate (TOPROL-XL) 50 MG 24 hr tablet Take 1 tablet (50 mg total) by mouth daily. Take with or immediately following a meal. 90 tablet 1   norgestrel-ethinyl estradiol (LO/OVRAL,CRYSELLE) 0.3-30 MG-MCG tablet Take 1 tablet by mouth daily.     Semaglutide, 1 MG/DOSE, (OZEMPIC, 1 MG/DOSE,) 2 MG/1.5ML SOPN Inject 1 mg into the skin once a week. 4 pen 3   diclofenac sodium (VOLTAREN) 1 % GEL Apply 2 g topically 4 (four) times daily. To affected joint. 100 g 11   No current facility-administered medications for this visit.    Allergies  Allergen Reactions   Metformin And Related Other (See Comments)    GI upset    Sertraline Nausea Only    sweats      Discussed warning signs or symptoms. Please see discharge instructions. Patient expresses understanding.

## 2018-11-05 LAB — HM DIABETES EYE EXAM

## 2018-11-15 ENCOUNTER — Encounter: Payer: Self-pay | Admitting: Family Medicine

## 2018-11-29 ENCOUNTER — Ambulatory Visit: Payer: 59 | Admitting: Family Medicine

## 2018-12-06 ENCOUNTER — Telehealth: Payer: 59 | Admitting: Family Medicine

## 2018-12-23 ENCOUNTER — Other Ambulatory Visit: Payer: Self-pay | Admitting: Family Medicine

## 2019-02-07 ENCOUNTER — Other Ambulatory Visit: Payer: Self-pay | Admitting: Family Medicine

## 2019-02-07 DIAGNOSIS — I10 Essential (primary) hypertension: Secondary | ICD-10-CM

## 2019-02-12 ENCOUNTER — Encounter: Payer: Self-pay | Admitting: Family Medicine

## 2019-02-12 ENCOUNTER — Ambulatory Visit (INDEPENDENT_AMBULATORY_CARE_PROVIDER_SITE_OTHER): Payer: 59 | Admitting: Family Medicine

## 2019-02-12 VITALS — BP 130/88 | HR 90 | Wt 206.0 lb

## 2019-02-12 DIAGNOSIS — E1129 Type 2 diabetes mellitus with other diabetic kidney complication: Secondary | ICD-10-CM | POA: Insufficient documentation

## 2019-02-12 DIAGNOSIS — R55 Syncope and collapse: Secondary | ICD-10-CM | POA: Insufficient documentation

## 2019-02-12 DIAGNOSIS — E119 Type 2 diabetes mellitus without complications: Secondary | ICD-10-CM | POA: Diagnosis not present

## 2019-02-12 DIAGNOSIS — R809 Proteinuria, unspecified: Secondary | ICD-10-CM

## 2019-02-12 DIAGNOSIS — I10 Essential (primary) hypertension: Secondary | ICD-10-CM | POA: Diagnosis not present

## 2019-02-12 LAB — POCT UA - MICROALBUMIN
Creatinine, POC: 300 mg/dL
Microalbumin Ur, POC: 80 mg/L

## 2019-02-12 LAB — POCT GLYCOSYLATED HEMOGLOBIN (HGB A1C): Hemoglobin A1C: 5.6 % (ref 4.0–5.6)

## 2019-02-12 MED ORDER — JARDIANCE 10 MG PO TABS: 10.0000 mg | ORAL_TABLET | Freq: Every day | ORAL | 1 refills | Status: DC

## 2019-02-12 MED ORDER — ATORVASTATIN CALCIUM 40 MG PO TABS: 40.0000 mg | ORAL_TABLET | Freq: Every day | ORAL | 3 refills | Status: DC

## 2019-02-12 NOTE — Patient Instructions (Signed)
You have lost 13 lbs since last here.

## 2019-02-12 NOTE — Progress Notes (Signed)
Established Patient Office Visit  Subjective:  Patient ID: Megan [name], female    DOB: [birth year]  Age: 48 y.o. MRN: [id number]  CC:  Chief Complaint  Patient presents with  . Diabetes  . Hypertension    HPI Kiran L Jenkin presents for  Hypertension- Pt denies chest pain, SOB, dizziness, or heart palpitations.  Taking meds as directed w/o problems.  Denies medication side effects.   Diabetes - no hypoglycemic events. No wounds or sores that are not healing well. No increased thirst or urination. Checking glucose at home. Taking medications as prescribed without any side effects.  She is due for foot exam and urine microalbumin today.  She has lost 13 lb since she was last here.  She has been working on cutting back on portion control.  She is been walking her new puppy more consistently.  She is concerned because she has been having episodes of feeling like she is about to pass out.  She says a couple times it happened while walking.  Once was while she was holding onto the cart at the grocery store she felt like she was always going to pass out.  She was with a friend and said that when she tried to speak to say something that she was not feeling well she felt like she could not speak.  She says she was and was able to mentally reason herself back out of it until she felt better.  Another time was happening when she was walking across her house to get paper towels.  And the last time it happened was actually yesterday while she was sitting at her desk.  Again she felt like she was sort of able to talk herself out of the feeling that she was getting.  She denies feeling overly stressed or anxious.  In fact her current job is been much less stressful than her previous job and her son is now started college which is been a really good thing.  She has not necessarily associated the events with eating or not eating but has been trying to keep an eye on that and a couple times try to eat right after  the episode just to make sure that that was not part of the issue.  She has not had an opportunity to actually check her glucose.  She denies any chest pain, shortness of breath or palpitations.  Her father was diagnosed with A. fib around age 52.  Wants flu vaccine.   Past Medical History:  Diagnosis Date  . Gestational diabetes mellitus in childbirth     Past Surgical History:  Procedure Laterality Date  . TONSILLECTOMY AND ADENOIDECTOMY    . TYMPANOSTOMY TUBE PLACEMENT  1984    Family History  Problem Relation Age of Onset  . Hypertension Mother   . Hypertension Father   . Hypertension Sister   . Hypertension Brother   . Peripheral vascular disease Maternal Grandmother   . Diabetes Paternal Aunt   . Diabetes Maternal Uncle   . Diabetes Paternal Aunt     Social History   Socioeconomic History  . Marital status: Single    Spouse name: Jeweliana Dudgeon  . Number of children: Not on file  . Years of education: Not on file  . Highest education level: Not on file  Occupational History  . Occupation: Asst Public relations account executive    Comment: [employer]   Social Needs  . Financial resource strain: Not on file  . Food insecurity  Worry: Not on file    Inability: Not on file  . Transportation needs    Medical: Not on file    Non-medical: Not on file  Tobacco Use  . Smoking status: Never Smoker  . Smokeless tobacco: Never Used  Substance and Sexual Activity  . Alcohol use: No  . Drug use: No  . Sexual activity: Yes    Partners: Male  Lifestyle  . Physical activity    Days per week: Not on file    Minutes per session: Not on file  . Stress: Not on file  Relationships  . Social Herbalist on phone: Not on file    Gets together: Not on file    Attends religious service: Not on file    Active member of club or organization: Not on file    Attends meetings of clubs or organizations: Not on file    Relationship status: Not on file  . Intimate partner  violence    Fear of current or ex partner: Not on file    Emotionally abused: Not on file    Physically abused: Not on file    Forced sexual activity: Not on file  Other Topics Concern  . Not on file  Social History Narrative   2 caffeine drinks per day. No regular exercise. She works full-time and is going to school.    Outpatient Medications Prior to Visit  Medication Sig Dispense Refill  . AMBULATORY NON FORMULARY MEDICATION One touch Delica 07P lancets   Use to test blood sugar once daily  Schedule a f/u appt 25 each 0  . AMBULATORY NON FORMULARY MEDICATION One touch ultra 2 glucose system  Use to test once daily as directed  Please schedule f/u appt 1 each 0  . diclofenac sodium (VOLTAREN) 1 % GEL Apply 2 g topically 4 (four) times daily. To affected joint. 100 g 11  . hydrochlorothiazide (HYDRODIURIL) 12.5 MG tablet Take 1 tablet by mouth once daily 90 tablet 0  . losartan (COZAAR) 50 MG tablet Take 1 tablet by mouth once daily 90 tablet 0  . metoprolol succinate (TOPROL-XL) 50 MG 24 hr tablet Take 1 tablet (50 mg total) by mouth daily. Take with or immediately following a meal. 90 tablet 1  . norgestrel-ethinyl estradiol (LO/OVRAL,CRYSELLE) 0.3-30 MG-MCG tablet Take 1 tablet by mouth daily.    . Semaglutide, 1 MG/DOSE, (OZEMPIC, 1 MG/DOSE,) 2 MG/1.5ML SOPN Inject 1 mg into the skin once a week. 4 pen 3  . atorvastatin (LIPITOR) 40 MG tablet Take 1 tablet (40 mg total) by mouth daily. 90 tablet 3  . JARDIANCE 10 MG TABS tablet Take 1 tablet by mouth once daily 30 tablet 0   No facility-administered medications prior to visit.     Allergies  Allergen Reactions  . Metformin And Related Other (See Comments)    GI upset   . Sertraline Nausea Only    sweats    ROS Review of Systems    Objective:    Physical Exam  Constitutional: She is oriented to person, place, and time. She appears well-developed and well-nourished.  HENT:  Head: Normocephalic and atraumatic.   Cardiovascular: Normal rate, regular rhythm and normal heart sounds.  No carotid bruits.  Pulmonary/Chest: Effort normal and breath sounds normal.  Neurological: She is alert and oriented to person, place, and time.  Skin: Skin is warm and dry.  Psychiatric: She has a normal mood and affect. Her behavior is normal.  BP 130/88   Pulse 90   Wt 206 lb (93.4 kg)   BMI 35.36 kg/m  Wt Readings from Last 3 Encounters:  02/12/19 206 lb (93.4 kg)  08/30/18 219 lb (99.3 kg)  08/27/18 218 lb (98.9 kg)     Health Maintenance Due  Topic Date Due  . FOOT EXAM  07/06/2018  . HEMOGLOBIN A1C  10/27/2018    There are no preventive care reminders to display for this patient.  Lab Results  Component Value Date   TSH 1.528 02/06/2014   Lab Results  Component Value Date   WBC 9.8 02/06/2014   HGB 14.3 02/06/2014   HCT 41.3 02/06/2014   MCV 86.2 02/06/2014   PLT 306 02/06/2014   Lab Results  Component Value Date   NA 143 01/04/2018   K 3.6 01/04/2018   CO2 23 01/04/2018   GLUCOSE 119 (H) 01/04/2018   BUN 14 01/04/2018   CREATININE 0.89 01/04/2018   BILITOT 0.7 07/06/2017   ALKPHOS 79 06/25/2015   AST 17 07/06/2017   ALT 22 07/06/2017   PROT 6.2 07/06/2017   ALBUMIN 3.9 06/25/2015   CALCIUM 9.2 01/04/2018   Lab Results  Component Value Date   CHOL 138 07/06/2017   Lab Results  Component Value Date   HDL 38 (L) 07/06/2017   Lab Results  Component Value Date   LDLCALC 80 07/06/2017   Lab Results  Component Value Date   TRIG 103 07/06/2017   Lab Results  Component Value Date   CHOLHDL 3.6 07/06/2017   Lab Results  Component Value Date   HGBA1C 5.6 02/12/2019      Assessment & Plan:   Problem List Items Addressed This Visit      Cardiovascular and Mediastinum   Near syncope    It does not sound like it is orthostatic is not occurring with change in position per se.  For example in the grocery store she had already been up and walking for quite some time  before it happened.  And yesterday happened while sitting at her desk.  Discussed could be a cardiac etiology even though she is not experiencing any palpitations or chest pain.  But would recommend cardiac monitor and possibly echocardiogram.  We will also get some up-to-date blood work just to rule out electrolyte abnormality, anemia, thyroid disorder.  Also consider hypoglycemia so if she is at home with this happens again encouraged her to check her glucose level.  Though she says that the sensation feels different than when her blood sugar is dropping.  Usually she will feel shaky when that happens.      Relevant Medications   atorvastatin (LIPITOR) 40 MG tablet   Other Relevant Orders   CBC   COMPLETE METABOLIC PANEL WITH GFR   Lipid Profile   TSH   Essential hypertension, benign - Primary    Well controlled. Continue current regimen. Follow up in  4-6 mo      Relevant Medications   atorvastatin (LIPITOR) 40 MG tablet   Other Relevant Orders   CBC   COMPLETE METABOLIC PANEL WITH GFR   Lipid Profile   TSH     Endocrine   Microalbuminuria due to type 2 diabetes mellitus ([facility])    Already on an ARB.       Relevant Medications   empagliflozin (JARDIANCE) 10 MG TABS tablet   atorvastatin (LIPITOR) 40 MG tablet   Diabetes type 2, controlled (HCC)    A1c looks phenomenal.  She has done great and has lost weight on the Ozempic.  Her A1c is phenomenal at 5.6.  Continue current regimen.  Follow-up in 4 to 6 months.      Relevant Medications   empagliflozin (JARDIANCE) 10 MG TABS tablet   atorvastatin (LIPITOR) 40 MG tablet   Other Relevant Orders   POCT HgB A1C (Completed)   POCT UA - Microalbumin (Completed)   CBC   COMPLETE METABOLIC PANEL WITH GFR   Lipid Profile   TSH      Meds ordered this encounter  Medications  . empagliflozin (JARDIANCE) 10 MG TABS tablet    Sig: Take 10 mg by mouth daily.    Dispense:  90 tablet    Refill:  1  . atorvastatin (LIPITOR) 40 MG  tablet    Sig: Take 1 tablet (40 mg total) by mouth daily.    Dispense:  90 tablet    Refill:  3    Follow-up: Return in about 3 weeks (around 03/05/2019) for near syncope.    Beatrice Lecher, MD

## 2019-02-12 NOTE — Assessment & Plan Note (Signed)
A1c looks phenomenal.  She has done great and has lost weight on the Ozempic.  Her A1c is phenomenal at 5.6.  Continue current regimen.  Follow-up in 4 to 6 months.

## 2019-02-12 NOTE — Assessment & Plan Note (Signed)
Already on an ARB.

## 2019-02-12 NOTE — Assessment & Plan Note (Signed)
Well controlled. Continue current regimen. Follow up in  4-6 mo

## 2019-02-12 NOTE — Assessment & Plan Note (Signed)
It does not sound like it is orthostatic is not occurring with change in position per se.  For example in the grocery store she had already been up and walking for quite some time before it happened.  And yesterday happened while sitting at her desk.  Discussed could be a cardiac etiology even though she is not experiencing any palpitations or chest pain.  But would recommend cardiac monitor and possibly echocardiogram.  We will also get some up-to-date blood work just to rule out electrolyte abnormality, anemia, thyroid disorder.  Also consider hypoglycemia so if she is at home with this happens again encouraged her to check her glucose level.  Though she says that the sensation feels different than when her blood sugar is dropping.  Usually she will feel shaky when that happens.

## 2019-02-13 LAB — COMPLETE METABOLIC PANEL WITH GFR
AG Ratio: 1.6 (calc) (ref 1.0–2.5)
ALT: 16 U/L (ref 6–29)
AST: 15 U/L (ref 10–35)
Albumin: 3.6 g/dL (ref 3.6–5.1)
Alkaline phosphatase (APISO): 78 U/L (ref 31–125)
BUN: 12 mg/dL (ref 7–25)
CO2: 26 mmol/L (ref 20–32)
Calcium: 8.6 mg/dL (ref 8.6–10.2)
Chloride: 107 mmol/L (ref 98–110)
Creat: 0.91 mg/dL (ref 0.50–1.10)
GFR, Est African American: 88 mL/min/{1.73_m2} (ref >=60)
GFR, Est Non African American: 76 mL/min/{1.73_m2} (ref >=60)
Globulin: 2.2 g/dL (calc) (ref 1.9–3.7)
Glucose, Bld: 106 mg/dL — ABNORMAL HIGH (ref 65–99)
Potassium: 3.9 mmol/L (ref 3.5–5.3)
Sodium: 143 mmol/L (ref 135–146)
Total Bilirubin: 0.4 mg/dL (ref 0.2–1.2)
Total Protein: 5.8 g/dL — ABNORMAL LOW (ref 6.1–8.1)

## 2019-02-13 LAB — CBC
HCT: 44.5 % (ref 35.0–45.0)
Hemoglobin: 15 g/dL (ref 11.7–15.5)
MCH: 30.1 pg (ref 27.0–33.0)
MCHC: 33.7 g/dL (ref 32.0–36.0)
MCV: 89.4 fL (ref 80.0–100.0)
MPV: 9.8 fL (ref 7.5–12.5)
Platelets: 315 10*3/uL (ref 140–400)
RBC: 4.98 10*6/uL (ref 3.80–5.10)
RDW: 12.4 % (ref 11.0–15.0)
WBC: 9.3 10*3/uL (ref 3.8–10.8)

## 2019-02-13 LAB — LIPID PANEL
Cholesterol: 189 mg/dL (ref <200)
HDL: 41 mg/dL — ABNORMAL LOW (ref >=50)
LDL Cholesterol (Calc): 128 mg/dL (calc) — ABNORMAL HIGH
Non-HDL Cholesterol (Calc): 148 mg/dL (calc) — ABNORMAL HIGH (ref <130)
Total CHOL/HDL Ratio: 4.6 (calc) (ref <5.0)
Triglycerides: 98 mg/dL (ref <150)

## 2019-02-13 LAB — TSH: TSH: 2.64 mIU/L

## 2019-03-05 ENCOUNTER — Other Ambulatory Visit: Payer: Self-pay

## 2019-03-05 ENCOUNTER — Ambulatory Visit (INDEPENDENT_AMBULATORY_CARE_PROVIDER_SITE_OTHER): Payer: 59 | Admitting: Family Medicine

## 2019-03-05 ENCOUNTER — Encounter: Payer: Self-pay | Admitting: Family Medicine

## 2019-03-05 VITALS — BP 122/57 | HR 86 | Ht 64.0 in | Wt 206.0 lb

## 2019-03-05 DIAGNOSIS — R55 Syncope and collapse: Secondary | ICD-10-CM | POA: Diagnosis not present

## 2019-03-05 DIAGNOSIS — E778 Other disorders of glycoprotein metabolism: Secondary | ICD-10-CM | POA: Diagnosis not present

## 2019-03-05 NOTE — Progress Notes (Signed)
Established Patient Office Visit  Subjective:  Patient ID: Megan [name], female    DOB: May 04, 1971  Age: 48 y.o. MRN: [id number]  CC:  Chief Complaint  Patient presents with  . Follow-up    she reports that she has had 2 episodes of syncope since she was last seen. after the 1st her BS was 143 and the 2nd it was 168. this has been going on x3-4 mos denies any palpitations/sob. she states that it feels like everything is closing in when it begins to happen and it happens to her when she is standing and bending    HPI Megan [name] presents for 3 week f/u for near syncope.  Please see previous note.  She was having episodes of feeling like she was going to pass out with even just sitting at her desk or with walking.  She was not having any chest pain or shortness of breath or palpitations.  She had lost 13 pounds purposefully recently.  Her blood pressure was great while she was here.  We discussed doing some lab work and then additional work-up.  Her labs overall look great except her protein levels were little bit low.  Past Medical History:  Diagnosis Date  . Gestational diabetes mellitus in childbirth     Past Surgical History:  Procedure Laterality Date  . TONSILLECTOMY AND ADENOIDECTOMY    . TYMPANOSTOMY TUBE PLACEMENT  1984    Family History  Problem Relation Age of Onset  . Hypertension Mother   . Hypertension Father   . Hypertension Sister   . Hypertension Brother   . Peripheral vascular disease Maternal Grandmother   . Diabetes Paternal Aunt   . Diabetes Maternal Uncle   . Diabetes Paternal Aunt     Social History   Socioeconomic History  . Marital status: Single    Spouse name: Chemere Steffler  . Number of children: Not on file  . Years of education: Not on file  . Highest education level: Not on file  Occupational History  . Occupation: Asst Public relations account executive    Comment: [employer]   Social Needs  . Financial resource strain: Not on file  . Food insecurity     Worry: Not on file    Inability: Not on file  . Transportation needs    Medical: Not on file    Non-medical: Not on file  Tobacco Use  . Smoking status: Never Smoker  . Smokeless tobacco: Never Used  Substance and Sexual Activity  . Alcohol use: No  . Drug use: No  . Sexual activity: Yes    Partners: Male  Lifestyle  . Physical activity    Days per week: Not on file    Minutes per session: Not on file  . Stress: Not on file  Relationships  . Social Herbalist on phone: Not on file    Gets together: Not on file    Attends religious service: Not on file    Active member of club or organization: Not on file    Attends meetings of clubs or organizations: Not on file    Relationship status: Not on file  . Intimate partner violence    Fear of current or ex partner: Not on file    Emotionally abused: Not on file    Physically abused: Not on file    Forced sexual activity: Not on file  Other Topics Concern  . Not on file  Social History Narrative  2 caffeine drinks per day. No regular exercise. She works full-time and is going to school.    Outpatient Medications Prior to Visit  Medication Sig Dispense Refill  . AMBULATORY NON FORMULARY MEDICATION One touch Delica 11X lancets   Use to test blood sugar once daily  Schedule a f/u appt 25 each 0  . AMBULATORY NON FORMULARY MEDICATION One touch ultra 2 glucose system  Use to test once daily as directed  Please schedule f/u appt 1 each 0  . atorvastatin (LIPITOR) 40 MG tablet Take 1 tablet (40 mg total) by mouth daily. 90 tablet 3  . diclofenac sodium (VOLTAREN) 1 % GEL Apply 2 g topically 4 (four) times daily. To affected joint. 100 g 11  . empagliflozin (JARDIANCE) 10 MG TABS tablet Take 10 mg by mouth daily. 90 tablet 1  . hydrochlorothiazide (HYDRODIURIL) 12.5 MG tablet Take 1 tablet by mouth once daily 90 tablet 0  . losartan (COZAAR) 50 MG tablet Take 1 tablet by mouth once daily 90 tablet 0  . metoprolol  succinate (TOPROL-XL) 50 MG 24 hr tablet Take 1 tablet (50 mg total) by mouth daily. Take with or immediately following a meal. 90 tablet 1  . norgestrel-ethinyl estradiol (LO/OVRAL,CRYSELLE) 0.3-30 MG-MCG tablet Take 1 tablet by mouth daily.    . Semaglutide, 1 MG/DOSE, (OZEMPIC, 1 MG/DOSE,) 2 MG/1.5ML SOPN Inject 1 mg into the skin once a week. 4 pen 3   No facility-administered medications prior to visit.     Allergies  Allergen Reactions  . Metformin And Related Other (See Comments)    GI upset   . Sertraline Nausea Only    sweats    ROS Review of Systems    Objective:    Physical Exam  BP (!) 122/57   Pulse 86   Ht 5' 4"  (1.626 m)   Wt 206 lb (93.4 kg)   SpO2 100%   BMI 35.36 kg/m  Wt Readings from Last 3 Encounters:  03/05/19 206 lb (93.4 kg)  02/12/19 206 lb (93.4 kg)  08/30/18 219 lb (99.3 kg)     There are no preventive care reminders to display for this patient.  There are no preventive care reminders to display for this patient.  Lab Results  Component Value Date   TSH 2.64 02/12/2019   Lab Results  Component Value Date   WBC 9.3 02/12/2019   HGB 15.0 02/12/2019   HCT 44.5 02/12/2019   MCV 89.4 02/12/2019   PLT 315 02/12/2019   Lab Results  Component Value Date   NA 143 02/12/2019   K 3.9 02/12/2019   CO2 26 02/12/2019   GLUCOSE 106 (H) 02/12/2019   BUN 12 02/12/2019   CREATININE 0.91 02/12/2019   BILITOT 0.4 02/12/2019   ALKPHOS 79 06/25/2015   AST 15 02/12/2019   ALT 16 02/12/2019   PROT 5.8 (L) 02/12/2019   ALBUMIN 3.9 06/25/2015   CALCIUM 8.6 02/12/2019   Lab Results  Component Value Date   CHOL 189 02/12/2019   Lab Results  Component Value Date   HDL 41 (L) 02/12/2019   Lab Results  Component Value Date   LDLCALC 128 (H) 02/12/2019   Lab Results  Component Value Date   TRIG 98 02/12/2019   Lab Results  Component Value Date   CHOLHDL 4.6 02/12/2019   Lab Results  Component Value Date   HGBA1C 5.6 02/12/2019       Assessment & Plan:   Problem List Items Addressed This  Visit      Cardiovascular and Mediastinum   Near syncope - Primary   Relevant Orders   ECHOCARDIOGRAM COMPLETE   LONG TERM MONITOR-LIVE TELEMETRY (3-14 DAYS)     Other   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HCC)      Near syncope - discussed moving forward with work up..  Will evaluate for cardiac cause. Will schedule for echo and telemetry.  Continue current regimen.  consider neurologic workup if cardiac workup is negative.   Low serum protein level. Will keep an eye on this. She denies any major dietary changes.    No orders of the defined types were placed in this encounter.   Follow-up: Return in about 4 months (around 07/06/2019), or if symptoms worsen or fail to improve, for Diabetes follow-up.    Beatrice Lecher, MD

## 2019-03-06 DIAGNOSIS — E778 Other disorders of glycoprotein metabolism: Secondary | ICD-10-CM | POA: Insufficient documentation

## 2019-03-13 ENCOUNTER — Ambulatory Visit (HOSPITAL_BASED_OUTPATIENT_CLINIC_OR_DEPARTMENT_OTHER): Payer: 59

## 2019-03-15 ENCOUNTER — Ambulatory Visit: Payer: 59 | Admitting: Family

## 2019-03-19 ENCOUNTER — Ambulatory Visit: Payer: 59 | Admitting: Family

## 2019-03-19 ENCOUNTER — Ambulatory Visit (HOSPITAL_BASED_OUTPATIENT_CLINIC_OR_DEPARTMENT_OTHER): Payer: 59

## 2019-04-20 ENCOUNTER — Other Ambulatory Visit: Payer: Self-pay | Admitting: Family Medicine

## 2019-04-20 ENCOUNTER — Encounter: Payer: Self-pay | Admitting: Family Medicine

## 2019-06-02 IMAGING — MG DIGITAL SCREENING BILATERAL MAMMOGRAM WITH TOMO AND CAD
8 series · 9 of 24 positions shown · non-contrast
Comparison: None.

CLINICAL DATA: Screening.

EXAM:
DIGITAL SCREENING BILATERAL MAMMOGRAM WITH TOMO AND CAD

[R MLO synth-2D]
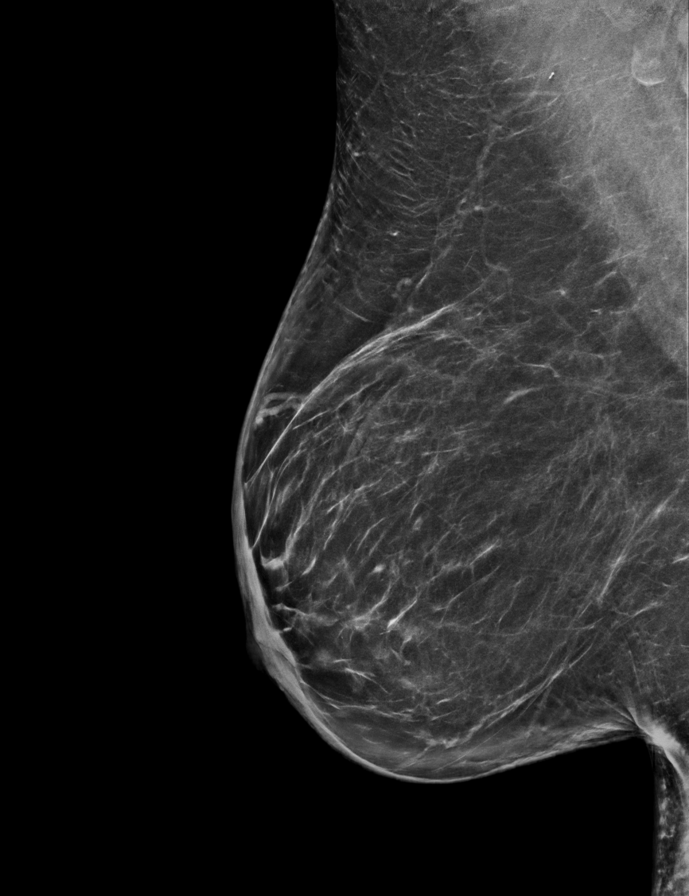

[L MLO synth-2D]
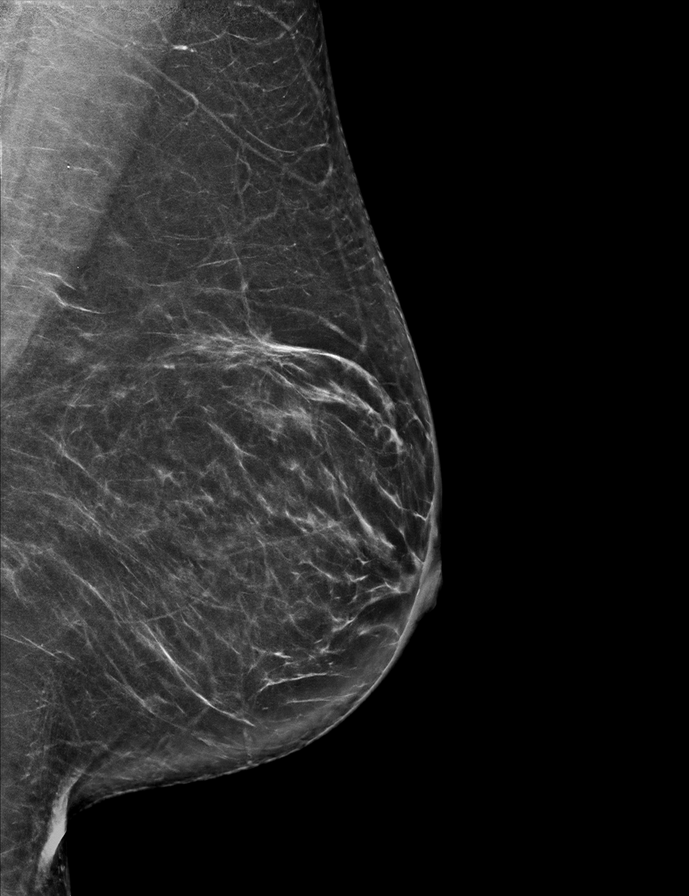

[R CC synth-2D]
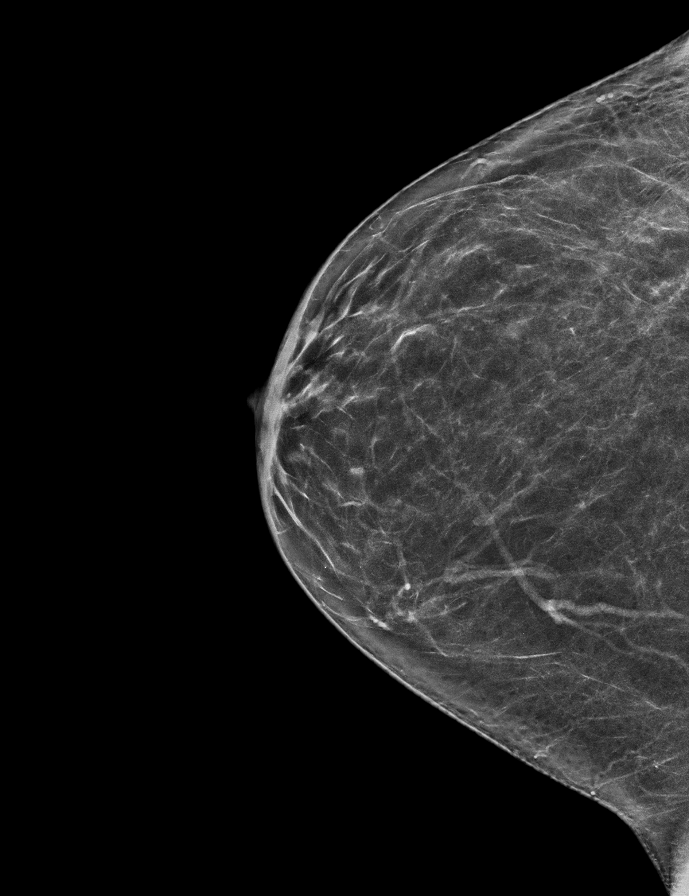

[L CC synth-2D]
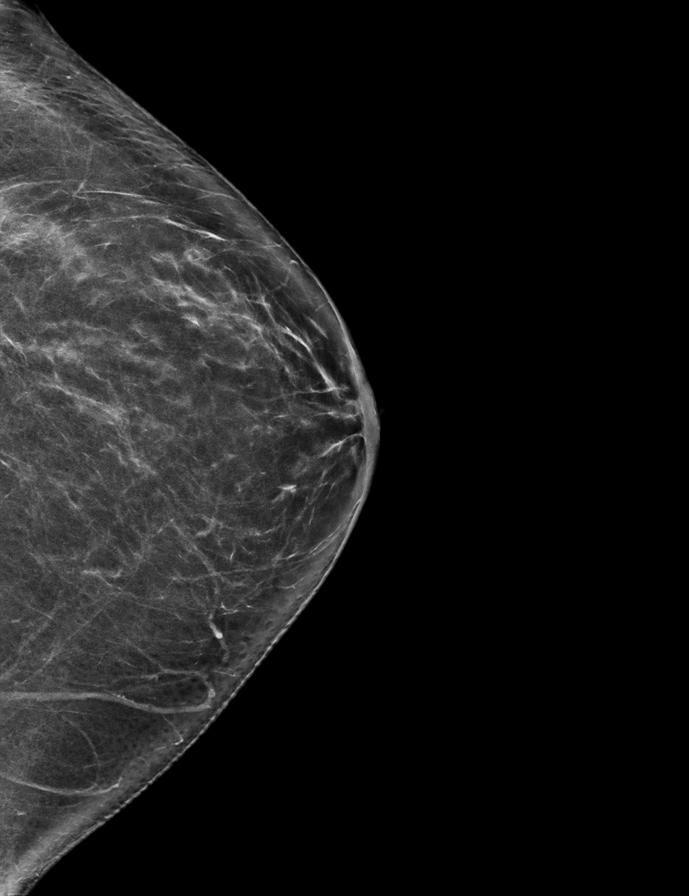

[L MLO tomo · 2 of 75 frames shown]
[frame 25/75]
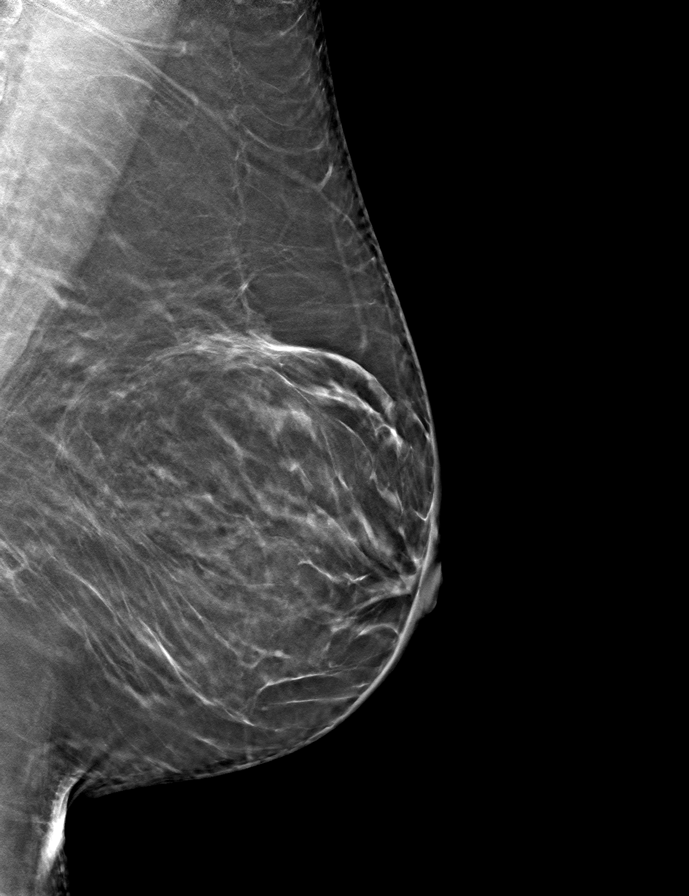
[frame 38/75]
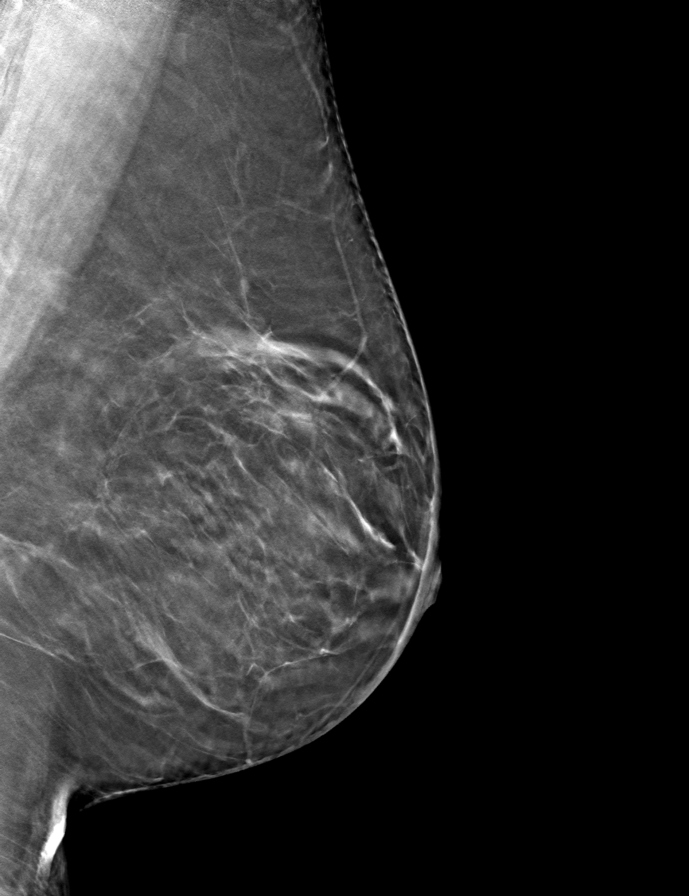

[R CC tomo · tomo slice 33/64.0]
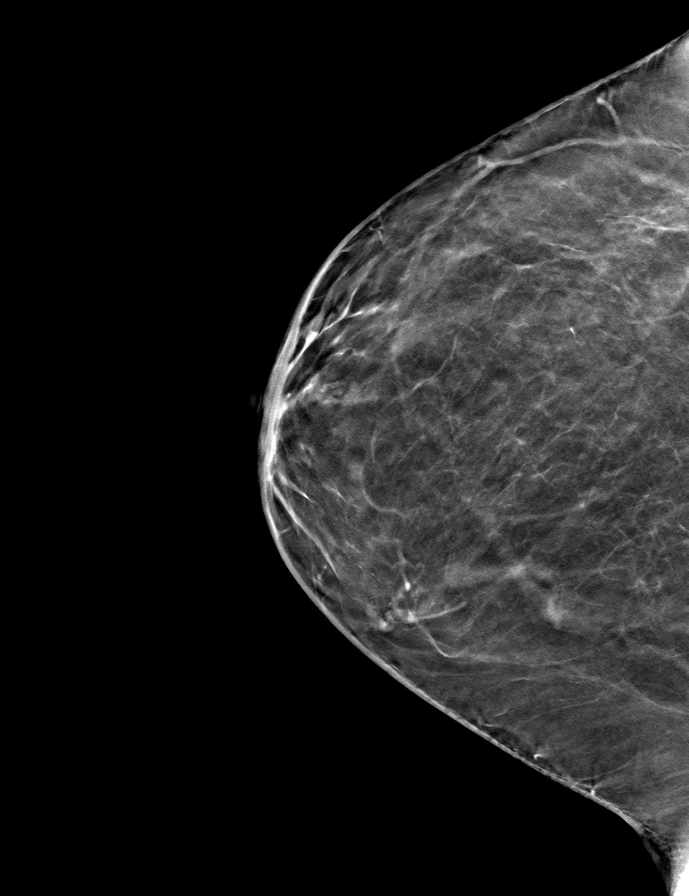

[L CC tomo · tomo slice 36/71.0]
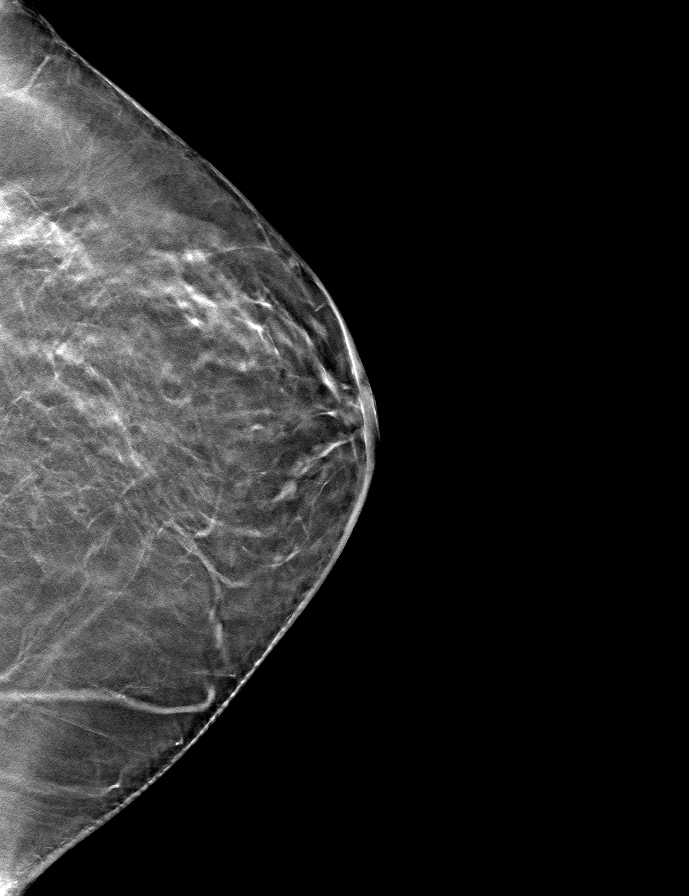

[R MLO tomo · tomo slice 39/76.0]
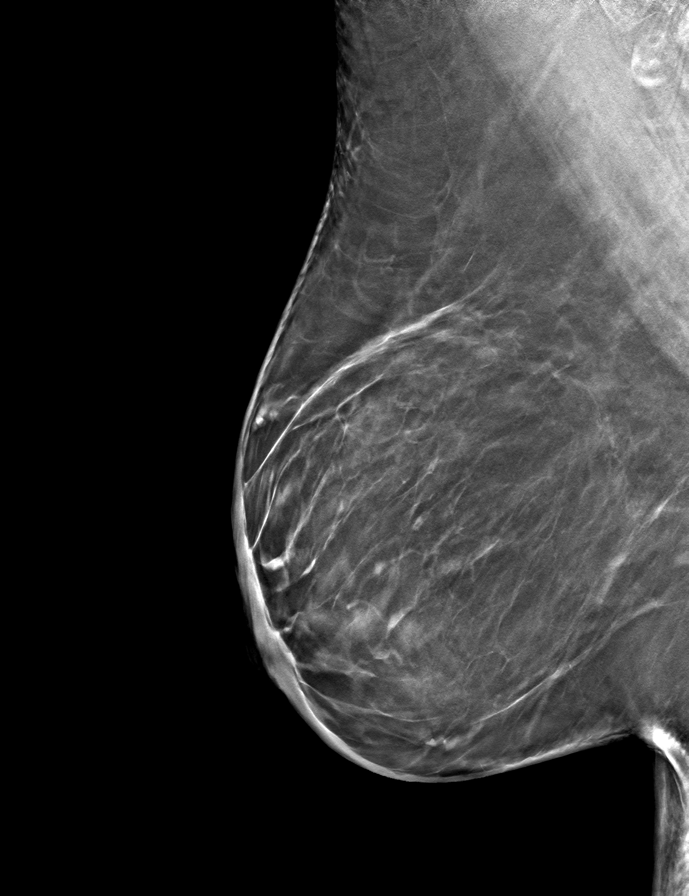

[9 of 24 positions shown; findings below may reference images not displayed]

ACR Breast Density Category b: There are scattered areas of
fibroglandular density.
FINDINGS: There are no findings suspicious for malignancy. Images were
processed with CAD.
IMPRESSION: No mammographic evidence of malignancy. A result letter of this
screening mammogram will be mailed directly to the patient.

RECOMMENDATION:
Screening mammogram in one year. (Code:Y5-G-EJ6)

BI-RADS CATEGORY  1: Negative.

## 2019-06-07 ENCOUNTER — Other Ambulatory Visit: Payer: Self-pay

## 2019-06-07 ENCOUNTER — Encounter: Payer: Self-pay | Admitting: Family Medicine

## 2019-06-07 ENCOUNTER — Ambulatory Visit (INDEPENDENT_AMBULATORY_CARE_PROVIDER_SITE_OTHER): Payer: Managed Care, Other (non HMO) | Admitting: Family Medicine

## 2019-06-07 VITALS — BP 135/82 | HR 87 | Temp 98.0°F | Wt 199.0 lb

## 2019-06-07 DIAGNOSIS — M25562 Pain in left knee: Secondary | ICD-10-CM | POA: Diagnosis not present

## 2019-06-07 DIAGNOSIS — T148XXD Other injury of unspecified body region, subsequent encounter: Secondary | ICD-10-CM | POA: Diagnosis not present

## 2019-06-07 DIAGNOSIS — M79672 Pain in left foot: Secondary | ICD-10-CM | POA: Diagnosis not present

## 2019-06-07 DIAGNOSIS — R230 Cyanosis: Secondary | ICD-10-CM

## 2019-06-07 NOTE — Progress Notes (Signed)
Acute Office Visit  Subjective:    Patient ID: Megan [name], female    DOB: 1971/04/16, 49 y.o.   MRN: [id number]  Chief Complaint  Patient presents with  . Toe Pain    cold feet left foot pain in 3rd and 4th digits. Itches at times, toes are purple    HPI Patient is in today for left foot discomfort and toes turning blue.  She reports that for at least the last 4 to 6 weeks she has had some pain and tenderness in the distal left great toe and second toe.  She did hit her great toe on a dog toy that cut it and caused an injury but says it just does not seem to be healing very well and now it starting to feel sore again.  She actually also hit her second toe which caused an injury on the tip of the toe as well and again just does not seem to be healing very quickly.  She mostly wears bedroom shoes around the house because she works from home.  She says after talking to a friend about the fact that her toes occasionally turn blue they recommended that she come and speak with the physician.  She says that for months and maybe even longer she has noticed that her toes will occasionally change colors and will turn blue.  She says sometimes when it happens they will be a little uncomfortable almost an itchy sensation but not quite.  It is not quite painful.  She says they seem extremely sensitive to cold.  That happens in both feet.  Does not affect her hands or any other extremities.  Also c/o of left knee pain for about 1 month.  She says that her knee bothered her about 3 or 4 years ago and actually saw one of our sports med docs and had an x-ray and an injection and says that she is done really well ever since then.  But over the last month it is been persistently bothering her mostly on the lateral part of the kneecap and in even going behind her knee.  She is noticed that is been a little bit more swollen.  No recent injury or trauma.  She has tried Tylenol and anti-inflammatories which do not seem  to be helping very much.  Past Medical History:  Diagnosis Date  . Gestational diabetes mellitus in childbirth     Past Surgical History:  Procedure Laterality Date  . TONSILLECTOMY AND ADENOIDECTOMY    . TYMPANOSTOMY TUBE PLACEMENT  1984    Family History  Problem Relation Age of Onset  . Hypertension Mother   . Hypertension Father   . Hypertension Sister   . Hypertension Brother   . Peripheral vascular disease Maternal Grandmother   . Diabetes Paternal Aunt   . Diabetes Maternal Uncle   . Diabetes Paternal Aunt     Social History   Socioeconomic History  . Marital status: Single    Spouse name: Maitri Schnoebelen  . Number of children: Not on file  . Years of education: Not on file  . Highest education level: Not on file  Occupational History  . Occupation: Asst Public relations account executive    Comment: [employer]   Tobacco Use  . Smoking status: Never Smoker  . Smokeless tobacco: Never Used  Substance and Sexual Activity  . Alcohol use: No  . Drug use: No  . Sexual activity: Yes    Partners: Male  Other Topics  Concern  . Not on file  Social History Narrative   2 caffeine drinks per day. No regular exercise. She works full-time and is going to school.   Social Determinants of Health   Financial Resource Strain:   . Difficulty of Paying Living Expenses: Not on file  Food Insecurity:   . Worried About Charity fundraiser in the Last Year: Not on file  . Ran Out of Food in the Last Year: Not on file  Transportation Needs:   . Lack of Transportation (Medical): Not on file  . Lack of Transportation (Non-Medical): Not on file  Physical Activity:   . Days of Exercise per Week: Not on file  . Minutes of Exercise per Session: Not on file  Stress:   . Feeling of Stress : Not on file  Social Connections:   . Frequency of Communication with Friends and Family: Not on file  . Frequency of Social Gatherings with Friends and Family: Not on file  . Attends Religious Services: Not  on file  . Active Member of Clubs or Organizations: Not on file  . Attends Archivist Meetings: Not on file  . Marital Status: Not on file  Intimate Partner Violence:   . Fear of Current or Ex-Partner: Not on file  . Emotionally Abused: Not on file  . Physically Abused: Not on file  . Sexually Abused: Not on file    Outpatient Medications Prior to Visit  Medication Sig Dispense Refill  . AMBULATORY NON FORMULARY MEDICATION One touch ultra 2 glucose system  Use to test once daily as directed  Please schedule f/u appt 1 each 0  . atorvastatin (LIPITOR) 40 MG tablet Take 1 tablet (40 mg total) by mouth daily. 90 tablet 3  . diclofenac sodium (VOLTAREN) 1 % GEL Apply 2 g topically 4 (four) times daily. To affected joint. 100 g 11  . empagliflozin (JARDIANCE) 10 MG TABS tablet Take 10 mg by mouth daily. 90 tablet 1  . hydrochlorothiazide (HYDRODIURIL) 12.5 MG tablet Take 1 tablet by mouth once daily 90 tablet 0  . losartan (COZAAR) 50 MG tablet Take 1 tablet by mouth once daily 90 tablet 0  . metoprolol succinate (TOPROL-XL) 50 MG 24 hr tablet Take 1 tablet (50 mg total) by mouth daily. Take with or immediately following a meal. 90 tablet 1  . norgestrel-ethinyl estradiol (LO/OVRAL,CRYSELLE) 0.3-30 MG-MCG tablet Take 1 tablet by mouth daily.    Marland Kitchen OZEMPIC, 1 MG/DOSE, 2 MG/1.5ML SOPN INJECT 1 MG  SUBCUTANEOUSLY ONCE A WEEK 8 mL 3  . AMBULATORY NON FORMULARY MEDICATION One touch Delica 27P lancets   Use to test blood sugar once daily  Schedule a f/u appt 25 each 0   No facility-administered medications prior to visit.    Allergies  Allergen Reactions  . Metformin And Related Other (See Comments)    GI upset   . Sertraline Nausea Only    sweats    Review of Systems     Objective:    Physical Exam Vitals reviewed.  Constitutional:      Appearance: [name].  HENT:     Head: Normocephalic and atraumatic.  Eyes:     Conjunctiva/sclera: Conjunctivae  normal.  Cardiovascular:     Rate and Rhythm: Normal rate.  Pulmonary:     Effort: Pulmonary effort is normal.  Musculoskeletal:     Comments: Left foot with 2+ dorsal pedal pulse and posterior tibial pulse.  She does have a scratch  across top of her foot which she says is been there for over a month.  On the tip of her great toe and second toe she has some erythema and a small scab at the tip of the great toe.  It is tender to touch but no streaking erythema or induration.  Her toes are distinctly dark red in her great toe is a most a purple color.  The difference between the rest of her foot and her toes is well demarcated.  She has delayed capillary refill of about 5 to 6 seconds in her toes.  Skin:    General: Skin is dry.     Coloration: Skin is not pale.  Neurological:     Mental Status: She is alert and oriented to person, place, and time.  Psychiatric:        Behavior: Behavior normal.     BP 135/82   Pulse 87   Temp 98 F (36.7 C) (Oral)   Wt 199 lb (90.3 kg)   SpO2 100%   BMI 34.16 kg/m  Wt Readings from Last 3 Encounters:  06/07/19 199 lb (90.3 kg)  03/05/19 206 lb (93.4 kg)  02/12/19 206 lb (93.4 kg)    There are no preventive care reminders to display for this patient.  There are no preventive care reminders to display for this patient.   Lab Results  Component Value Date   TSH 2.64 02/12/2019   Lab Results  Component Value Date   WBC 9.3 02/12/2019   HGB 15.0 02/12/2019   HCT 44.5 02/12/2019   MCV 89.4 02/12/2019   PLT 315 02/12/2019   Lab Results  Component Value Date   NA 143 02/12/2019   K 3.9 02/12/2019   CO2 26 02/12/2019   GLUCOSE 106 (H) 02/12/2019   BUN 12 02/12/2019   CREATININE 0.91 02/12/2019   BILITOT 0.4 02/12/2019   ALKPHOS 79 06/25/2015   AST 15 02/12/2019   ALT 16 02/12/2019   PROT 5.8 (L) 02/12/2019   ALBUMIN 3.9 06/25/2015   CALCIUM 8.6 02/12/2019   Lab Results  Component Value Date   CHOL 189 02/12/2019   Lab Results   Component Value Date   HDL 41 (L) 02/12/2019   Lab Results  Component Value Date   LDLCALC 128 (H) 02/12/2019   Lab Results  Component Value Date   TRIG 98 02/12/2019   Lab Results  Component Value Date   CHOLHDL 4.6 02/12/2019   Lab Results  Component Value Date   HGBA1C 5.6 02/12/2019       Assessment & Plan:   Problem List Items Addressed This Visit      Other   Left knee pain    Other Visit Diagnoses    Left foot pain    -  Primary   Relevant Orders   VAS [place] ABI WITH/WO TBI   Blue toes       Relevant Orders   CBC   Sedimentation rate   C-reactive protein   ANA   COMPLETE METABOLIC PANEL WITH GFR   Urinalysis, Routine w reflex microscopic   TSH   VAS [place] ABI WITH/WO TBI   Wound healing, delayed       Relevant Orders   CBC   Sedimentation rate   C-reactive protein   ANA   COMPLETE METABOLIC PANEL WITH GFR   Urinalysis, Routine w reflex microscopic   TSH   VAS [place] ABI WITH/WO TBI     Left knee pain-previous  x-ray did not show any significant osteoarthritis.  Suspect that she may just have some inflammation in the joint.  Recommend getting in with one of our sports med docs for possible injection sometime in the next couple of weeks.  I do not think she needs an extra rate again at this point without any new injury or trauma.  She does have some swelling on exam..  Okay to use Tylenol or anti-inflammatory as well as ice.  Elevate and rest.  Blue toes-suspect Raynaud's disease.  Discussed that is typically a diagnosis of exclusion and based on her description and the discoloration of her toes on exams today it is most consistent.  We will do some additional labs just to rule out other potential causes.  Leg wound healing-she does seem to have some delayed wound healing and so we discussed strategies around that.  We will get her set up for ABIs with toe pressure just to make sure that she is good to good arterial flow and again also doing additional work-up to  rule out other autoimmune problems.   No orders of the defined types were placed in this encounter.    Beatrice Lecher, MD

## 2019-06-10 ENCOUNTER — Ambulatory Visit: Payer: 59 | Admitting: Physician Assistant

## 2019-06-10 LAB — COMPLETE METABOLIC PANEL WITH GFR
AG Ratio: 1.7 (calc) (ref 1.0–2.5)
ALT: 18 U/L (ref 6–29)
AST: 15 U/L (ref 10–35)
Albumin: 4 g/dL (ref 3.6–5.1)
Alkaline phosphatase (APISO): 90 U/L (ref 31–125)
BUN: 12 mg/dL (ref 7–25)
CO2: 29 mmol/L (ref 20–32)
Calcium: 9.8 mg/dL (ref 8.6–10.2)
Chloride: 103 mmol/L (ref 98–110)
Creat: 0.85 mg/dL (ref 0.50–1.10)
GFR, Est African American: 95 mL/min/{1.73_m2} (ref >=60)
GFR, Est Non African American: 82 mL/min/{1.73_m2} (ref >=60)
Globulin: 2.4 g/dL (calc) (ref 1.9–3.7)
Glucose, Bld: 114 mg/dL (ref 65–139)
Potassium: 3.5 mmol/L (ref 3.5–5.3)
Sodium: 142 mmol/L (ref 135–146)
Total Bilirubin: 0.5 mg/dL (ref 0.2–1.2)
Total Protein: 6.4 g/dL (ref 6.1–8.1)

## 2019-06-10 LAB — CBC
HCT: 44.5 % (ref 35.0–45.0)
Hemoglobin: 15.1 g/dL (ref 11.7–15.5)
MCH: 29.7 pg (ref 27.0–33.0)
MCHC: 33.9 g/dL (ref 32.0–36.0)
MCV: 87.6 fL (ref 80.0–100.0)
MPV: 9.5 fL (ref 7.5–12.5)
Platelets: 335 10*3/uL (ref 140–400)
RBC: 5.08 10*6/uL (ref 3.80–5.10)
RDW: 12.3 % (ref 11.0–15.0)
WBC: 9.1 10*3/uL (ref 3.8–10.8)

## 2019-06-10 LAB — URINALYSIS, ROUTINE W REFLEX MICROSCOPIC
Bilirubin Urine: NEGATIVE
Hgb urine dipstick: NEGATIVE
Ketones, ur: NEGATIVE
Leukocytes,Ua: NEGATIVE
Nitrite: NEGATIVE
Protein, ur: NEGATIVE
Specific Gravity, Urine: 1.044 — ABNORMAL HIGH (ref 1.001–1.03)
pH: <=5 (ref 5.0–8.0)

## 2019-06-10 LAB — C-REACTIVE PROTEIN: CRP: 29.4 mg/L — ABNORMAL HIGH (ref <8.0)

## 2019-06-10 LAB — TSH: TSH: 2 mIU/L

## 2019-06-10 LAB — ANA: Anti Nuclear Antibody (ANA): NEGATIVE

## 2019-06-10 LAB — SEDIMENTATION RATE: Sed Rate: 28 mm/h — ABNORMAL HIGH (ref 0–20)

## 2019-06-11 ENCOUNTER — Ambulatory Visit (HOSPITAL_COMMUNITY)
Admission: RE | Admit: 2019-06-11 | Discharge: 2019-06-11 | Disposition: A | Discharge status: Home / Self Care | Payer: Managed Care, Other (non HMO) | Source: Ambulatory Visit | Attending: Family Medicine | Admitting: Family Medicine

## 2019-06-11 ENCOUNTER — Other Ambulatory Visit: Payer: Self-pay

## 2019-06-11 DIAGNOSIS — M79672 Pain in left foot: Secondary | ICD-10-CM | POA: Diagnosis not present

## 2019-06-11 DIAGNOSIS — T148XXD Other injury of unspecified body region, subsequent encounter: Secondary | ICD-10-CM | POA: Diagnosis not present

## 2019-06-11 DIAGNOSIS — R230 Cyanosis: Secondary | ICD-10-CM | POA: Insufficient documentation

## 2019-06-11 NOTE — Progress Notes (Signed)
ABI completed. Refer to "CV Proc" under chart review to view preliminary results.  06/11/2019 2:04 PM Kelby Aline., MHA, RVT, RDCS, RDMS

## 2019-06-18 ENCOUNTER — Telehealth: Payer: Self-pay | Admitting: Family Medicine

## 2019-06-18 NOTE — Telephone Encounter (Signed)
Received fax for prior authorization on Ozempic sent through cover my meds waiting on determination. - CF

## 2019-06-18 NOTE — Telephone Encounter (Signed)
Received fax from [employer] and they approved Ozempic Inj.   Case Number: [id number] Valid: 06/18/19 - 06/17/20 - CF

## 2019-06-26 ENCOUNTER — Other Ambulatory Visit: Payer: Self-pay | Admitting: Family Medicine

## 2019-06-26 DIAGNOSIS — I10 Essential (primary) hypertension: Secondary | ICD-10-CM

## 2019-07-08 ENCOUNTER — Ambulatory Visit (INDEPENDENT_AMBULATORY_CARE_PROVIDER_SITE_OTHER): Payer: 59 | Admitting: Family Medicine

## 2019-07-08 ENCOUNTER — Ambulatory Visit (INDEPENDENT_AMBULATORY_CARE_PROVIDER_SITE_OTHER): Payer: 59 | Admitting: Sports Medicine

## 2019-07-08 ENCOUNTER — Ambulatory Visit (INDEPENDENT_AMBULATORY_CARE_PROVIDER_SITE_OTHER): Payer: 59

## 2019-07-08 ENCOUNTER — Other Ambulatory Visit: Payer: Self-pay

## 2019-07-08 ENCOUNTER — Encounter: Payer: Self-pay | Admitting: Sports Medicine

## 2019-07-08 ENCOUNTER — Encounter: Payer: Self-pay | Admitting: Family Medicine

## 2019-07-08 VITALS — BP 133/69 | HR 96 | Ht 64.0 in | Wt 200.0 lb

## 2019-07-08 DIAGNOSIS — I7300 Raynaud's syndrome without gangrene: Secondary | ICD-10-CM | POA: Diagnosis not present

## 2019-07-08 DIAGNOSIS — G8929 Other chronic pain: Secondary | ICD-10-CM

## 2019-07-08 DIAGNOSIS — M25562 Pain in left knee: Secondary | ICD-10-CM

## 2019-07-08 DIAGNOSIS — R809 Proteinuria, unspecified: Secondary | ICD-10-CM

## 2019-07-08 DIAGNOSIS — E119 Type 2 diabetes mellitus without complications: Secondary | ICD-10-CM | POA: Diagnosis not present

## 2019-07-08 DIAGNOSIS — I10 Essential (primary) hypertension: Secondary | ICD-10-CM | POA: Diagnosis not present

## 2019-07-08 DIAGNOSIS — E1129 Type 2 diabetes mellitus with other diabetic kidney complication: Secondary | ICD-10-CM | POA: Diagnosis not present

## 2019-07-08 LAB — POCT GLYCOSYLATED HEMOGLOBIN (HGB A1C): Hemoglobin A1C: 5.8 % — AB (ref 4.0–5.6)

## 2019-07-08 MED ORDER — LOSARTAN POTASSIUM-HCTZ 100-12.5 MG PO TABS: 1.0000 | ORAL_TABLET | Freq: Every day | ORAL | 1 refills | Status: DC

## 2019-07-08 NOTE — Assessment & Plan Note (Signed)
For now will hold off on starting a CaChB. She will  Monitor it over the summer.

## 2019-07-08 NOTE — Assessment & Plan Note (Signed)
A1C is up a little today from previous but o/w at goal.  Continue current regimen.  Follow-up in 4 months.  Just continue to work on activity level.

## 2019-07-08 NOTE — Progress Notes (Signed)
    Procedures performed today:    Procedure: Real-time Ultrasound Guided injection of the left knee Device: Samsung HS60  Verbal informed consent obtained.  Time-out conducted.  Noted no overlying erythema, induration, or other signs of local infection.  Skin prepped in a sterile fashion.  Local anesthesia: Topical Ethyl chloride.  With sterile technique and under real time ultrasound guidance: 1 cc Kenalog 40, 2 cc lidocaine, 2 cc bupivacaine injected easily Completed without difficulty  Pain immediately resolved suggesting accurate placement of the medication.  Advised to call if fevers/chills, erythema, induration, drainage, or persistent bleeding.  Images permanently stored and available for review in the ultrasound unit.  Impression: Technically successful ultrasound guided injection.  Independent interpretation of notes and tests performed by another provider:   None.  Impression and Recommendations:    Left knee pain Shemicka returns, she continues to have some left knee pain, medial joint line with mild swelling. She had an injection approximately 4 years ago that provided good relief. Exam is unremarkable today with the exception of medial joint line pain, we did an injection today, rehab exercises given, I am going to get some updated x-rays and she can return to see me in 1 month for reevaluation.    ___________________________________________ Gwen Her. Dianah Field, M.D., ABFM., CAQSM. Primary Care and [place] Instructor of [place] of [facility]

## 2019-07-08 NOTE — Assessment & Plan Note (Signed)
Blood pressure overall okay but not quite optimal.  We discussed really getting her blood pressure in the 120s.  I am also going to combine her losartan HCTZ back together and I will go up on the losartan just slightly.  Plan to follow-up in 3 months.  Are up-to-date.

## 2019-07-08 NOTE — Assessment & Plan Note (Signed)
Megan [name] returns, she continues to have some left knee pain, medial joint line with mild swelling. She had an injection approximately 4 years ago that provided good relief. Exam is unremarkable today with the exception of medial joint line pain, we did an injection today, rehab exercises given, I am going to get some updated x-rays and she can return to see me in 1 month for reevaluation.

## 2019-07-08 NOTE — Progress Notes (Signed)
Established Patient Office Visit  Subjective:  Patient ID: Megan [name], female    DOB: October 30, 1970  Age: 49 y.o. MRN: [id number]  CC:  Chief Complaint  Patient presents with  . Hypertension    HPI [name] presents for   Hypertension- Pt denies chest pain, SOB, dizziness, or heart palpitations.  Taking meds as directed w/o problems.  Denies medication side effects.   Diabetes - no hypoglycemic events. No wounds or sores that are not healing well. No increased thirst or urination. Checking glucose at home. Taking medications as prescribed without any side effects.  Actually feels like she is doing better since working from home she has not been snacking nearly as much and she has been walking her new dog.  Regards to the blue feet-we discussed previously that she possibly has Raynaud's.  Blood work was unrevealing and she did have ABIs which were also normal.  So I do think her diagnosis is most consistent with Raynaud's..  She says that she is still noticing the discoloration but says that the itching sensation that happens with it actually seems to be a little bit better.  Past Medical History:  Diagnosis Date  . Gestational diabetes mellitus in childbirth     Past Surgical History:  Procedure Laterality Date  . TONSILLECTOMY AND ADENOIDECTOMY    . TYMPANOSTOMY TUBE PLACEMENT  1984    Family History  Problem Relation Age of Onset  . Hypertension Mother   . Hypertension Father   . Hypertension Sister   . Hypertension Brother   . Peripheral vascular disease Maternal Grandmother   . Diabetes Paternal Aunt   . Diabetes Maternal Uncle   . Diabetes Paternal Aunt     Social History   Socioeconomic History  . Marital status: Single    Spouse name: [name]  . Number of children: Not on file  . Years of education: Not on file  . Highest education level: Not on file  Occupational History  . Occupation: Asst Public relations account executive    Comment: [employer]   Tobacco Use   . Smoking status: Never Smoker  . Smokeless tobacco: Never Used  Substance and Sexual Activity  . Alcohol use: No  . Drug use: No  . Sexual activity: Yes    Partners: Male  Other Topics Concern  . Not on file  Social History Narrative   2 caffeine drinks per day. No regular exercise. She works full-time and is going to school.   Social Determinants of Health   Financial Resource Strain:   . Difficulty of Paying Living Expenses: Not on file  Food Insecurity:   . Worried About Charity fundraiser in the Last Year: Not on file  . Ran Out of Food in the Last Year: Not on file  Transportation Needs:   . Lack of Transportation (Medical): Not on file  . Lack of Transportation (Non-Medical): Not on file  Physical Activity:   . Days of Exercise per Week: Not on file  . Minutes of Exercise per Session: Not on file  Stress:   . Feeling of Stress : Not on file  Social Connections:   . Frequency of Communication with Friends and Family: Not on file  . Frequency of Social Gatherings with Friends and Family: Not on file  . Attends Religious Services: Not on file  . Active Member of Clubs or Organizations: Not on file  . Attends Archivist Meetings: Not on file  . Marital  Status: Not on file  Intimate Partner Violence:   . Fear of Current or Ex-Partner: Not on file  . Emotionally Abused: Not on file  . Physically Abused: Not on file  . Sexually Abused: Not on file    Outpatient Medications Prior to Visit  Medication Sig Dispense Refill  . AMBULATORY NON FORMULARY MEDICATION One touch ultra 2 glucose system  Use to test once daily as directed  Please schedule f/u appt 1 each 0  . atorvastatin (LIPITOR) 40 MG tablet Take 1 tablet (40 mg total) by mouth daily. 90 tablet 3  . diclofenac sodium (VOLTAREN) 1 % GEL Apply 2 g topically 4 (four) times daily. To affected joint. 100 g 11  . empagliflozin (JARDIANCE) 10 MG TABS tablet Take 10 mg by mouth daily. 90 tablet 1  .  metoprolol succinate (TOPROL-XL) 50 MG 24 hr tablet Take 1 tablet by mouth once daily 90 tablet 0  . norgestrel-ethinyl estradiol (LO/OVRAL,CRYSELLE) 0.3-30 MG-MCG tablet Take 1 tablet by mouth daily.    Marland Kitchen OZEMPIC, 1 MG/DOSE, 2 MG/1.5ML SOPN INJECT 1 MG  SUBCUTANEOUSLY ONCE A WEEK 8 mL 3  . hydrochlorothiazide (HYDRODIURIL) 12.5 MG tablet Take 1 tablet by mouth once daily 90 tablet 0  . losartan (COZAAR) 50 MG tablet Take 1 tablet by mouth once daily 90 tablet 0   No facility-administered medications prior to visit.    Allergies  Allergen Reactions  . Metformin And Related Other (See Comments)    GI upset   . Sertraline Nausea Only    sweats    ROS Review of Systems    Objective:    Physical Exam  Constitutional: She is oriented to person, place, and time. She appears well-developed and well-nourished.  HENT:  Head: Normocephalic and atraumatic.  Cardiovascular: Normal rate, regular rhythm and normal heart sounds.  Pulmonary/Chest: Effort normal and breath sounds normal.  Neurological: She is alert and oriented to person, place, and time.  Skin: Skin is warm and dry.  Psychiatric: She has a normal mood and affect. Her behavior is normal.    BP 133/69   Pulse 96   Ht 5' 4"  (1.626 m)   Wt 200 lb (90.7 kg)   SpO2 99%   BMI 34.33 kg/m  Wt Readings from Last 3 Encounters:  07/08/19 200 lb (90.7 kg)  06/07/19 199 lb (90.3 kg)  03/05/19 206 lb (93.4 kg)     There are no preventive care reminders to display for this patient.  There are no preventive care reminders to display for this patient.  Lab Results  Component Value Date   TSH 2.00 06/07/2019   Lab Results  Component Value Date   WBC 9.1 06/07/2019   HGB 15.1 06/07/2019   HCT 44.5 06/07/2019   MCV 87.6 06/07/2019   PLT 335 06/07/2019   Lab Results  Component Value Date   NA 142 06/07/2019   K 3.5 06/07/2019   CO2 29 06/07/2019   GLUCOSE 114 06/07/2019   BUN 12 06/07/2019   CREATININE 0.85  06/07/2019   BILITOT 0.5 06/07/2019   ALKPHOS 79 06/25/2015   AST 15 06/07/2019   ALT 18 06/07/2019   PROT 6.4 06/07/2019   ALBUMIN 3.9 06/25/2015   CALCIUM 9.8 06/07/2019   Lab Results  Component Value Date   CHOL 189 02/12/2019   Lab Results  Component Value Date   HDL 41 (L) 02/12/2019   Lab Results  Component Value Date   LDLCALC 128 (H) 02/12/2019  Lab Results  Component Value Date   TRIG 98 02/12/2019   Lab Results  Component Value Date   CHOLHDL 4.6 02/12/2019   Lab Results  Component Value Date   HGBA1C 5.8 (A) 07/08/2019      Assessment & Plan:   Problem List Items Addressed This Visit      Cardiovascular and Mediastinum   Raynaud's disease, idiopathic    For now will hold off on starting a CaChB. She will  Monitor it over the summer.       Relevant Medications   losartan-hydrochlorothiazide (HYZAAR) 100-12.5 MG tablet   Essential hypertension, benign    Blood pressure overall okay but not quite optimal.  We discussed really getting her blood pressure in the 120s.  I am also going to combine her losartan HCTZ back together and I will go up on the losartan just slightly.  Plan to follow-up in 3 months.  Are up-to-date.      Relevant Medications   losartan-hydrochlorothiazide (HYZAAR) 100-12.5 MG tablet     Endocrine   Microalbuminuria due to type 2 diabetes mellitus (HCC)   Relevant Medications   losartan-hydrochlorothiazide (HYZAAR) 100-12.5 MG tablet   Diabetes type 2, controlled ([place]) - Primary    A1C is up a little today from previous but o/w at goal.  Continue current regimen.  Follow-up in 4 months.  Just continue to work on activity level.      Relevant Medications   losartan-hydrochlorothiazide (HYZAAR) 100-12.5 MG tablet   Other Relevant Orders   POCT glycosylated hemoglobin (Hb A1C) (Completed)     reminded her to schedule her mammogram.  Meds ordered this encounter  Medications  . losartan-hydrochlorothiazide (HYZAAR) 100-12.5  MG tablet    Sig: Take 1 tablet by mouth daily.    Dispense:  90 tablet    Refill:  1    Follow-up: Return in about 4 months (around 11/07/2019) for Hypertension, Diabetes follow-up.    Beatrice Lecher, MD

## 2019-09-09 ENCOUNTER — Other Ambulatory Visit: Payer: Self-pay | Admitting: *Deleted

## 2019-09-09 DIAGNOSIS — I10 Essential (primary) hypertension: Secondary | ICD-10-CM

## 2019-09-09 MED ORDER — LOSARTAN POTASSIUM-HCTZ 100-12.5 MG PO TABS: 1.0000 | ORAL_TABLET | Freq: Every day | ORAL | 1 refills | Status: DC

## 2019-09-17 ENCOUNTER — Other Ambulatory Visit: Payer: Self-pay | Admitting: *Deleted

## 2019-09-17 DIAGNOSIS — E119 Type 2 diabetes mellitus without complications: Secondary | ICD-10-CM

## 2019-09-17 MED ORDER — JARDIANCE 10 MG PO TABS: 10.0000 mg | ORAL_TABLET | Freq: Every day | ORAL | 1 refills | Status: DC

## 2019-11-12 ENCOUNTER — Ambulatory Visit (INDEPENDENT_AMBULATORY_CARE_PROVIDER_SITE_OTHER): Payer: 59 | Admitting: Family Medicine

## 2019-11-12 ENCOUNTER — Encounter: Payer: Self-pay | Admitting: Family Medicine

## 2019-11-12 ENCOUNTER — Other Ambulatory Visit: Payer: Self-pay

## 2019-11-12 VITALS — BP 124/63 | HR 83 | Ht 64.0 in | Wt 201.0 lb

## 2019-11-12 DIAGNOSIS — I10 Essential (primary) hypertension: Secondary | ICD-10-CM

## 2019-11-12 DIAGNOSIS — I7300 Raynaud's syndrome without gangrene: Secondary | ICD-10-CM

## 2019-11-12 DIAGNOSIS — Z1211 Encounter for screening for malignant neoplasm of colon: Secondary | ICD-10-CM

## 2019-11-12 DIAGNOSIS — E119 Type 2 diabetes mellitus without complications: Secondary | ICD-10-CM | POA: Diagnosis not present

## 2019-11-12 DIAGNOSIS — E1129 Type 2 diabetes mellitus with other diabetic kidney complication: Secondary | ICD-10-CM

## 2019-11-12 DIAGNOSIS — R809 Proteinuria, unspecified: Secondary | ICD-10-CM

## 2019-11-12 DIAGNOSIS — E7800 Pure hypercholesterolemia, unspecified: Secondary | ICD-10-CM

## 2019-11-12 LAB — POCT GLYCOSYLATED HEMOGLOBIN (HGB A1C): Hemoglobin A1C: 6 % — AB (ref 4.0–5.6)

## 2019-11-12 LAB — COMPLETE METABOLIC PANEL WITH GFR
AG Ratio: 1.8 (calc) (ref 1.0–2.5)
ALT: 18 U/L (ref 6–29)
AST: 17 U/L (ref 10–35)
Albumin: 3.8 g/dL (ref 3.6–5.1)
Alkaline phosphatase (APISO): 87 U/L (ref 31–125)
BUN: 11 mg/dL (ref 7–25)
CO2: 30 mmol/L (ref 20–32)
Calcium: 9.2 mg/dL (ref 8.6–10.2)
Chloride: 108 mmol/L (ref 98–110)
Creat: 0.9 mg/dL (ref 0.50–1.10)
GFR, Est African American: 89 mL/min/{1.73_m2} (ref >=60)
GFR, Est Non African American: 77 mL/min/{1.73_m2} (ref >=60)
Globulin: 2.1 g/dL (calc) (ref 1.9–3.7)
Glucose, Bld: 97 mg/dL (ref 65–99)
Potassium: 4.1 mmol/L (ref 3.5–5.3)
Sodium: 143 mmol/L (ref 135–146)
Total Bilirubin: 0.6 mg/dL (ref 0.2–1.2)
Total Protein: 5.9 g/dL — ABNORMAL LOW (ref 6.1–8.1)

## 2019-11-12 LAB — LIPID PANEL
Cholesterol: 135 mg/dL (ref <200)
HDL: 44 mg/dL — ABNORMAL LOW (ref >=50)
LDL Cholesterol (Calc): 73 mg/dL (calc)
Non-HDL Cholesterol (Calc): 91 mg/dL (calc) (ref <130)
Total CHOL/HDL Ratio: 3.1 (calc) (ref <5.0)
Triglycerides: 97 mg/dL (ref <150)

## 2019-11-12 NOTE — Assessment & Plan Note (Addendum)
Well controlled. Continue current regimen. Follow up in  4 mo.  Reminded to schedule f/u eye exam. Continue to work on diet and exercise.

## 2019-11-12 NOTE — Assessment & Plan Note (Signed)
Due to recheck lipids on her statin.

## 2019-11-12 NOTE — Assessment & Plan Note (Signed)
Well controlled. Continue current regimen. Follow up in  4 mo

## 2019-11-12 NOTE — Assessment & Plan Note (Signed)
Discussed options.  We could certainly switch her beta-blocker for a calcium channel blocker.  She still having the color change but says it really does not cause a lot of discomfort or pain and since that summer and she has been able to work from home and not being in the cold work office environment it has actually been doing okay so she wants to hold off on any changes but we could certainly make those at any time.

## 2019-11-12 NOTE — Progress Notes (Signed)
Established Patient Office Visit  Subjective:  Patient ID: Megan [name], female    DOB: [birth year]  Age: 49 y.o. MRN: [id number]  CC:  Chief Complaint  Patient presents with   Diabetes   Hypertension    HPI Megan [name] presents for    Hypertension- Pt denies chest pain, SOB, dizziness, or heart palpitations.  Taking meds as directed w/o problems.  Denies medication side effects.    Diabetes - no hypoglycemic events. No wounds or sores that are not healing well. No increased thirst or urination. Checking glucose at home. Taking medications as prescribed without any side effects.  Still having frequent color change of her feet.  Has been diagnosed with Raynaud's disease.  Had normal ABIs and normal labs.   Past Medical History:  Diagnosis Date   Gestational diabetes mellitus in childbirth     Past Surgical History:  Procedure Laterality Date   TONSILLECTOMY AND ADENOIDECTOMY     TYMPANOSTOMY TUBE PLACEMENT  1984    Family History  Problem Relation Age of Onset   Hypertension Mother    Hypertension Father    Hypertension Sister    Hypertension Brother    Peripheral vascular disease Maternal Grandmother    Diabetes Paternal Aunt    Diabetes Maternal Uncle    Diabetes Paternal Aunt     Social History   Socioeconomic History   Marital status: Single    Spouse name: Charmain Diosdado   Number of children: Not on file   Years of education: Not on file   Highest education level: Not on file  Occupational History   Occupation: Asst Public relations account executive    Comment: [employer]   Tobacco Use   Smoking status: Never Smoker   Smokeless tobacco: Never Used  Substance and Sexual Activity   Alcohol use: No   Drug use: No   Sexual activity: Yes    Partners: Male  Other Topics Concern   Not on file  Social History Narrative   2 caffeine drinks per day. No regular exercise. She works full-time and is going to school.   Social Determinants of Health    Financial Resource Strain:    Difficulty of Paying Living Expenses:   Food Insecurity:    Worried About Charity fundraiser in the Last Year:    Arboriculturist in the Last Year:   Transportation Needs:    Film/video editor (Medical):    Lack of Transportation (Non-Medical):   Physical Activity:    Days of Exercise per Week:    Minutes of Exercise per Session:   Stress:    Feeling of Stress :   Social Connections:    Frequency of Communication with Friends and Family:    Frequency of Social Gatherings with Friends and Family:    Attends Religious Services:    Active Member of Clubs or Organizations:    Attends Music therapist:    Marital Status:   Intimate Partner Violence:    Fear of Current or Ex-Partner:    Emotionally Abused:    Physically Abused:    Sexually Abused:     Outpatient Medications Prior to Visit  Medication Sig Dispense Refill   AMBULATORY NON FORMULARY MEDICATION One touch ultra 2 glucose system  Use to test once daily as directed  Please schedule f/u appt 1 each 0   atorvastatin (LIPITOR) 40 MG tablet Take 1 tablet (40 mg total) by mouth daily. 90 tablet 3  diclofenac sodium (VOLTAREN) 1 % GEL Apply 2 g topically 4 (four) times daily. To affected joint. 100 g 11   empagliflozin (JARDIANCE) 10 MG TABS tablet Take 10 mg by mouth daily. 90 tablet 1   losartan-hydrochlorothiazide (HYZAAR) 100-12.5 MG tablet Take 1 tablet by mouth daily. 90 tablet 1   metoprolol succinate (TOPROL-XL) 50 MG 24 hr tablet Take 1 tablet by mouth once daily 90 tablet 0   norgestrel-ethinyl estradiol (LO/OVRAL,CRYSELLE) 0.3-30 MG-MCG tablet Take 1 tablet by mouth daily.     OZEMPIC, 1 MG/DOSE, 2 MG/1.5ML SOPN INJECT 1 MG  SUBCUTANEOUSLY ONCE A WEEK 8 mL 3   No facility-administered medications prior to visit.    Allergies  Allergen Reactions   Metformin And Related Other (See Comments)    GI upset    Sertraline Nausea Only     sweats    ROS Review of Systems    Objective:    Physical Exam Constitutional:      Appearance: [name].  HENT:     Head: Normocephalic and atraumatic.  Cardiovascular:     Rate and Rhythm: Normal rate and regular rhythm.     Heart sounds: Normal heart sounds.  Pulmonary:     Effort: Pulmonary effort is normal.     Breath sounds: Normal breath sounds.  Skin:    General: Skin is warm and dry.  Neurological:     Mental Status: She is alert and oriented to person, place, and time.  Psychiatric:        Behavior: Behavior normal.     BP 124/63    Pulse 83    Ht 5' 4"  (1.626 m)    Wt 201 lb (91.2 kg)    SpO2 100%    BMI 34.50 kg/m  Wt Readings from Last 3 Encounters:  11/12/19 201 lb (91.2 kg)  07/08/19 200 lb (90.7 kg)  06/07/19 199 lb (90.3 kg)     Health Maintenance Due  Topic Date Due   Hepatitis C Screening  Never done    There are no preventive care reminders to display for this patient.  Lab Results  Component Value Date   TSH 2.00 06/07/2019   Lab Results  Component Value Date   WBC 9.1 06/07/2019   HGB 15.1 06/07/2019   HCT 44.5 06/07/2019   MCV 87.6 06/07/2019   PLT 335 06/07/2019   Lab Results  Component Value Date   NA 142 06/07/2019   K 3.5 06/07/2019   CO2 29 06/07/2019   GLUCOSE 114 06/07/2019   BUN 12 06/07/2019   CREATININE 0.85 06/07/2019   BILITOT 0.5 06/07/2019   ALKPHOS 79 06/25/2015   AST 15 06/07/2019   ALT 18 06/07/2019   PROT 6.4 06/07/2019   ALBUMIN 3.9 06/25/2015   CALCIUM 9.8 06/07/2019   Lab Results  Component Value Date   CHOL 189 02/12/2019   Lab Results  Component Value Date   HDL 41 (L) 02/12/2019   Lab Results  Component Value Date   LDLCALC 128 (H) 02/12/2019   Lab Results  Component Value Date   TRIG 98 02/12/2019   Lab Results  Component Value Date   CHOLHDL 4.6 02/12/2019   Lab Results  Component Value Date   HGBA1C 6.0 (A) 11/12/2019      Assessment & Plan:   Problem  List Items Addressed This Visit      Cardiovascular and Mediastinum   Raynaud's disease, idiopathic    Discussed options.  We could  certainly switch her beta-blocker for a calcium channel blocker.  She still having the color change but says it really does not cause a lot of discomfort or pain and since that summer and she has been able to work from home and not being in the cold work office environment it has actually been doing okay so she wants to hold off on any changes but we could certainly make those at any time.      Essential hypertension, benign - Primary    Well controlled. Continue current regimen. Follow up in  4 mo      Relevant Orders   COMPLETE METABOLIC PANEL WITH GFR   Lipid panel     Endocrine   Microalbuminuria due to type 2 diabetes mellitus ([place])   Diabetes type 2, controlled ([place])    Well controlled. Continue current regimen. Follow up in  4 mo.  Reminded to schedule f/u eye exam. Continue to work on diet and exercise.       Relevant Orders   POCT glycosylated hemoglobin (Hb A1C) (Completed)   COMPLETE METABOLIC PANEL WITH GFR   Lipid panel     Other   Hyperlipidemia    Due to recheck lipids on her statin.        Other Visit Diagnoses    Screening for malignant neoplasm of colon       Relevant Orders   Ambulatory referral to Gastroenterology      No orders of the defined types were placed in this encounter.   Follow-up: Return in about 4 months (around 03/14/2020) for Diabetes follow-up.    Beatrice Lecher, MD

## 2019-11-27 ENCOUNTER — Other Ambulatory Visit: Payer: Self-pay | Admitting: *Deleted

## 2019-11-27 MED ORDER — ATORVASTATIN CALCIUM 40 MG PO TABS: 40.0000 mg | ORAL_TABLET | Freq: Every day | ORAL | 3 refills | Status: DC

## 2019-12-01 ENCOUNTER — Other Ambulatory Visit: Payer: Self-pay | Admitting: Family Medicine

## 2019-12-08 ENCOUNTER — Encounter: Payer: Self-pay | Admitting: Family Medicine

## 2019-12-08 DIAGNOSIS — I10 Essential (primary) hypertension: Secondary | ICD-10-CM

## 2019-12-09 MED ORDER — METOPROLOL SUCCINATE ER 50 MG PO TB24: 50.0000 mg | ORAL_TABLET | Freq: Every day | ORAL | 0 refills | Status: DC

## 2020-01-15 ENCOUNTER — Other Ambulatory Visit: Payer: Self-pay | Admitting: Family Medicine

## 2020-01-15 DIAGNOSIS — I10 Essential (primary) hypertension: Secondary | ICD-10-CM

## 2020-01-30 ENCOUNTER — Other Ambulatory Visit: Payer: Self-pay | Admitting: *Deleted

## 2020-01-30 ENCOUNTER — Other Ambulatory Visit: Payer: Self-pay

## 2020-01-30 ENCOUNTER — Encounter: Payer: Self-pay | Admitting: Family Medicine

## 2020-01-30 DIAGNOSIS — I10 Essential (primary) hypertension: Secondary | ICD-10-CM

## 2020-01-30 DIAGNOSIS — E119 Type 2 diabetes mellitus without complications: Secondary | ICD-10-CM

## 2020-01-30 MED ORDER — METOPROLOL SUCCINATE ER 50 MG PO TB24: 50.0000 mg | ORAL_TABLET | Freq: Every day | ORAL | 3 refills | Status: DC

## 2020-01-31 ENCOUNTER — Other Ambulatory Visit: Payer: Self-pay | Admitting: *Deleted

## 2020-01-31 MED ORDER — LOSARTAN POTASSIUM-HCTZ 100-12.5 MG PO TABS: 1.0000 | ORAL_TABLET | Freq: Every day | ORAL | 3 refills | Status: DC

## 2020-01-31 MED ORDER — OZEMPIC (1 MG/DOSE) 2 MG/1.5ML ~~LOC~~ SOPN: PEN_INJECTOR | SUBCUTANEOUS | 4 refills | Status: DC

## 2020-01-31 MED ORDER — ATORVASTATIN CALCIUM 40 MG PO TABS: 40.0000 mg | ORAL_TABLET | Freq: Every day | ORAL | 3 refills | Status: DC

## 2020-01-31 MED ORDER — EMPAGLIFLOZIN 10 MG PO TABS: 10.0000 mg | ORAL_TABLET | Freq: Every day | ORAL | 1 refills | Status: DC

## 2020-01-31 MED ORDER — METOPROLOL SUCCINATE ER 50 MG PO TB24: 50.0000 mg | ORAL_TABLET | Freq: Every day | ORAL | 3 refills | Status: DC

## 2020-02-03 ENCOUNTER — Other Ambulatory Visit: Payer: Self-pay | Admitting: *Deleted

## 2020-02-03 ENCOUNTER — Telehealth: Payer: Self-pay | Admitting: Family Medicine

## 2020-02-03 DIAGNOSIS — E119 Type 2 diabetes mellitus without complications: Secondary | ICD-10-CM

## 2020-02-03 MED ORDER — EMPAGLIFLOZIN 10 MG PO TABS: 10.0000 mg | ORAL_TABLET | Freq: Every day | ORAL | 1 refills | Status: DC

## 2020-02-03 MED ORDER — OZEMPIC (1 MG/DOSE) 2 MG/1.5ML ~~LOC~~ SOPN: PEN_INJECTOR | SUBCUTANEOUS | 4 refills | Status: DC

## 2020-02-03 NOTE — Telephone Encounter (Signed)
Received fax for PA on Ozempic sent through cover my meds and received authorization.   Case Id: [id number] Valid: 01/04/2020 - 02/02/2023  I am faxing to pharmacy to have medication filled for patient. - CF

## 2020-02-05 ENCOUNTER — Telehealth: Payer: Self-pay | Admitting: Family Medicine

## 2020-02-05 NOTE — Telephone Encounter (Signed)
Received fax for PA on Jardiance sent through cover my meds and received authorization.   Case: [id number] Valid:  01/06/20 - 02/04/21  I am faxing to pharmacy to fill medication for patient. - CF

## 2020-02-08 LAB — HM DIABETES EYE EXAM

## 2020-02-11 ENCOUNTER — Encounter: Payer: Self-pay | Admitting: Family Medicine

## 2020-03-09 ENCOUNTER — Encounter: Payer: Self-pay | Admitting: Family Medicine

## 2020-03-09 ENCOUNTER — Other Ambulatory Visit: Payer: Self-pay

## 2020-03-09 ENCOUNTER — Ambulatory Visit (INDEPENDENT_AMBULATORY_CARE_PROVIDER_SITE_OTHER): Payer: BC Managed Care – PPO | Admitting: Family Medicine

## 2020-03-09 VITALS — BP 135/72 | HR 90 | Ht 64.0 in | Wt 197.0 lb

## 2020-03-09 DIAGNOSIS — E1129 Type 2 diabetes mellitus with other diabetic kidney complication: Secondary | ICD-10-CM

## 2020-03-09 DIAGNOSIS — I10 Essential (primary) hypertension: Secondary | ICD-10-CM | POA: Diagnosis not present

## 2020-03-09 DIAGNOSIS — R809 Proteinuria, unspecified: Secondary | ICD-10-CM | POA: Diagnosis not present

## 2020-03-09 DIAGNOSIS — M79675 Pain in left toe(s): Secondary | ICD-10-CM

## 2020-03-09 DIAGNOSIS — F439 Reaction to severe stress, unspecified: Secondary | ICD-10-CM

## 2020-03-09 DIAGNOSIS — E119 Type 2 diabetes mellitus without complications: Secondary | ICD-10-CM

## 2020-03-09 DIAGNOSIS — S90426A Blister (nonthermal), unspecified lesser toe(s), initial encounter: Secondary | ICD-10-CM

## 2020-03-09 DIAGNOSIS — I7300 Raynaud's syndrome without gangrene: Secondary | ICD-10-CM

## 2020-03-09 LAB — POCT UA - MICROALBUMIN
Creatinine, POC: 300 mg/dL
Microalbumin Ur, POC: 150 mg/L

## 2020-03-09 LAB — POCT GLYCOSYLATED HEMOGLOBIN (HGB A1C): Hemoglobin A1C: 5.8 % — AB (ref 4.0–5.6)

## 2020-03-09 MED ORDER — AMLODIPINE BESYLATE 5 MG PO TABS: 5.0000 mg | ORAL_TABLET | Freq: Every day | ORAL | 0 refills | Status: DC

## 2020-03-09 NOTE — Progress Notes (Signed)
Established Patient Office Visit  Subjective:  Patient ID: Megan [name], female    DOB: August 30, 1970  Age: 49 y.o. MRN: [id number]  CC:  Chief Complaint  Patient presents with  . Diabetes    HPI Megan [name] presents for   Hypertension- Pt denies chest pain, SOB, dizziness, or heart palpitations.  Taking meds as directed w/o problems.  Denies medication side effects.    Diabetes - no hypoglycemic events. No wounds or sores that are not healing well. No increased thirst or urination. Checking glucose at home. Taking medications as prescribed without any side effects.  Left great toe injury - dropped something on it and getting a bruise under the nail.  Getting a blister on her 2nd and 3rd toes on the same foot.  No injury or trauma to those toes.   She doesn't wear shoes most of the time.  Feels her raynauds getting worse.   Says her symptoms are daily.  She says at night particularly she will get a lot of burning and itching sensation.   Past Medical History:  Diagnosis Date  . Gestational diabetes mellitus in childbirth     Past Surgical History:  Procedure Laterality Date  . TONSILLECTOMY AND ADENOIDECTOMY    . TYMPANOSTOMY TUBE PLACEMENT  1984    Family History  Problem Relation Age of Onset  . Hypertension Mother   . Hypertension Father   . Hypertension Sister   . Hypertension Brother   . Peripheral vascular disease Maternal Grandmother   . Diabetes Paternal Aunt   . Diabetes Maternal Uncle   . Diabetes Paternal Aunt     Social History   Socioeconomic History  . Marital status: Single    Spouse name: Betania Dizon  . Number of children: Not on file  . Years of education: Not on file  . Highest education level: Not on file  Occupational History  . Occupation: Asst Public relations account executive    Comment: [employer]   Tobacco Use  . Smoking status: Never Smoker  . Smokeless tobacco: Never Used  Substance and Sexual Activity  . Alcohol use: No  . Drug use: No  .  Sexual activity: Yes    Partners: Male  Other Topics Concern  . Not on file  Social History Narrative   2 caffeine drinks per day. No regular exercise. She works full-time and is going to school.   Social Determinants of Health   Financial Resource Strain:   . Difficulty of Paying Living Expenses: Not on file  Food Insecurity:   . Worried About Charity fundraiser in the Last Year: Not on file  . Ran Out of Food in the Last Year: Not on file  Transportation Needs:   . Lack of Transportation (Medical): Not on file  . Lack of Transportation (Non-Medical): Not on file  Physical Activity:   . Days of Exercise per Week: Not on file  . Minutes of Exercise per Session: Not on file  Stress:   . Feeling of Stress : Not on file  Social Connections:   . Frequency of Communication with Friends and Family: Not on file  . Frequency of Social Gatherings with Friends and Family: Not on file  . Attends Religious Services: Not on file  . Active Member of Clubs or Organizations: Not on file  . Attends Archivist Meetings: Not on file  . Marital Status: Not on file  Intimate Partner Violence:   . Fear of Current or  Ex-Partner: Not on file  . Emotionally Abused: Not on file  . Physically Abused: Not on file  . Sexually Abused: Not on file    Outpatient Medications Prior to Visit  Medication Sig Dispense Refill  . AMBULATORY NON FORMULARY MEDICATION One touch ultra 2 glucose system  Use to test once daily as directed  Please schedule f/u appt 1 each 0  . atorvastatin (LIPITOR) 40 MG tablet Take 1 tablet (40 mg total) by mouth daily. 90 tablet 3  . diclofenac sodium (VOLTAREN) 1 % GEL Apply 2 g topically 4 (four) times daily. To affected joint. 100 g 11  . empagliflozin (JARDIANCE) 10 MG TABS tablet Take 1 tablet (10 mg total) by mouth daily. 90 tablet 1  . losartan-hydrochlorothiazide (HYZAAR) 100-12.5 MG tablet Take 1 tablet by mouth daily. 90 tablet 3  . metroNIDAZOLE (METROGEL)  1 % gel Apply topically daily.    . norgestrel-ethinyl estradiol (LO/OVRAL,CRYSELLE) 0.3-30 MG-MCG tablet Take 1 tablet by mouth daily.    . Semaglutide, 1 MG/DOSE, (OZEMPIC, 1 MG/DOSE,) 2 MG/1.5ML SOPN INJECT 1MG DOSE INTO THE SKIN EVERY WEEK 18 mL 4  . metoprolol succinate (TOPROL-XL) 50 MG 24 hr tablet Take 1 tablet (50 mg total) by mouth daily. Take with or immediately following a meal. 90 tablet 3  . doxycycline (VIBRAMYCIN) 100 MG capsule Take 100 mg by mouth daily.     No facility-administered medications prior to visit.    Allergies  Allergen Reactions  . Metformin And Related Other (See Comments)    GI upset   . Sertraline Nausea Only    sweats    ROS Review of Systems    Objective:    Physical Exam Constitutional:      Appearance: [name].  HENT:     Head: Normocephalic and atraumatic.  Cardiovascular:     Rate and Rhythm: Normal rate and regular rhythm.     Heart sounds: Normal heart sounds.  Pulmonary:     Effort: Pulmonary effort is normal.     Breath sounds: Normal breath sounds.  Skin:    General: Skin is warm and dry.     Comments: Hematoma under left great toenail its not acute.  She does have a blister on the top of the third toe at the base of the nail and what looks like maybe a new one forming on the top of the second toe at the base of the nail.  Neurological:     Mental Status: She is alert and oriented to person, place, and time.  Psychiatric:        Behavior: Behavior normal.     BP 135/72   Pulse 90   Ht 5' 4"  (1.626 m)   Wt 197 lb (89.4 kg)   SpO2 98%   BMI 33.81 kg/m  Wt Readings from Last 3 Encounters:  03/09/20 197 lb (89.4 kg)  11/12/19 201 lb (91.2 kg)  07/08/19 200 lb (90.7 kg)     There are no preventive care reminders to display for this patient.  There are no preventive care reminders to display for this patient.  Lab Results  Component Value Date   TSH 2.00 06/07/2019   Lab Results  Component Value  Date   WBC 9.1 06/07/2019   HGB 15.1 06/07/2019   HCT 44.5 06/07/2019   MCV 87.6 06/07/2019   PLT 335 06/07/2019   Lab Results  Component Value Date   NA 143 11/12/2019   K 4.1 11/12/2019  CO2 30 11/12/2019   GLUCOSE 97 11/12/2019   BUN 11 11/12/2019   CREATININE 0.90 11/12/2019   BILITOT 0.6 11/12/2019   ALKPHOS 79 06/25/2015   AST 17 11/12/2019   ALT 18 11/12/2019   PROT 5.9 (L) 11/12/2019   ALBUMIN 3.9 06/25/2015   CALCIUM 9.2 11/12/2019   Lab Results  Component Value Date   CHOL 135 11/12/2019   Lab Results  Component Value Date   HDL 44 (L) 11/12/2019   Lab Results  Component Value Date   LDLCALC 73 11/12/2019   Lab Results  Component Value Date   TRIG 97 11/12/2019   Lab Results  Component Value Date   CHOLHDL 3.1 11/12/2019   Lab Results  Component Value Date   HGBA1C 5.8 (A) 03/09/2020      Assessment & Plan:   Problem List Items Addressed This Visit      Cardiovascular and Mediastinum   Raynaud's disease, idiopathic    Gust options.  She really does not want to add another pill to her regimen so discussed switching her metoprolol to amlodipine to still help lower blood pressure but to also provide some relief with her Raynaud's.  Explained how calcium channel blockers can help with blood vessel dilatation and can help with Raynaud's symptoms especially since she notes that they are now much more persistent and really daily.  Like to give this a try over the next month or 2 to see if she feels like it is helpful.      Relevant Medications   amLODipine (NORVASC) 5 MG tablet   Essential hypertension, benign - Primary    Well controlled. Continue current regimen. Follow up in  6 months.       Relevant Medications   amLODipine (NORVASC) 5 MG tablet     Endocrine   Microalbuminuria due to type 2 diabetes mellitus ([place])   Relevant Orders   POCT UA - Microalbumin (Completed)   Diabetes type 2, controlled ([place])    A1c looks phenomenal today at  5.8.  Great work.  Continue current regimen.  Continue with Jardiance and Ozempic.  She says her Vania Rea will likely need prior authorization before the end of the year.  Lab Results  Component Value Date   HGBA1C 5.8 (A) 03/09/2020         Relevant Orders   POCT glycosylated hemoglobin (Hb A1C) (Completed)    Other Visit Diagnoses    Stress       Relevant Orders   Ambulatory referral to Behavioral Health   Great toe pain, left       Blister of third toe         Left great toe pain-she does have a hematoma underneath the nail but it looks like it is trying to heal.  I do not think she will end up losing the nail as her still a fair amount of the nailbed that still attached.  BLister of third and second toe it is at the base of the nailbed.  Unclear etiology she does remember any injury or trauma nothing spilled or chemically got on the foot itself just encouraged her to monitor it and watch it heal this is not typical with Raynaud's.  But if continues to recur then please let me know.  Meds ordered this encounter  Medications  . amLODipine (NORVASC) 5 MG tablet    Sig: Take 1 tablet (5 mg total) by mouth daily.    Dispense:  90 tablet  Refill:  0    Follow-up: Return in about 4 months (around 07/07/2020) for Diabetes follow-up.    Beatrice Lecher, MD

## 2020-03-09 NOTE — Assessment & Plan Note (Signed)
Well controlled. Continue current regimen. Follow up in  6 months.

## 2020-03-09 NOTE — Assessment & Plan Note (Signed)
A1c looks phenomenal today at 5.8.  Great work.  Continue current regimen.  Continue with Jardiance and Ozempic.  She says her Vania Rea will likely need prior authorization before the end of the year.  Lab Results  Component Value Date   HGBA1C 5.8 (A) 03/09/2020

## 2020-03-09 NOTE — Assessment & Plan Note (Signed)
Gust options.  She really does not want to add another pill to her regimen so discussed switching her metoprolol to amlodipine to still help lower blood pressure but to also provide some relief with her Raynaud's.  Explained how calcium channel blockers can help with blood vessel dilatation and can help with Raynaud's symptoms especially since she notes that they are now much more persistent and really daily.  Like to give this a try over the next month or 2 to see if she feels like it is helpful.

## 2020-03-10 ENCOUNTER — Ambulatory Visit: Payer: 59 | Admitting: Family Medicine

## 2020-03-12 ENCOUNTER — Ambulatory Visit (INDEPENDENT_AMBULATORY_CARE_PROVIDER_SITE_OTHER): Payer: BC Managed Care – PPO | Admitting: Professional

## 2020-03-12 DIAGNOSIS — F411 Generalized anxiety disorder: Secondary | ICD-10-CM | POA: Diagnosis not present

## 2020-03-16 ENCOUNTER — Ambulatory Visit: Payer: 59 | Admitting: Family Medicine

## 2020-03-17 ENCOUNTER — Ambulatory Visit (INDEPENDENT_AMBULATORY_CARE_PROVIDER_SITE_OTHER): Payer: BC Managed Care – PPO | Admitting: Professional

## 2020-03-17 DIAGNOSIS — F411 Generalized anxiety disorder: Secondary | ICD-10-CM | POA: Diagnosis not present

## 2020-03-24 ENCOUNTER — Ambulatory Visit (INDEPENDENT_AMBULATORY_CARE_PROVIDER_SITE_OTHER): Payer: BC Managed Care – PPO | Admitting: Professional

## 2020-03-24 DIAGNOSIS — F411 Generalized anxiety disorder: Secondary | ICD-10-CM | POA: Diagnosis not present

## 2020-03-30 ENCOUNTER — Ambulatory Visit: Payer: BC Managed Care – PPO | Admitting: Professional

## 2020-04-06 ENCOUNTER — Ambulatory Visit (INDEPENDENT_AMBULATORY_CARE_PROVIDER_SITE_OTHER): Payer: BC Managed Care – PPO | Admitting: Professional

## 2020-04-06 ENCOUNTER — Encounter: Payer: Self-pay | Admitting: Family Medicine

## 2020-04-06 DIAGNOSIS — F411 Generalized anxiety disorder: Secondary | ICD-10-CM

## 2020-04-17 ENCOUNTER — Ambulatory Visit: Payer: BC Managed Care – PPO | Admitting: Professional

## 2020-04-22 ENCOUNTER — Ambulatory Visit (INDEPENDENT_AMBULATORY_CARE_PROVIDER_SITE_OTHER): Payer: BC Managed Care – PPO | Admitting: Professional

## 2020-04-22 DIAGNOSIS — F411 Generalized anxiety disorder: Secondary | ICD-10-CM

## 2020-05-06 ENCOUNTER — Ambulatory Visit: Payer: BC Managed Care – PPO | Admitting: Professional

## 2020-05-08 ENCOUNTER — Ambulatory Visit: Payer: BC Managed Care – PPO | Admitting: Professional

## 2020-05-13 ENCOUNTER — Ambulatory Visit: Payer: BC Managed Care – PPO | Admitting: Professional

## 2020-05-20 ENCOUNTER — Ambulatory Visit (INDEPENDENT_AMBULATORY_CARE_PROVIDER_SITE_OTHER): Payer: 59 | Admitting: Professional

## 2020-05-20 DIAGNOSIS — F411 Generalized anxiety disorder: Secondary | ICD-10-CM | POA: Diagnosis not present

## 2020-05-27 ENCOUNTER — Ambulatory Visit (INDEPENDENT_AMBULATORY_CARE_PROVIDER_SITE_OTHER): Payer: 59 | Admitting: Professional

## 2020-05-27 DIAGNOSIS — F411 Generalized anxiety disorder: Secondary | ICD-10-CM

## 2020-06-03 ENCOUNTER — Ambulatory Visit (INDEPENDENT_AMBULATORY_CARE_PROVIDER_SITE_OTHER): Payer: 59 | Admitting: Professional

## 2020-06-03 DIAGNOSIS — F411 Generalized anxiety disorder: Secondary | ICD-10-CM | POA: Diagnosis not present

## 2020-06-10 ENCOUNTER — Ambulatory Visit: Payer: 59 | Admitting: Professional

## 2020-06-17 ENCOUNTER — Ambulatory Visit: Payer: Self-pay | Admitting: Professional

## 2020-06-22 ENCOUNTER — Telehealth: Payer: Self-pay | Admitting: Family Medicine

## 2020-06-22 NOTE — Telephone Encounter (Signed)
Please call pt to schedule her eye exam.

## 2020-06-24 ENCOUNTER — Ambulatory Visit (INDEPENDENT_AMBULATORY_CARE_PROVIDER_SITE_OTHER): Payer: 59 | Admitting: Professional

## 2020-06-24 DIAGNOSIS — F411 Generalized anxiety disorder: Secondary | ICD-10-CM

## 2020-06-24 NOTE — Telephone Encounter (Signed)
My eye doctor [facility] will call for records  [phone]

## 2020-07-01 ENCOUNTER — Ambulatory Visit: Payer: 59 | Admitting: Professional

## 2020-07-07 ENCOUNTER — Other Ambulatory Visit: Payer: Self-pay

## 2020-07-07 ENCOUNTER — Ambulatory Visit (INDEPENDENT_AMBULATORY_CARE_PROVIDER_SITE_OTHER): Payer: 59 | Admitting: Family Medicine

## 2020-07-07 ENCOUNTER — Encounter: Payer: Self-pay | Admitting: Family Medicine

## 2020-07-07 VITALS — BP 132/69 | HR 92 | Ht 64.0 in | Wt 185.0 lb

## 2020-07-07 DIAGNOSIS — I10 Essential (primary) hypertension: Secondary | ICD-10-CM

## 2020-07-07 DIAGNOSIS — I7300 Raynaud's syndrome without gangrene: Secondary | ICD-10-CM

## 2020-07-07 DIAGNOSIS — E119 Type 2 diabetes mellitus without complications: Secondary | ICD-10-CM

## 2020-07-07 DIAGNOSIS — R634 Abnormal weight loss: Secondary | ICD-10-CM

## 2020-07-07 DIAGNOSIS — R1013 Epigastric pain: Secondary | ICD-10-CM

## 2020-07-07 DIAGNOSIS — K529 Noninfective gastroenteritis and colitis, unspecified: Secondary | ICD-10-CM

## 2020-07-07 DIAGNOSIS — E1129 Type 2 diabetes mellitus with other diabetic kidney complication: Secondary | ICD-10-CM

## 2020-07-07 DIAGNOSIS — R809 Proteinuria, unspecified: Secondary | ICD-10-CM

## 2020-07-07 LAB — POCT GLYCOSYLATED HEMOGLOBIN (HGB A1C): Hemoglobin A1C: 6.4 % — AB (ref 4.0–5.6)

## 2020-07-07 MED ORDER — AMLODIPINE BESYLATE 5 MG PO TABS: 5.0000 mg | ORAL_TABLET | Freq: Every day | ORAL | 3 refills | Status: DC

## 2020-07-07 NOTE — Assessment & Plan Note (Signed)
We may need to adjust her losartan HCTZ if blood pressures are continuing to drop below encouraged her to check her blood pressure daily for the next 2 weeks.

## 2020-07-07 NOTE — Progress Notes (Signed)
Established Patient Office Visit  Subjective:  Patient ID: Megan [name], female    DOB: 06-17-1970  Age: 50 y.o. MRN: [id number]  CC:  Chief Complaint  Patient presents with  . Diabetes    HPI Rexann L Wagman presents for   Diabetes - no hypoglycemic events. No wounds or sores that are not healing well. No increased thirst or urination. Checking glucose at home. Taking medications as prescribed without any side effects.  Mostly eats at home and rarely eats out.  Hypertension- Pt denies chest pain, SOB, dizziness, or heart palpitations.  Taking meds as directed w/o problems.  Denies medication side effects.  She has been checking blood pressures intermittently and sometimes they are as low as 96/45 but at other times they are in the 140s.  She also reports that she has been having several months of just unhappy got.  She says when she eats she will immediately start to feel her stomach rumble.  She says is not painful she does not experience cramping.  And then usually it is followed by diarrhea.  Oftentimes she will start to eat a meal and then just stop because she will start to get the rumbling in her stomach and she knows that it might actually trigger diarrhea she has not figured out a specific pattern to what is causing it.  She says that she has had some problems with dairy before this.  But says even eating nondairy foods seem to trigger the symptoms.  She is noticed a decreased appetite because of this.  Occasionally she will actually feel nauseated with it but it does not usually vomit.  She has been working 12-hour days with her current job on the [employer] time and so has not been able to exercise but has actually lost 10 pounds in the last 2 months without trying.  No blood in the stool or urine.  No known sick contacts.  Family history positive for mom with celiac disease, maternal cousin with Crohn's disease, and maternal grandfather with colon cancer.  She is never had any colon  cancer screening.  Follow-up Raynaud's-she does feel the amlodipine has actually been working really well to help control her Raynaud's symptoms she is happy with the results but has noticed that occasionally her blood pressures drop a little bit low.   Past Medical History:  Diagnosis Date  . Gestational diabetes mellitus in childbirth     Past Surgical History:  Procedure Laterality Date  . TONSILLECTOMY AND ADENOIDECTOMY    . TYMPANOSTOMY TUBE PLACEMENT  1984    Family History  Problem Relation Age of Onset  . Hypertension Mother   . Celiac disease Mother   . Hypertension Father   . Hypertension Sister   . Hypertension Brother   . Peripheral vascular disease Maternal Grandmother   . Diabetes Paternal Aunt   . Diabetes Maternal Uncle   . Diabetes Paternal Aunt   . Colon cancer Maternal Grandfather   . Crohn's disease Cousin     Social History   Socioeconomic History  . Marital status: Single    Spouse name: Siedah Sedor  . Number of children: Not on file  . Years of education: Not on file  . Highest education level: Not on file  Occupational History  . Occupation: Asst Public relations account executive    Comment: [employer]   Tobacco Use  . Smoking status: Never Smoker  . Smokeless tobacco: Never Used  Substance and Sexual Activity  . Alcohol  use: No  . Drug use: No  . Sexual activity: Yes    Partners: Male  Other Topics Concern  . Not on file  Social History Narrative   2 caffeine drinks per day. No regular exercise. She works full-time and is going to school.   Social Determinants of Health   Financial Resource Strain: Not on file  Food Insecurity: Not on file  Transportation Needs: Not on file  Physical Activity: Not on file  Stress: Not on file  Social Connections: Not on file  Intimate Partner Violence: Not on file    Outpatient Medications Prior to Visit  Medication Sig Dispense Refill  . AMBULATORY NON FORMULARY MEDICATION One touch ultra 2 glucose  system  Use to test once daily as directed  Please schedule f/u appt 1 each 0  . atorvastatin (LIPITOR) 40 MG tablet Take 1 tablet (40 mg total) by mouth daily. 90 tablet 3  . diclofenac sodium (VOLTAREN) 1 % GEL Apply 2 g topically 4 (four) times daily. To affected joint. 100 g 11  . empagliflozin (JARDIANCE) 10 MG TABS tablet Take 1 tablet (10 mg total) by mouth daily. 90 tablet 1  . losartan-hydrochlorothiazide (HYZAAR) 100-12.5 MG tablet Take 1 tablet by mouth daily. 90 tablet 3  . metroNIDAZOLE (METROGEL) 1 % gel Apply topically daily.    . norgestrel-ethinyl estradiol (LO/OVRAL,CRYSELLE) 0.3-30 MG-MCG tablet Take 1 tablet by mouth daily.    . Semaglutide, 1 MG/DOSE, (OZEMPIC, 1 MG/DOSE,) 2 MG/1.5ML SOPN INJECT 1MG DOSE INTO THE SKIN EVERY WEEK 18 mL 4  . amLODipine (NORVASC) 5 MG tablet Take 1 tablet (5 mg total) by mouth daily. 90 tablet 0   No facility-administered medications prior to visit.    Allergies  Allergen Reactions  . Metformin And Related Other (See Comments)    GI upset   . Sertraline Nausea Only    sweats    ROS Review of Systems    Objective:    Physical Exam Constitutional:      Appearance: [name] and well-nourished.  HENT:     Head: Normocephalic and atraumatic.  Cardiovascular:     Rate and Rhythm: Normal rate and regular rhythm.     Heart sounds: Normal heart sounds.  Pulmonary:     Effort: Pulmonary effort is normal.     Breath sounds: Normal breath sounds.  Abdominal:     General: Abdomen is flat. Bowel sounds are normal.     Palpations: Abdomen is soft.     Tenderness: There is abdominal tenderness. There is no guarding or rebound.       Comments: Circles indicate areas of tenderness.    Skin:    General: Skin is warm and dry.  Neurological:     Mental Status: She is alert and oriented to person, place, and time.  Psychiatric:        Mood and Affect: Mood and affect normal.        Behavior: Behavior normal.     BP  132/69   Pulse 92   Ht 5' 4"  (1.626 m)   Wt 185 lb (83.9 kg)   SpO2 100%   BMI 31.76 kg/m  Wt Readings from Last 3 Encounters:  07/07/20 185 lb (83.9 kg)  03/09/20 197 lb (89.4 kg)  11/12/19 201 lb (91.2 kg)     Health Maintenance Due  Topic Date Due  . COLONOSCOPY (Pts 45-74yr Insurance coverage will need to be confirmed)  Never done    There  are no preventive care reminders to display for this patient.  Lab Results  Component Value Date   TSH 2.00 06/07/2019   Lab Results  Component Value Date   WBC 9.1 06/07/2019   HGB 15.1 06/07/2019   HCT 44.5 06/07/2019   MCV 87.6 06/07/2019   PLT 335 06/07/2019   Lab Results  Component Value Date   NA 143 11/12/2019   K 4.1 11/12/2019   CO2 30 11/12/2019   GLUCOSE 97 11/12/2019   BUN 11 11/12/2019   CREATININE 0.90 11/12/2019   BILITOT 0.6 11/12/2019   ALKPHOS 79 06/25/2015   AST 17 11/12/2019   ALT 18 11/12/2019   PROT 5.9 (L) 11/12/2019   ALBUMIN 3.9 06/25/2015   CALCIUM 9.2 11/12/2019   Lab Results  Component Value Date   CHOL 135 11/12/2019   Lab Results  Component Value Date   HDL 44 (L) 11/12/2019   Lab Results  Component Value Date   LDLCALC 73 11/12/2019   Lab Results  Component Value Date   TRIG 97 11/12/2019   Lab Results  Component Value Date   CHOLHDL 3.1 11/12/2019   Lab Results  Component Value Date   HGBA1C 6.4 (A) 07/07/2020      Assessment & Plan:   Problem List Items Addressed This Visit      Cardiovascular and Mediastinum   Raynaud's disease, idiopathic    Doing well with amlodipine.  Continue current regimen.  Refill sent to pharmacy.  We may need to adjust her losartan HCTZ if blood pressures are continuing to drop below encouraged her to check her blood pressure daily for the next 2 weeks.      Relevant Medications   amLODipine (NORVASC) 5 MG tablet   Essential hypertension, benign - Primary     We may need to adjust her losartan HCTZ if blood pressures are continuing  to drop below encouraged her to check her blood pressure daily for the next 2 weeks.       Relevant Medications   amLODipine (NORVASC) 5 MG tablet   Other Relevant Orders   BASIC METABOLIC PANEL WITH GFR     Endocrine   Microalbuminuria due to type 2 diabetes mellitus ([place])    Currently on ARB.      Diabetes type 2, controlled ([place])    A1c jumped up to 6.4 today.  But I do think some of it has to do with decreased p.o. intake.  She is probably getting some rebound hyperglycemia.  We will monitor carefully some to see her back in 3 months.      Relevant Orders   BASIC METABOLIC PANEL WITH GFR   POCT glycosylated hemoglobin (Hb A1C) (Completed)    Other Visit Diagnoses    Dyspepsia       Relevant Orders   BASIC METABOLIC PANEL WITH GFR   Celiac Disease Panel   Saccharomyces cerevisiae antibodies, IgG and IgA   CBC with Differential/Platelet   COMPLETE METABOLIC PANEL WITH GFR   Lipase   Stool culture   Clostridium difficile Toxin B, Qualitative, Real-Time PCR   Ambulatory referral to Gastroenterology   Salmonella/Shigella Cult, Campy EIA and Shiga Toxin reflex   Chronic diarrhea       Relevant Orders   BASIC METABOLIC PANEL WITH GFR   Celiac Disease Panel   Saccharomyces cerevisiae antibodies, IgG and IgA   CBC with Differential/Platelet   COMPLETE METABOLIC PANEL WITH GFR   Lipase   Stool culture   Clostridium  difficile Toxin B, Qualitative, Real-Time PCR   Ambulatory referral to Gastroenterology   Salmonella/Shigella Cult, Campy EIA and Shiga Toxin reflex   Abnormal weight loss         Persistent diarrhea often triggered by eating for several months with abnormal weight loss-discussed potential causes.  Will evaluate further for celiac and Crohn's disease especially based on family history.  I think colitis is less likely but we will going to do a stool culture and check for C. difficile as well.  We did discuss getting her in with GI for further evaluation  especially with the weight loss.  Also let her know that updated guidelines for colon cancer screening is now at 60 and she does have a grandparent who had colon cancer.   Meds ordered this encounter  Medications  . amLODipine (NORVASC) 5 MG tablet    Sig: Take 1 tablet (5 mg total) by mouth daily.    Dispense:  90 tablet    Refill:  3    Follow-up: Return in about 3 months (around 10/07/2020) for A!C and GI sxs.    Beatrice Lecher, MD

## 2020-07-07 NOTE — Assessment & Plan Note (Signed)
Doing well with amlodipine.  Continue current regimen.  Refill sent to pharmacy.  We may need to adjust her losartan HCTZ if blood pressures are continuing to drop below encouraged her to check her blood pressure daily for the next 2 weeks.

## 2020-07-07 NOTE — Assessment & Plan Note (Signed)
A1c jumped up to 6.4 today.  But I do think some of it has to do with decreased p.o. intake.  She is probably getting some rebound hyperglycemia.  We will monitor carefully some to see her back in 3 months.

## 2020-07-07 NOTE — Assessment & Plan Note (Signed)
Currently on ARB.

## 2020-07-08 ENCOUNTER — Ambulatory Visit (INDEPENDENT_AMBULATORY_CARE_PROVIDER_SITE_OTHER): Payer: 59 | Admitting: Professional

## 2020-07-08 DIAGNOSIS — F411 Generalized anxiety disorder: Secondary | ICD-10-CM

## 2020-07-09 LAB — SALMONELLA/SHIGELLA CULT, CAMPY EIA AND SHIGA TOXIN RFL ECOLI
MICRO NUMBER: 11593208
MICRO NUMBER:: 11593209
MICRO NUMBER:: 11593210
Result:: NOT DETECTED
SHIGA RESULT:: NOT DETECTED
SPECIMEN QUALITY: ADEQUATE
SPECIMEN QUALITY:: ADEQUATE
SPECIMEN QUALITY:: ADEQUATE

## 2020-07-09 LAB — CLOSTRIDIUM DIFFICILE TOXIN B, QUALITATIVE, REAL-TIME PCR: Toxigenic C. Difficile by PCR: NOT DETECTED

## 2020-07-10 LAB — CBC WITH DIFFERENTIAL/PLATELET
Absolute Monocytes: 495 cells/uL (ref 200–950)
Basophils Absolute: 23 cells/uL (ref 0–200)
Basophils Relative: 0.3 %
Eosinophils Absolute: 83 cells/uL (ref 15–500)
Eosinophils Relative: 1.1 %
HCT: 46.5 % — ABNORMAL HIGH (ref 35.0–45.0)
Hemoglobin: 15.9 g/dL — ABNORMAL HIGH (ref 11.7–15.5)
Lymphs Abs: 1718 cells/uL (ref 850–3900)
MCH: 30.4 pg (ref 27.0–33.0)
MCHC: 34.2 g/dL (ref 32.0–36.0)
MCV: 88.9 fL (ref 80.0–100.0)
MPV: 9.7 fL (ref 7.5–12.5)
Monocytes Relative: 6.6 %
Neutro Abs: 5183 cells/uL (ref 1500–7800)
Neutrophils Relative %: 69.1 %
Platelets: 291 10*3/uL (ref 140–400)
RBC: 5.23 10*6/uL — ABNORMAL HIGH (ref 3.80–5.10)
RDW: 12.1 % (ref 11.0–15.0)
Total Lymphocyte: 22.9 %
WBC: 7.5 10*3/uL (ref 3.8–10.8)

## 2020-07-10 LAB — LIPASE: Lipase: 39 U/L (ref 7–60)

## 2020-07-10 LAB — COMPLETE METABOLIC PANEL WITH GFR
AG Ratio: 1.6 (calc) (ref 1.0–2.5)
ALT: 24 U/L (ref 6–29)
AST: 26 U/L (ref 10–35)
Albumin: 4.1 g/dL (ref 3.6–5.1)
Alkaline phosphatase (APISO): 94 U/L (ref 31–125)
BUN: 13 mg/dL (ref 7–25)
CO2: 26 mmol/L (ref 20–32)
Calcium: 9.2 mg/dL (ref 8.6–10.2)
Chloride: 105 mmol/L (ref 98–110)
Creat: 0.83 mg/dL (ref 0.50–1.10)
GFR, Est African American: 97 mL/min/{1.73_m2} (ref >=60)
GFR, Est Non African American: 84 mL/min/{1.73_m2} (ref >=60)
Globulin: 2.5 g/dL (calc) (ref 1.9–3.7)
Glucose, Bld: 88 mg/dL (ref 65–99)
Potassium: 3.6 mmol/L (ref 3.5–5.3)
Sodium: 141 mmol/L (ref 135–146)
Total Bilirubin: 0.6 mg/dL (ref 0.2–1.2)
Total Protein: 6.6 g/dL (ref 6.1–8.1)

## 2020-07-10 LAB — SACCHAROMYCES CEREVISIAE ANTIBODIES, IGG AND IGA
SACCHAROMYCES CEREVISIAE AB (ASCA)(IGA): 3.2 U (ref <=20.0)
SACCHAROMYCES CEREVISIAE AB (ASCA)(IGG): 2.7 U (ref <=20.0)

## 2020-07-10 LAB — CELIAC DISEASE PANEL
(tTG) Ab, IgA: <1 U/mL
(tTG) Ab, IgG: <1 U/mL
Gliadin IgA: 1.3 U/mL
Gliadin IgG: <1 U/mL
Immunoglobulin A: 160 mg/dL (ref 47–310)

## 2020-07-23 ENCOUNTER — Ambulatory Visit (INDEPENDENT_AMBULATORY_CARE_PROVIDER_SITE_OTHER): Payer: 59 | Admitting: Professional

## 2020-07-23 DIAGNOSIS — F411 Generalized anxiety disorder: Secondary | ICD-10-CM | POA: Diagnosis not present

## 2020-07-28 ENCOUNTER — Ambulatory Visit (INDEPENDENT_AMBULATORY_CARE_PROVIDER_SITE_OTHER): Payer: 59

## 2020-07-28 ENCOUNTER — Encounter: Payer: Self-pay | Admitting: Family Medicine

## 2020-07-28 ENCOUNTER — Telehealth (INDEPENDENT_AMBULATORY_CARE_PROVIDER_SITE_OTHER): Payer: 59 | Admitting: Family Medicine

## 2020-07-28 ENCOUNTER — Other Ambulatory Visit: Payer: Self-pay

## 2020-07-28 DIAGNOSIS — U071 COVID-19: Secondary | ICD-10-CM

## 2020-07-28 DIAGNOSIS — R0602 Shortness of breath: Secondary | ICD-10-CM | POA: Diagnosis not present

## 2020-07-28 MED ORDER — LIDOCAINE VISCOUS HCL 2 % MT SOLN: 5.0000 mL | Freq: Three times a day (TID) | OROMUCOSAL | 0 refills | Status: DC | PRN

## 2020-07-28 NOTE — Patient Instructions (Signed)
Sob

## 2020-07-28 NOTE — Progress Notes (Signed)
Virtual Visit via Video Note  I connected with Megan [name] on 07/28/20 at 11:30 AM EDT by a video enabled telemedicine application and verified that I am speaking with the correct person using two identifiers.   I discussed the limitations of evaluation and management by telemedicine and the availability of in person appointments. The patient expressed understanding and agreed to proceed.  Patient location: at home Provider location: in office  Subjective:    CC: COVID positive  HPI:  Patient reports that Megan [name] started feeling short of breath and coughing on Saturday night.  Her son who had been sick for a week already had actually tested positive for Covid on Friday.  Megan [name] said Megan [name] did a home swab this morning and it also came back positive Megan [name] did run a fever the first night but has not since then.  Megan [name] is concerned because of the shortness of breath and feeling like Megan [name] has some chest discomfort that is radiating into her upper back.  Megan [name] denies any GI symptoms.  Megan [name] has been mostly relying on Advil Cold and Sinus and Mucinex.  Megan [name] did check her home oxygen level through her watch and it was around 95%.  Megan [name] says her throat actually feels extremely sore.  Is painful to swallow and it almost feels a little swollen.  History of high blood pressure and diabetes.  No underlying respiratory or cardiovascular problems.    Past medical history, Surgical history, Family history not pertinant except as noted below, Social history, Allergies, and medications have been entered into the medical record, reviewed, and corrections made.   Review of Systems: No fevers, chills, night sweats, weight loss, chest pain, or shortness of breath.   Objective:    General: Speaking clearly in complete sentences without any shortness of breath.  Alert and oriented x3.  Normal judgment. No apparent acute distress.    Impression and Recommendations:    No problem-specific Assessment & Plan notes found for this  encounter.  COVID-19/SOB - home pulse ox 95%.    Discused options.  We will go ahead and move forward with getting a chest x-ray just to rule out pneumonia we will call with results once available.  Discussed that this is viral and is not treated with antibiotics.  I will call something in for her sore throat to see if we can get her some pain relief.  Otherwise recommend symptomatic care including running humidifier, continue with over-the-counter cough and cold medication getting some rest and hydrating well.   Time spent in encounter 21 minutes  I discussed the assessment and treatment plan with the patient. The patient was provided an opportunity to ask questions and all were answered. The patient agreed with the plan and demonstrated an understanding of the instructions.   The patient was advised to call back or seek an in-person evaluation if the symptoms worsen or if the condition fails to improve as anticipated.   Beatrice Lecher, MD

## 2020-07-28 NOTE — Progress Notes (Signed)
Spoke w/pt she stated that her son tested positive on Friday or Saturday.   She took a home test that was positive this morning.  Her sxs are headache,sinus sxs, unable to swallow, chest congestion. She felt like she had a fever but didn't check her temp she had some sweats and chills. Her watch is reading that she has an elevated HR around 105  Taking advil cold/sinus and mucinex tylenol and IBU.

## 2020-07-29 ENCOUNTER — Ambulatory Visit: Payer: 59 | Admitting: Professional

## 2020-08-06 ENCOUNTER — Ambulatory Visit: Payer: 59 | Admitting: Professional

## 2020-08-12 ENCOUNTER — Ambulatory Visit: Payer: 59 | Admitting: Professional

## 2020-08-20 ENCOUNTER — Ambulatory Visit (INDEPENDENT_AMBULATORY_CARE_PROVIDER_SITE_OTHER): Payer: 59 | Admitting: Professional

## 2020-08-20 DIAGNOSIS — F411 Generalized anxiety disorder: Secondary | ICD-10-CM | POA: Diagnosis not present

## 2020-08-26 ENCOUNTER — Ambulatory Visit: Payer: 59 | Admitting: Professional

## 2020-09-01 ENCOUNTER — Ambulatory Visit: Payer: 59 | Admitting: Professional

## 2020-09-09 ENCOUNTER — Ambulatory Visit: Payer: 59 | Admitting: Professional

## 2020-09-14 ENCOUNTER — Ambulatory Visit (INDEPENDENT_AMBULATORY_CARE_PROVIDER_SITE_OTHER): Payer: Managed Care, Other (non HMO) | Admitting: Professional

## 2020-09-14 DIAGNOSIS — F411 Generalized anxiety disorder: Secondary | ICD-10-CM

## 2020-10-01 ENCOUNTER — Ambulatory Visit: Payer: 59 | Admitting: Professional

## 2020-10-07 ENCOUNTER — Ambulatory Visit (INDEPENDENT_AMBULATORY_CARE_PROVIDER_SITE_OTHER): Payer: 59 | Admitting: Family Medicine

## 2020-10-07 ENCOUNTER — Encounter: Payer: Self-pay | Admitting: Family Medicine

## 2020-10-07 ENCOUNTER — Other Ambulatory Visit: Payer: Self-pay

## 2020-10-07 VITALS — BP 108/56 | HR 84 | Ht 64.0 in | Wt 175.0 lb

## 2020-10-07 DIAGNOSIS — E119 Type 2 diabetes mellitus without complications: Secondary | ICD-10-CM | POA: Diagnosis not present

## 2020-10-07 DIAGNOSIS — Z6830 Body mass index (BMI) 30.0-30.9, adult: Secondary | ICD-10-CM | POA: Diagnosis not present

## 2020-10-07 DIAGNOSIS — I7300 Raynaud's syndrome without gangrene: Secondary | ICD-10-CM

## 2020-10-07 DIAGNOSIS — I10 Essential (primary) hypertension: Secondary | ICD-10-CM

## 2020-10-07 LAB — POCT GLYCOSYLATED HEMOGLOBIN (HGB A1C): HbA1c, POC (prediabetic range): 5.9 % (ref 5.7–6.4)

## 2020-10-07 MED ORDER — EMPAGLIFLOZIN 10 MG PO TABS: 10.0000 mg | ORAL_TABLET | Freq: Every day | ORAL | 1 refills | Status: DC

## 2020-10-07 NOTE — Assessment & Plan Note (Signed)
Work on decreasing the amlodipine but will need to monitor to make sure it does not increase the Raynaud's symptoms.  Right now it summertime so we should be okay.

## 2020-10-07 NOTE — Progress Notes (Signed)
Established Patient Office Visit  Subjective:  Patient ID: Megan [name], female    DOB: [birth year]  Age: 50 y.o. MRN: [id number]  CC:  Chief Complaint  Patient presents with  . Diabetes    Eye exam UTD done @ [facility] in [place]  . Hypertension    HPI Megan [name] presents for   Hypertension- Pt denies chest pain, SOB, dizziness, or heart palpitations.  Taking meds as directed w/o problems.  Denies medication side effects.    Diabetes - no hypoglycemic events. No wounds or sores that are not healing well. No increased thirst or urination. Checking glucose at home. Taking medications as prescribed without any side effects.   She has lost 10 lbs since I last saw her.   She recently moved.  She shares a apartment with her best friend and her 88 year old son.  She still had some issues with her son who still try to Figure out what he wants to do but she has been really trying to just figure out how to let go a little bit.  Just been the 2 of them for a really long time.  Past Medical History:  Diagnosis Date  . Gestational diabetes mellitus in childbirth     Past Surgical History:  Procedure Laterality Date  . TONSILLECTOMY AND ADENOIDECTOMY    . TYMPANOSTOMY TUBE PLACEMENT  1984    Family History  Problem Relation Age of Onset  . Hypertension Mother   . Celiac disease Mother   . Hypertension Father   . Hypertension Sister   . Hypertension Brother   . Peripheral vascular disease Maternal Grandmother   . Diabetes Paternal Aunt   . Diabetes Maternal Uncle   . Diabetes Paternal Aunt   . Colon cancer Maternal Grandfather   . Crohn's disease Cousin     Social History   Socioeconomic History  . Marital status: Single    Spouse name: Yaslene Lindamood  . Number of children: Not on file  . Years of education: Not on file  . Highest education level: Not on file  Occupational History  . Occupation: Asst Public relations account executive    Comment: [employer]   Tobacco Use  . Smoking  status: Never Smoker  . Smokeless tobacco: Never Used  Substance and Sexual Activity  . Alcohol use: No  . Drug use: No  . Sexual activity: Yes    Partners: Male  Other Topics Concern  . Not on file  Social History Narrative   2 caffeine drinks per day. No regular exercise. She works full-time and is going to school.   Social Determinants of Health   Financial Resource Strain: Not on file  Food Insecurity: Not on file  Transportation Needs: Not on file  Physical Activity: Not on file  Stress: Not on file  Social Connections: Not on file  Intimate Partner Violence: Not on file    Outpatient Medications Prior to Visit  Medication Sig Dispense Refill  . AMBULATORY NON FORMULARY MEDICATION One touch ultra 2 glucose system  Use to test once daily as directed  Please schedule f/u appt 1 each 0  . amLODipine (NORVASC) 5 MG tablet Take 1 tablet (5 mg total) by mouth daily. 90 tablet 3  . atorvastatin (LIPITOR) 40 MG tablet Take 1 tablet (40 mg total) by mouth daily. 90 tablet 3  . losartan-hydrochlorothiazide (HYZAAR) 100-12.5 MG tablet Take 1 tablet by mouth daily. 90 tablet 3  . norgestrel-ethinyl estradiol (LO/OVRAL,CRYSELLE) 0.3-30 MG-MCG tablet  Take 1 tablet by mouth daily.    Marland Kitchen OZEMPIC, 1 MG/DOSE, 4 MG/3ML SOPN Inject 1 mg into the skin once a week.    . empagliflozin (JARDIANCE) 10 MG TABS tablet Take 1 tablet (10 mg total) by mouth daily. 90 tablet 1  . Semaglutide, 1 MG/DOSE, (OZEMPIC, 1 MG/DOSE,) 2 MG/1.5ML SOPN INJECT 1MG DOSE INTO THE SKIN EVERY WEEK 18 mL 4  . diclofenac sodium (VOLTAREN) 1 % GEL Apply 2 g topically 4 (four) times daily. To affected joint. 100 g 11  . magic mouthwash (lidocaine, diphenhydrAMINE, alum & mag hydroxide) suspension Swish and spit 5 mLs 3 (three) times daily as needed for mouth pain. Please mix 1:1:1. diphenhydramine 12.5/25m, viscous lidocaine 2%, and maalox 360 mL 0  . metroNIDAZOLE (METROGEL) 1 % gel Apply topically daily.     No  facility-administered medications prior to visit.    Allergies  Allergen Reactions  . Metformin And Related Other (See Comments)    GI upset   . Sertraline Nausea Only    sweats    ROS Review of Systems    Objective:    Physical Exam Constitutional:      Appearance: [name].  HENT:     Head: Normocephalic and atraumatic.  Cardiovascular:     Rate and Rhythm: Normal rate and regular rhythm.     Heart sounds: Normal heart sounds.  Pulmonary:     Effort: Pulmonary effort is normal.     Breath sounds: Normal breath sounds.  Skin:    General: Skin is warm and dry.  Neurological:     Mental Status: She is alert and oriented to person, place, and time.  Psychiatric:        Behavior: Behavior normal.     BP (!) 108/56   Pulse 84   Ht 5' 4"  (1.626 m)   Wt 175 lb (79.4 kg)   SpO2 100%   BMI 30.04 kg/m  Wt Readings from Last 3 Encounters:  10/07/20 175 lb (79.4 kg)  07/07/20 185 lb (83.9 kg)  03/09/20 197 lb (89.4 kg)     Health Maintenance Due  Topic Date Due  . COLONOSCOPY (Pts 45-465yrInsurance coverage will need to be confirmed)  Never done    There are no preventive care reminders to display for this patient.  Lab Results  Component Value Date   TSH 2.00 06/07/2019   Lab Results  Component Value Date   WBC 7.5 07/07/2020   HGB 15.9 (H) 07/07/2020   HCT 46.5 (H) 07/07/2020   MCV 88.9 07/07/2020   PLT 291 07/07/2020   Lab Results  Component Value Date   NA 141 07/07/2020   K 3.6 07/07/2020   CO2 26 07/07/2020   GLUCOSE 88 07/07/2020   BUN 13 07/07/2020   CREATININE 0.83 07/07/2020   BILITOT 0.6 07/07/2020   ALKPHOS 79 06/25/2015   AST 26 07/07/2020   ALT 24 07/07/2020   PROT 6.6 07/07/2020   ALBUMIN 3.9 06/25/2015   CALCIUM 9.2 07/07/2020   Lab Results  Component Value Date   CHOL 135 11/12/2019   Lab Results  Component Value Date   HDL 44 (L) 11/12/2019   Lab Results  Component Value Date   LDLCALC 73 11/12/2019    Lab Results  Component Value Date   TRIG 97 11/12/2019   Lab Results  Component Value Date   CHOLHDL 3.1 11/12/2019   Lab Results  Component Value Date   HGBA1C 5.9 10/07/2020  Assessment & Plan:   Problem List Items Addressed This Visit      Cardiovascular and Mediastinum   Raynaud's disease, idiopathic    Work on decreasing the amlodipine but will need to monitor to make sure it does not increase the Raynaud's symptoms.  Right now it summertime so we should be okay.      Essential hypertension, benign    Please cut amlodipine in half and take half a tab daily.  Monitor for any low blood pressure symptoms such as lightheadedness or dizziness especially with position change otherwise we will follow-up in 3 to 4 months my goal would be to get her off the amlodipine completely.        Endocrine   Diabetes type 2, controlled ([place]) - Primary    A1c looks phenomenal at 5.9 today plan continue current regimen.  Congratulated her on the 10 pound weight loss.  Follow-up in 3 to 4 months at that point if she is doing really well then we might even consider getting rid of the Jardiance.  She is already on the lower dose.      Relevant Medications   OZEMPIC, 1 MG/DOSE, 4 MG/3ML SOPN   empagliflozin (JARDIANCE) 10 MG TABS tablet   Other Relevant Orders   POCT glycosylated hemoglobin (Hb A1C) (Completed)     Other   BMI 30.0-30.9,adult    Is really done a great job with weight loss.  Encouraged her to continue to work on ramping up her exercise she now has some walking trails where she lives.         Meds ordered this encounter  Medications  . empagliflozin (JARDIANCE) 10 MG TABS tablet    Sig: Take 1 tablet (10 mg total) by mouth daily.    Dispense:  90 tablet    Refill:  1    Follow-up: Return in about 4 months (around 02/06/2021) for Diabetes follow-up.    Beatrice Lecher, MD

## 2020-10-07 NOTE — Patient Instructions (Signed)
Cut the amlodipine in half, and take half a tab daily.

## 2020-10-07 NOTE — Assessment & Plan Note (Signed)
Is really done a great job with weight loss.  Encouraged her to continue to work on ramping up her exercise she now has some walking trails where she lives.

## 2020-10-07 NOTE — Assessment & Plan Note (Signed)
Please cut amlodipine in half and take half a tab daily.  Monitor for any low blood pressure symptoms such as lightheadedness or dizziness especially with position change otherwise we will follow-up in 3 to 4 months my goal would be to get her off the amlodipine completely.

## 2020-10-07 NOTE — Assessment & Plan Note (Signed)
A1c looks phenomenal at 5.9 today plan continue current regimen.  Congratulated her on the 10 pound weight loss.  Follow-up in 3 to 4 months at that point if she is doing really well then we might even consider getting rid of the Jardiance.  She is already on the lower dose.

## 2020-10-13 ENCOUNTER — Encounter: Payer: Self-pay | Admitting: Family Medicine

## 2020-10-15 ENCOUNTER — Ambulatory Visit: Payer: Managed Care, Other (non HMO) | Admitting: Professional

## 2020-10-28 LAB — RESULTS CONSOLE HPV: CHL HPV: NEGATIVE

## 2020-10-28 LAB — HM PAP SMEAR: HM Pap smear: NEGATIVE

## 2020-10-29 ENCOUNTER — Ambulatory Visit (INDEPENDENT_AMBULATORY_CARE_PROVIDER_SITE_OTHER): Payer: 59 | Admitting: Professional

## 2020-10-29 DIAGNOSIS — F411 Generalized anxiety disorder: Secondary | ICD-10-CM | POA: Diagnosis not present

## 2020-11-24 ENCOUNTER — Ambulatory Visit: Payer: 59 | Admitting: Professional

## 2020-12-08 ENCOUNTER — Ambulatory Visit: Payer: 59 | Admitting: Professional

## 2020-12-13 ENCOUNTER — Encounter: Payer: Self-pay | Admitting: Family Medicine

## 2020-12-14 ENCOUNTER — Encounter: Payer: Self-pay | Admitting: Family Medicine

## 2020-12-14 ENCOUNTER — Ambulatory Visit (INDEPENDENT_AMBULATORY_CARE_PROVIDER_SITE_OTHER): Payer: 59 | Admitting: Family Medicine

## 2020-12-14 VITALS — BP 122/79 | HR 98 | Temp 98.4°F | Ht 64.0 in | Wt 175.0 lb

## 2020-12-14 DIAGNOSIS — L600 Ingrowing nail: Secondary | ICD-10-CM | POA: Insufficient documentation

## 2020-12-14 NOTE — Progress Notes (Signed)
Megan [name] - 50 y.o. female MRN [id number]  Date of birth: 1971/01/30  Subjective No chief complaint on file.   HPI Megan [name] is a 50 year old female with history of type 2 diabetes.  She has complaint of possible ingrown toenail.  She reports that she dropped something on her big toe last year.  She did lose most of her nail however once this grew back it was more thickened and started to curve inward.  She has noted increased pain around this area especially with pressure.  She has not noted any swelling or drainage around this area.  She does have good sensation in her feet.  ROS:  A comprehensive ROS was completed and negative except as noted per HPI  Allergies  Allergen Reactions   Metformin And Related Other (See Comments)    GI upset    Sertraline Nausea Only    sweats    Past Medical History:  Diagnosis Date   Gestational diabetes mellitus in childbirth     Past Surgical History:  Procedure Laterality Date   TONSILLECTOMY AND ADENOIDECTOMY     TYMPANOSTOMY TUBE PLACEMENT  1984    Social History   Socioeconomic History   Marital status: Single    Spouse name: Setsuko Robins   Number of children: Not on file   Years of education: Not on file   Highest education level: Not on file  Occupational History   Occupation: Asst Public relations account executive    Comment: [employer]   Tobacco Use   Smoking status: Never   Smokeless tobacco: Never  Substance and Sexual Activity   Alcohol use: No   Drug use: No   Sexual activity: Yes    Partners: Male  Other Topics Concern   Not on file  Social History Narrative   2 caffeine drinks per day. No regular exercise. She works full-time and is going to school.   Social Determinants of Health   Financial Resource Strain: Not on file  Food Insecurity: Not on file  Transportation Needs: Not on file  Physical Activity: Not on file  Stress: Not on file  Social Connections: Not on file    Family History  Problem Relation Age of Onset    Hypertension Mother    Celiac disease Mother    Hypertension Father    Hypertension Sister    Hypertension Brother    Peripheral vascular disease Maternal Grandmother    Diabetes Paternal Aunt    Diabetes Maternal Uncle    Diabetes Paternal Aunt    Colon cancer Maternal Grandfather    Crohn's disease Cousin     Health Maintenance  Topic Date Due   COVID-19 Vaccine (1) Never done   COLONOSCOPY (Pts 45-52yr Insurance coverage will need to be confirmed)  Never done   INFLUENZA VACCINE  12/07/2020   Hepatitis C Screening  03/09/2021 (Originally [birth year]/04/1990)   OPHTHALMOLOGY EXAM  02/07/2021   FOOT EXAM  03/09/2021   HEMOGLOBIN A1C  04/08/2021   TETANUS/TDAP  01/29/2022   PAP SMEAR-Modifier  10/04/2022   PNEUMOCOCCAL POLYSACCHARIDE VACCINE AGE 16-64 HIGH RISK  Completed   HIV Screening  Completed   Pneumococcal Vaccine [id number] old  Aged Out   HPV VACCINES  Aged Out     ----------------------------------------------------------------------------------------------------------------------------------------------------------------------------------------------------------------- Physical Exam BP 122/79 (BP Location: Left Arm, Patient Position: Sitting, Cuff Size: Normal)   Pulse 98   Temp 98.4 F (36.9 C)   Ht 5' 4"  (1.626 m)   Wt 175 lb (79.4 kg)  SpO2 97%   BMI 30.04 kg/m   Physical Exam Constitutional:      Appearance: Normal appearance.  HENT:     Head: Normocephalic and atraumatic.  Skin:    Comments: Thickened nail that curves inward towards the medial portion of the toe on both sides.    Neurological:     General: No focal deficit present.     Mental Status: She is alert.    ------------------------------------------------------------------------------------------------------------------------------------------------------------------------------------------------------------------- Assessment and Plan  Ingrown toenail of left foot Discussed that we can  schedule her for partial nail avulsion however I think she may need this along the medial and lateral nail folds.  Alternatively we discussed referral to podiatry with her history of diabetes.  She would feel more comfortable doing this.  Referral placed.   No orders of the defined types were placed in this encounter.   No follow-ups on file.    This visit occurred during the SARS-CoV-2 public health emergency.  Safety protocols were in place, including screening questions prior to the visit, additional usage of staff PPE, and extensive cleaning of exam room while observing appropriate contact time as indicated for disinfecting solutions.

## 2020-12-14 NOTE — Patient Instructions (Signed)
Ingrown Toenail An ingrown toenail occurs when the corner or sides of a toenail grow into the surrounding skin. This causes discomfort and pain. The big toe is most commonly affected, but any of the toes can be affected. If an ingrown toenail is nottreated, it can become infected. What are the causes? This condition may be caused by: Wearing shoes that are too small or tight. An injury, such as stubbing your toe or having your toe stepped on. Improper cutting or care of your toenails. Having nail or foot abnormalities that were present from birth (congenital abnormalities), such as having a nail that is too big for your toe. What increases the risk? The following factors may make you more likely to develop ingrown toenails: Age. Nails tend to get thicker with age, so ingrown nails are more common among older people. Cutting your toenails incorrectly, such as cutting them very short or cutting them unevenly. An ingrown toenail is more likely to get infected if you have: Diabetes. Blood flow (circulation) problems. What are the signs or symptoms? Symptoms of an ingrown toenail may include: Pain, soreness, or tenderness. Redness. Swelling. Hardening of the skin that surrounds the toenail. Signs that an ingrown toenail may be infected include: Fluid or pus. Symptoms that get worse instead of better. How is this diagnosed? An ingrown toenail may be diagnosed based on your medical history, your symptoms, and a physical exam. If you have fluid or blood coming from your toenail, a sample may be collected to test for the specific type of bacteriathat is causing the infection. How is this treated? Treatment depends on how severe your ingrown toenail is. You may be able to care for your toenail at home. If you have an infection, you may be prescribed antibiotic medicines. If you have fluid or pus draining from your toenail, your health care provider may drain it. If you have trouble walking, you  may be given crutches to use. If you have a severe or infected ingrown toenail, you may need a procedure to remove part or all of the nail. Follow these instructions at home: Foot care  Do not pick at your toenail or try to remove it yourself. Soak your foot in warm, soapy water. Do this for 20 minutes, 3 times a day, or as often as told by your health care provider. This helps to keep your toe clean and keep your skin soft. Wear shoes that fit well and are not too tight. Your health care provider may recommend that you wear open-toed shoes while you heal. Trim your toenails regularly and carefully. Cut your toenails straight across to prevent injury to the skin at the corners of the toenail. Do not cut your nails in a curved shape. Keep your feet clean and dry to help prevent infection.  Medicines Take over-the-counter and prescription medicines only as told by your health care provider. If you were prescribed an antibiotic, take it as told by your health care provider. Do not stop taking the antibiotic even if you start to feel better. Activity Return to your normal activities as told by your health care provider. Ask your health care provider what activities are safe for you. Avoid activities that cause pain. General instructions If your health care provider told you to use crutches to help you move around, use them as instructed. Keep all follow-up visits as told by your health care provider. This is important. Contact a health care provider if: You have more redness, swelling, pain, or  other symptoms that do not improve with treatment. You have fluid, blood, or pus coming from your toenail. Get help right away if: You have a red streak on your skin that starts at your foot and spreads up your leg. You have a fever. Summary An ingrown toenail occurs when the corner or sides of a toenail grow into the surrounding skin. This causes discomfort and pain. The big toe is most commonly  affected, but any of the toes can be affected. If an ingrown toenail is not treated, it can become infected. Fluid or pus draining from your toenail is a sign of infection. Your health care provider may need to drain it. You may be given antibiotics to treat the infection. Trimming your toenails regularly and properly can help you prevent an ingrown toenail. This information is not intended to replace advice given to you by your health care provider. Make sure you discuss any questions you have with your healthcare provider. Document Revised: 08/17/2018 Document Reviewed: 01/11/2017 Elsevier Patient Education  2021 [employer].

## 2020-12-14 NOTE — Assessment & Plan Note (Signed)
Discussed that we can schedule her for partial nail avulsion however I think she may need this along the medial and lateral nail folds.  Alternatively we discussed referral to podiatry with her history of diabetes.  She would feel more comfortable doing this.  Referral placed.

## 2020-12-17 ENCOUNTER — Ambulatory Visit: Payer: Self-pay | Admitting: Podiatry

## 2020-12-21 ENCOUNTER — Other Ambulatory Visit: Payer: Self-pay

## 2020-12-21 ENCOUNTER — Ambulatory Visit: Payer: 59 | Admitting: Podiatry

## 2020-12-21 ENCOUNTER — Encounter: Payer: Self-pay | Admitting: Podiatry

## 2020-12-21 DIAGNOSIS — L600 Ingrowing nail: Secondary | ICD-10-CM | POA: Diagnosis not present

## 2020-12-21 DIAGNOSIS — B351 Tinea unguium: Secondary | ICD-10-CM | POA: Diagnosis not present

## 2020-12-22 ENCOUNTER — Ambulatory Visit: Payer: 59 | Admitting: Professional

## 2020-12-23 NOTE — Progress Notes (Signed)
Subjective:   Patient ID: Megan [name], female   DOB: 50 y.o.   MRN: [id number]   HPI 22 presents the office today for concerns of big toenail injury.  She states that nails, previously and grew back.  She states that she has a significantly thickened discolored getting ingrown causing discomfort.  No swelling or redness or any drainage to the toenail sites.  She has no other concerns.  She does have a history of Raynaud's and she is also diabetic.  Last A1c was at 5.7.   Review of Systems  All other systems reviewed and are negative.  Past Medical History:  Diagnosis Date   Gestational diabetes mellitus in childbirth     Past Surgical History:  Procedure Laterality Date   TONSILLECTOMY AND ADENOIDECTOMY     TYMPANOSTOMY TUBE PLACEMENT  1984     Current Outpatient Medications:    AMBULATORY NON FORMULARY MEDICATION, One touch ultra 2 glucose system  Use to test once daily as directed  Please schedule f/u appt, Disp: 1 each, Rfl: 0   amLODipine (NORVASC) 5 MG tablet, Take 1 tablet (5 mg total) by mouth daily. (Patient taking differently: Take 2.5 mg by mouth daily.), Disp: 90 tablet, Rfl: 3   atorvastatin (LIPITOR) 40 MG tablet, Take 1 tablet (40 mg total) by mouth daily., Disp: 90 tablet, Rfl: 3   empagliflozin (JARDIANCE) 10 MG TABS tablet, Take 1 tablet (10 mg total) by mouth daily., Disp: 90 tablet, Rfl: 1   losartan-hydrochlorothiazide (HYZAAR) 100-12.5 MG tablet, Take 1 tablet by mouth daily., Disp: 90 tablet, Rfl: 3   norgestrel-ethinyl estradiol (LO/OVRAL,CRYSELLE) 0.3-30 MG-MCG tablet, Take 1 tablet by mouth daily., Disp: , Rfl:    OZEMPIC, 1 MG/DOSE, 4 MG/3ML SOPN, Inject 1 mg into the skin once a week., Disp: , Rfl:   Allergies  Allergen Reactions   Metformin And Related Other (See Comments)    GI upset    Sertraline Nausea Only    sweats          Objective:  Physical Exam  General: AAO x3, NAD  Dermatological: The left hallux toenails hypertrophic,  dystrophic with mild yellow discoloration.  There is incurvation of the nail on both medial and lateral nail borders.  There is no edema, erythema or signs of infection.  Mild tenderness to palpation of the nail.  No open lesions.  Vascular: Dorsalis Pedis artery and Posterior Tibial artery pedal pulses are 2/4 bilateral with immedate capillary fill time. There is no pain with calf compression, swelling, warmth, erythema.   Neruologic: Grossly intact via light touch bilateral.   Musculoskeletal: No gross boney pedal deformities bilateral. No pain, crepitus, or limitation noted with foot and ankle range of motion bilateral. Muscular strength 5/5 in all groups tested bilateral.  Gait: Unassisted, Nonantalgic.       Assessment:   Right hallux onychodystrophy, ingrown toenail    Plan:  -Treatment options discussed including all alternatives, risks, and complications -Etiology of symptoms were discussed -We discussed both conservative as well surgical options for the toenail.  Given her history of Raynaud's as well as diabetes she has never sought remove the nail and I agree.  I did sharply debride the nail and sent this for culture, pathology is significant to the toenail itself and hopefully can grow out better.  Discussed regular debridements of the toenail.  Monitor closely for any signs or symptoms of infection.  Trula Slade DPM

## 2021-01-07 ENCOUNTER — Encounter: Payer: Self-pay | Admitting: Podiatry

## 2021-02-08 ENCOUNTER — Other Ambulatory Visit: Payer: Self-pay

## 2021-02-08 ENCOUNTER — Ambulatory Visit: Payer: 59 | Admitting: Family Medicine

## 2021-02-08 ENCOUNTER — Encounter: Payer: Self-pay | Admitting: Family Medicine

## 2021-02-08 VITALS — BP 114/70 | HR 81 | Temp 98.2°F | Ht 64.0 in | Wt 172.0 lb

## 2021-02-08 DIAGNOSIS — E7800 Pure hypercholesterolemia, unspecified: Secondary | ICD-10-CM | POA: Diagnosis not present

## 2021-02-08 DIAGNOSIS — E119 Type 2 diabetes mellitus without complications: Secondary | ICD-10-CM

## 2021-02-08 DIAGNOSIS — Z1211 Encounter for screening for malignant neoplasm of colon: Secondary | ICD-10-CM | POA: Diagnosis not present

## 2021-02-08 DIAGNOSIS — T753XXA Motion sickness, initial encounter: Secondary | ICD-10-CM

## 2021-02-08 DIAGNOSIS — I10 Essential (primary) hypertension: Secondary | ICD-10-CM

## 2021-02-08 DIAGNOSIS — I7300 Raynaud's syndrome without gangrene: Secondary | ICD-10-CM

## 2021-02-08 LAB — POCT GLYCOSYLATED HEMOGLOBIN (HGB A1C): Hemoglobin A1C: 5 % (ref 4.0–5.6)

## 2021-02-08 MED ORDER — AMLODIPINE BESYLATE 5 MG PO TABS: 5.0000 mg | ORAL_TABLET | Freq: Every day | ORAL | 3 refills | Status: DC

## 2021-02-08 MED ORDER — SCOPOLAMINE 1 MG/3DAYS TD PT72: 1.0000 | MEDICATED_PATCH | TRANSDERMAL | 0 refills | Status: DC

## 2021-02-08 NOTE — Assessment & Plan Note (Signed)
A1c looks phenomenal today at 5.0.  We will discontinue the Januvia for now.  Plan to follow-up in 4 months.

## 2021-02-08 NOTE — Progress Notes (Signed)
Established Patient Office Visit  Subjective:  Patient ID: Megan [name], female    DOB: 22-May-1970  Age: 50 y.o. MRN: [id number]  CC:  Chief Complaint  Patient presents with   Diabetes   Hypertension   Hyperlipidemia    HPI Megan [name] presents for   Hypertension- Pt denies chest pain, SOB, dizziness, or heart palpitations.  Taking meds as directed w/o problems.  Denies medication side effects.  She is taking the lisinopril HCT and half a tab of the 10 mg amlodipine.  Diabetes - no hypoglycemic events. No wounds or sores that are not healing well. No increased thirst or urination. Checking glucose at home. Taking medications as prescribed without any side effects.  Hyperlipidemia - tolerating stating well with no myalgias or significant side effects.  Lab Results  Component Value Date   CHOL 135 11/12/2019   HDL 44 (L) 11/12/2019   LDLCALC 73 11/12/2019   TRIG 97 11/12/2019   CHOLHDL 3.1 11/12/2019    She is also going to going on a cruise at the end of October and she does get seasick.  She has picked up some over-the-counter products but wants to know if she can do something prescription as well just in case she needs it.   Past Medical History:  Diagnosis Date   Gestational diabetes mellitus in childbirth     Past Surgical History:  Procedure Laterality Date   TONSILLECTOMY AND ADENOIDECTOMY     TYMPANOSTOMY TUBE PLACEMENT  1984    Family History  Problem Relation Age of Onset   Hypertension Mother    Celiac disease Mother    Hypertension Father    Hypertension Sister    Hypertension Brother    Peripheral vascular disease Maternal Grandmother    Diabetes Paternal Aunt    Diabetes Maternal Uncle    Diabetes Paternal Aunt    Colon cancer Maternal Grandfather    Crohn's disease Cousin     Social History   Socioeconomic History   Marital status: Single    Spouse name: Megan [name]   Number of children: Not on file   Years of education: Not on file    Highest education level: Not on file  Occupational History   Occupation: Asst Public relations account executive    Comment: [employer]   Tobacco Use   Smoking status: Never   Smokeless tobacco: Never  Substance and Sexual Activity   Alcohol use: No   Drug use: No   Sexual activity: Yes    Partners: Male  Other Topics Concern   Not on file  Social History Narrative   2 caffeine drinks per day. No regular exercise. She works full-time and is going to school.   Social Determinants of Health   Financial Resource Strain: Not on file  Food Insecurity: Not on file  Transportation Needs: Not on file  Physical Activity: Not on file  Stress: Not on file  Social Connections: Not on file  Intimate Partner Violence: Not on file    Outpatient Medications Prior to Visit  Medication Sig Dispense Refill   AMBULATORY NON FORMULARY MEDICATION One touch ultra 2 glucose system  Use to test once daily as directed  Please schedule f/u appt 1 each 0   atorvastatin (LIPITOR) 40 MG tablet Take 1 tablet (40 mg total) by mouth daily. 90 tablet 3   losartan-hydrochlorothiazide (HYZAAR) 100-12.5 MG tablet Take 1 tablet by mouth daily. 90 tablet 3   norgestrel-ethinyl estradiol (LO/OVRAL,CRYSELLE) 0.3-30 MG-MCG tablet Take  1 tablet by mouth daily.     OZEMPIC, 1 MG/DOSE, 4 MG/3ML SOPN Inject 1 mg into the skin once a week.     amLODipine (NORVASC) 5 MG tablet Take 1 tablet (5 mg total) by mouth daily. (Patient taking differently: Take 2.5 mg by mouth daily.) 90 tablet 3   empagliflozin (JARDIANCE) 10 MG TABS tablet Take 1 tablet (10 mg total) by mouth daily. 90 tablet 1   No facility-administered medications prior to visit.    Allergies  Allergen Reactions   Metformin And Related Other (See Comments)    GI upset    Sertraline Nausea Only    sweats    ROS Review of Systems    Objective:    Physical Exam Constitutional:      Appearance: Normal appearance. [name].  HENT:     Head:  Normocephalic and atraumatic.  Cardiovascular:     Rate and Rhythm: Normal rate and regular rhythm.     Heart sounds: Normal heart sounds.  Pulmonary:     Effort: Pulmonary effort is normal.     Breath sounds: Normal breath sounds.  Skin:    General: Skin is warm and dry.  Neurological:     Mental Status: She is alert and oriented to person, place, and time.  Psychiatric:        Behavior: Behavior normal.    BP 114/70   Pulse 81   Temp 98.2 F (36.8 C)   Ht 5' 4"  (1.626 m)   Wt 172 lb (78 kg)   SpO2 100%   BMI 29.52 kg/m  Wt Readings from Last 3 Encounters:  02/08/21 172 lb (78 kg)  12/14/20 175 lb (79.4 kg)  10/07/20 175 lb (79.4 kg)     Health Maintenance Due  Topic Date Due   COLONOSCOPY (Pts 45-89yr Insurance coverage will need to be confirmed)  Never done   OPHTHALMOLOGY EXAM  02/07/2021    There are no preventive care reminders to display for this patient.  Lab Results  Component Value Date   TSH 2.00 06/07/2019   Lab Results  Component Value Date   WBC 7.5 07/07/2020   HGB 15.9 (H) 07/07/2020   HCT 46.5 (H) 07/07/2020   MCV 88.9 07/07/2020   PLT 291 07/07/2020   Lab Results  Component Value Date   NA 141 07/07/2020   K 3.6 07/07/2020   CO2 26 07/07/2020   GLUCOSE 88 07/07/2020   BUN 13 07/07/2020   CREATININE 0.83 07/07/2020   BILITOT 0.6 07/07/2020   ALKPHOS 79 06/25/2015   AST 26 07/07/2020   ALT 24 07/07/2020   PROT 6.6 07/07/2020   ALBUMIN 3.9 06/25/2015   CALCIUM 9.2 07/07/2020   Lab Results  Component Value Date   CHOL 135 11/12/2019   Lab Results  Component Value Date   HDL 44 (L) 11/12/2019   Lab Results  Component Value Date   LDLCALC 73 11/12/2019   Lab Results  Component Value Date   TRIG 97 11/12/2019   Lab Results  Component Value Date   CHOLHDL 3.1 11/12/2019   Lab Results  Component Value Date   HGBA1C 5.0 02/08/2021      Assessment & Plan:   Problem List Items Addressed This Visit        Cardiovascular and Mediastinum   Raynaud's disease, idiopathic   Relevant Medications   amLODipine (NORVASC) 5 MG tablet   Essential hypertension, benign    Doing really well on half a  tab of amlodipine.  She said it did aggravate her Raynaud's a little bit but she has been dealing with it and would like to stay with the 5 mg.      Relevant Medications   amLODipine (NORVASC) 5 MG tablet   Other Relevant Orders   Lipid Panel w/reflex Direct LDL   COMPLETE METABOLIC PANEL WITH GFR   CBC     Endocrine   Diabetes type 2, controlled ([place]) - Primary    A1c looks phenomenal today at 5.0.  We will discontinue the Januvia for now.  Plan to follow-up in 4 months.      Relevant Orders   POCT glycosylated hemoglobin (Hb A1C) (Completed)   Lipid Panel w/reflex Direct LDL   COMPLETE METABOLIC PANEL WITH GFR   CBC     Other   Hyperlipidemia   Relevant Medications   amLODipine (NORVASC) 5 MG tablet   Other Relevant Orders   Lipid Panel w/reflex Direct LDL   Other Visit Diagnoses     Screen for colon cancer       Relevant Orders   Ambulatory referral to Gastroenterology   Sea sickness, initial encounter       Relevant Medications   scopolamine (TRANSDERM-SCOP, 1.5 MG,) 1 MG/3DAYS       Meds ordered this encounter  Medications   amLODipine (NORVASC) 5 MG tablet    Sig: Take 1 tablet (5 mg total) by mouth daily.    Dispense:  90 tablet    Refill:  3   scopolamine (TRANSDERM-SCOP, 1.5 MG,) 1 MG/3DAYS    Sig: Place 1 patch (1.5 mg total) onto the skin every 3 (three) days.    Dispense:  4 patch    Refill:  0     Follow-up: Return in about 4 months (around 06/11/2021) for Diabetes follow-up.    Beatrice Lecher, MD

## 2021-02-08 NOTE — Assessment & Plan Note (Signed)
Doing really well on half a tab of amlodipine.  She said it did aggravate her Raynaud's a little bit but she has been dealing with it and would like to stay with the 5 mg.

## 2021-02-09 LAB — COMPLETE METABOLIC PANEL WITH GFR
AG Ratio: 1.8 (calc) (ref 1.0–2.5)
ALT: 18 U/L (ref 6–29)
AST: 16 U/L (ref 10–35)
Albumin: 4 g/dL (ref 3.6–5.1)
Alkaline phosphatase (APISO): 91 U/L (ref 31–125)
BUN: 11 mg/dL (ref 7–25)
CO2: 27 mmol/L (ref 20–32)
Calcium: 9.1 mg/dL (ref 8.6–10.2)
Chloride: 108 mmol/L (ref 98–110)
Creat: 0.8 mg/dL (ref 0.50–0.99)
Globulin: 2.2 g/dL (calc) (ref 1.9–3.7)
Glucose, Bld: 80 mg/dL (ref 65–99)
Potassium: 4.3 mmol/L (ref 3.5–5.3)
Sodium: 144 mmol/L (ref 135–146)
Total Bilirubin: 0.4 mg/dL (ref 0.2–1.2)
Total Protein: 6.2 g/dL (ref 6.1–8.1)
eGFR: 91 mL/min/{1.73_m2} (ref >=60)

## 2021-02-09 LAB — CBC
HCT: 45.5 % — ABNORMAL HIGH (ref 35.0–45.0)
Hemoglobin: 15 g/dL (ref 11.7–15.5)
MCH: 30.1 pg (ref 27.0–33.0)
MCHC: 33 g/dL (ref 32.0–36.0)
MCV: 91.4 fL (ref 80.0–100.0)
MPV: 9.8 fL (ref 7.5–12.5)
Platelets: 249 10*3/uL (ref 140–400)
RBC: 4.98 10*6/uL (ref 3.80–5.10)
RDW: 12 % (ref 11.0–15.0)
WBC: 6.6 10*3/uL (ref 3.8–10.8)

## 2021-02-09 LAB — LIPID PANEL W/REFLEX DIRECT LDL
Cholesterol: 149 mg/dL (ref <200)
HDL: 50 mg/dL (ref >=50)
LDL Cholesterol (Calc): 83 mg/dL (calc)
Non-HDL Cholesterol (Calc): 99 mg/dL (calc) (ref <130)
Total CHOL/HDL Ratio: 3 (calc) (ref <5.0)
Triglycerides: 79 mg/dL (ref <150)

## 2021-02-10 NOTE — Progress Notes (Signed)
Hi Sury, cholesterol levels look great.  Your metabolic panel including liver and kidney function look great.  No anemia.

## 2021-02-12 ENCOUNTER — Telehealth: Payer: Self-pay | Admitting: Family Medicine

## 2021-02-12 NOTE — Telephone Encounter (Signed)
Office currently closed. Sent fax requesting documentation of PAP & HPV testing.

## 2021-02-12 NOTE — Telephone Encounter (Signed)
Pls call Megan [name] for last pap and hpv test.  For some reason the scanned document is incomplete so I cannot tell if she has had HPV testing or not.

## 2021-02-16 ENCOUNTER — Encounter: Payer: Self-pay | Admitting: Family Medicine

## 2021-03-22 ENCOUNTER — Encounter: Payer: Self-pay | Admitting: Family Medicine

## 2021-03-24 ENCOUNTER — Ambulatory Visit: Payer: 59 | Admitting: Sports Medicine

## 2021-03-24 ENCOUNTER — Other Ambulatory Visit: Payer: Self-pay

## 2021-03-24 DIAGNOSIS — M1712 Unilateral primary osteoarthritis, left knee: Secondary | ICD-10-CM

## 2021-03-24 MED ORDER — MELOXICAM 15 MG PO TABS
ORAL_TABLET | ORAL | 3 refills | Status: DC
Start: 2021-03-24 — End: 2022-04-26

## 2021-03-24 MED ORDER — MELOXICAM 15 MG PO TABS: ORAL_TABLET | ORAL | 3 refills | Status: DC

## 2021-03-24 NOTE — Assessment & Plan Note (Signed)
Megan [name] is a very pleasant 50 year old female, she last had a knee injection in March 2021, she has done really well with weight loss, over 50 pounds. She has started to have some increasing pain in the left knee, exam is for the most part unremarkable. Adding meloxicam, she will continue her weight loss journey with Dr. Madilyn Fireman. Adding some knee conditioning exercises, return to see me a week or 2 for repeat injection if not better.

## 2021-03-24 NOTE — Progress Notes (Signed)
    Procedures performed today:    None.  Independent interpretation of notes and tests performed by another provider:   None.  Brief History, Exam, Impression, and Recommendations:    Primary osteoarthritis of left knee Megan [name] is a very pleasant 50 year old female, she last had a knee injection in March 2021, she has done really well with weight loss, over 50 pounds. She has started to have some increasing pain in the left knee, exam is for the most part unremarkable. Adding meloxicam, she will continue her weight loss journey with Dr. Madilyn Fireman. Adding some knee conditioning exercises, return to see me a week or 2 for repeat injection if not better.    ___________________________________________ Gwen Her. Dianah Field, M.D., ABFM., CAQSM. Primary Care and [place] Instructor of [place] [facility]

## 2021-03-24 NOTE — Addendum Note (Signed)
Addended by: Andria Rhein L on: 03/24/2021 11:59 AM   Modules accepted: Orders

## 2021-04-04 ENCOUNTER — Encounter: Payer: Self-pay | Admitting: Podiatry

## 2021-04-08 ENCOUNTER — Encounter: Payer: Self-pay | Admitting: Podiatry

## 2021-04-08 ENCOUNTER — Other Ambulatory Visit: Payer: Self-pay

## 2021-04-08 ENCOUNTER — Ambulatory Visit: Payer: 59 | Admitting: Podiatry

## 2021-04-08 DIAGNOSIS — L600 Ingrowing nail: Secondary | ICD-10-CM

## 2021-04-08 NOTE — Progress Notes (Signed)
  Subjective:  Patient ID: Megan [name], female    DOB: 11-01-1970,   MRN: [id number]  No chief complaint on file.   50 y.o. female presents for concern of ongoing pain in left great toe from ingrown toenail. She has seen Dr. Jacqualyn Posey previously for this problem. She has had them trimmed back in a slant back fashion but has continued to have pain. She does have a history of diabetes Last A1c was 5. She also has a history of Raynauds so has been hesitant to do a procedure on the toes. Today she relates the pain has worsened and unable to walk regularly like she has so would like to consider a procedure.  Denies any other pedal complaints. Denies n/v/f/c.   Past Medical History:  Diagnosis Date   Gestational diabetes mellitus in childbirth     Objective:  Physical Exam: Vascular: DP/PT pulses 2/4 bilateral. CFT <3 seconds. Normal hair growth on digits. No edema. Mild purple duskiness noted to toes worsening after taking her shoes off.  Skin. No lacerations or abrasions bilateral feet. Thickened left hallux toenail with incurvation of the medial and lateral nail border. No erythema edema or purulence noted.  Musculoskeletal: MMT 5/5 bilateral lower extremities in DF, PF, Inversion and Eversion. Deceased ROM in DF of ankle joint.  Neurological: Sensation intact to light touch.   Assessment:   1. Ingrown nail of great toe of left foot      Plan:  Patient was evaluated and treated and all questions answered. Patient requesting removal of ingrown nail today. Procedure below.  Discussed procedure and post procedure care and patient expressed understanding.  Will follow-up in 2 weeks for nail check or sooner if any problems arise.    Procedure:  Procedure: partial Nail Avulsion of left hallux bilateral nail border.  Surgeon: Lorenda Peck, DPM  Pre-op Dx: Ingrown toenail without infection Post-op: Same  Place of Surgery: Office exam room.  Indications for surgery: Painful and ingrown  toenail.    The patient is requesting removal of nail with chemical matrixectomy. Risks and complications were discussed with the patient for which they understand and written consent was obtained. Under sterile conditions a total of 3 mL of 1:1 mixture 0.5% marcaine plain and 1% lidocaine plain was infiltrated in a hallux block fashion. Once anesthetized, the skin was prepped in sterile fashion. A tourniquet was then applied. Next the bilateral aspect of hallux nail border was then sharply excised making sure to remove the entire offending nail border.  Next phenol was then applied under standard conditions and copiously irrigated. Silvadene was applied. A dry sterile dressing was applied. After application of the dressing the tourniquet was removed and there is found to be an immediate capillary refill time to the digit. The patient tolerated the procedure well without any complications. Post procedure instructions were discussed the patient for which he verbally understood. Follow-up in two weeks for nail check or sooner if any problems are to arise. Discussed signs/symptoms of infection and directed to call the office immediately should any occur or go directly to the emergency room. In the meantime, encouraged to call the office with any questions, concerns, changes symptoms.   Lorenda Peck, DPM

## 2021-04-08 NOTE — Patient Instructions (Signed)
Soak Instructions    THE DAY AFTER THE PROCEDURE  Place 1/4 cup of epsom salts (or betadine, or white vinegar) in a quart of warm tap water.  Submerge your foot or feet with outer bandage intact for the initial soak; this will allow the bandage to become moist and wet for easy lift off.  Once you remove your bandage, continue to soak in the solution for 20 minutes.  This soak should be done twice a day.  Next, remove your foot or feet from solution, blot dry the affected area and cover.  You may use a band aid large enough to cover the area or use gauze and tape.  Apply other medications to the area as directed by the doctor such as polysporin neosporin.  IF YOUR SKIN BECOMES IRRITATED WHILE USING THESE INSTRUCTIONS, IT IS OKAY TO SWITCH TO  WHITE VINEGAR AND WATER. Or you may use antibacterial soap and water to keep the toe clean  Monitor for any signs/symptoms of infection. Call the office immediately if any occur or go directly to the emergency room. Call with any questions/concerns.    [facility] Instructions-Post Nail Surgery  You have had your ingrown toenail and root treated with a chemical.  This chemical causes a burn that will drain and ooze like a blister.  This can drain for 6-8 weeks or longer.  It is important to keep this area clean, covered, and follow the soaking instructions dispensed at the time of your surgery.  This area will eventually dry and form a scab.  Once the scab forms you no longer need to soak or apply a dressing.  If at any time you experience an increase in pain, redness, swelling, or drainage, you should contact the office as soon as possible.

## 2021-04-12 ENCOUNTER — Encounter: Payer: Self-pay | Admitting: Podiatry

## 2021-04-20 ENCOUNTER — Other Ambulatory Visit: Payer: Self-pay | Admitting: Family Medicine

## 2021-04-20 DIAGNOSIS — E119 Type 2 diabetes mellitus without complications: Secondary | ICD-10-CM

## 2021-04-22 ENCOUNTER — Ambulatory Visit: Payer: 59 | Admitting: Podiatry

## 2021-04-22 ENCOUNTER — Encounter: Payer: Self-pay | Admitting: Podiatry

## 2021-04-22 ENCOUNTER — Other Ambulatory Visit: Payer: Self-pay

## 2021-04-22 DIAGNOSIS — L600 Ingrowing nail: Secondary | ICD-10-CM

## 2021-04-22 NOTE — Progress Notes (Signed)
°  Subjective:  Patient ID: ILLYANA SCHORSCH, female    DOB: 04-Mar-1971,   MRN: [id number]  No chief complaint on file.   50 y.o. female presents for follow-up of left ingrown nail procedure. Relates she is doing well. No pain. Has been soaking and using the neosporin . Denies any other pedal complaints. Denies n/v/f/c.   Past Medical History:  Diagnosis Date   Gestational diabetes mellitus in childbirth     Objective:  Physical Exam: Vascular: DP/PT pulses 2/4 bilateral. CFT <3 seconds. Normal hair growth on digits. No edema.  Skin. No lacerations or abrasions bilateral feet. Left hallux nail borders well healing. No erythema edema or purulence noted.  Musculoskeletal: MMT 5/5 bilateral lower extremities in DF, PF, Inversion and Eversion. Deceased ROM in DF of ankle joint.  Neurological: Sensation intact to light touch.   Assessment:   1. Ingrown nail of great toe of left foot      Plan:  Patient was evaluated and treated and all questions answered. Toe was evaluated and appears to be healing well.  May discontinue soaks and neosporin.  Patient to follow-up as needed.    Lorenda Peck, DPM

## 2021-04-25 ENCOUNTER — Other Ambulatory Visit: Payer: Self-pay | Admitting: Family Medicine

## 2021-04-30 ENCOUNTER — Other Ambulatory Visit: Payer: Self-pay | Admitting: *Deleted

## 2021-04-30 DIAGNOSIS — I10 Essential (primary) hypertension: Secondary | ICD-10-CM

## 2021-04-30 MED ORDER — AMLODIPINE BESYLATE 5 MG PO TABS: 5.0000 mg | ORAL_TABLET | Freq: Every day | ORAL | 3 refills | Status: DC

## 2021-04-30 MED ORDER — ATORVASTATIN CALCIUM 40 MG PO TABS: 40.0000 mg | ORAL_TABLET | Freq: Every day | ORAL | 3 refills | Status: AC

## 2021-04-30 MED ORDER — LOSARTAN POTASSIUM-HCTZ 100-12.5 MG PO TABS: 1.0000 | ORAL_TABLET | Freq: Every day | ORAL | 3 refills | Status: DC

## 2021-05-07 ENCOUNTER — Other Ambulatory Visit: Payer: Self-pay | Admitting: Podiatry

## 2021-05-07 ENCOUNTER — Encounter: Payer: Self-pay | Admitting: Podiatry

## 2021-05-07 ENCOUNTER — Encounter: Payer: Self-pay | Admitting: Family Medicine

## 2021-05-07 MED ORDER — CEPHALEXIN 500 MG PO CAPS: 500.0000 mg | ORAL_CAPSULE | Freq: Four times a day (QID) | ORAL | 0 refills | Status: DC

## 2021-05-07 NOTE — Telephone Encounter (Signed)
Please advise 

## 2021-05-11 ENCOUNTER — Other Ambulatory Visit: Payer: Self-pay | Admitting: Podiatry

## 2021-05-11 MED ORDER — CEPHALEXIN 500 MG PO CAPS: 500.0000 mg | ORAL_CAPSULE | Freq: Four times a day (QID) | ORAL | 0 refills | Status: AC

## 2021-05-11 NOTE — Telephone Encounter (Signed)
Please advise 

## 2021-06-14 ENCOUNTER — Ambulatory Visit (INDEPENDENT_AMBULATORY_CARE_PROVIDER_SITE_OTHER): Payer: 59 | Admitting: Family Medicine

## 2021-06-14 ENCOUNTER — Other Ambulatory Visit: Payer: Self-pay

## 2021-06-14 ENCOUNTER — Encounter: Payer: Self-pay | Admitting: Family Medicine

## 2021-06-14 VITALS — BP 124/68 | HR 57 | Resp 16 | Ht 64.0 in | Wt 176.0 lb

## 2021-06-14 DIAGNOSIS — Z6830 Body mass index (BMI) 30.0-30.9, adult: Secondary | ICD-10-CM

## 2021-06-14 DIAGNOSIS — I7300 Raynaud's syndrome without gangrene: Secondary | ICD-10-CM | POA: Diagnosis not present

## 2021-06-14 DIAGNOSIS — E119 Type 2 diabetes mellitus without complications: Secondary | ICD-10-CM

## 2021-06-14 DIAGNOSIS — I10 Essential (primary) hypertension: Secondary | ICD-10-CM

## 2021-06-14 LAB — POCT GLYCOSYLATED HEMOGLOBIN (HGB A1C): Hemoglobin A1C: 5.1 % (ref 4.0–5.6)

## 2021-06-14 MED ORDER — AMLODIPINE BESYLATE 5 MG PO TABS: 2.5000 mg | ORAL_TABLET | Freq: Every day | ORAL | 0 refills | Status: DC

## 2021-06-14 MED ORDER — LOSARTAN POTASSIUM-HCTZ 50-12.5 MG PO TABS: 1.0000 | ORAL_TABLET | Freq: Every day | ORAL | 1 refills | Status: DC

## 2021-06-14 MED ORDER — SEMAGLUTIDE (2 MG/DOSE) 8 MG/3ML ~~LOC~~ SOPN: 2.0000 mg | PEN_INJECTOR | SUBCUTANEOUS | 1 refills | Status: DC

## 2021-06-14 NOTE — Assessment & Plan Note (Signed)
He has done really well overall but feels like she is struggling a little bit more recently and her weight has crept back up slightly.  Discussed increasing her Ozempic to 2 mg as above.

## 2021-06-14 NOTE — Progress Notes (Signed)
Patient has eye exam appointment scheduled in 07/2021 at [facility].   Patient stated she will call to schedule Colonoscopy.

## 2021-06-14 NOTE — Assessment & Plan Note (Signed)
Continue 2.5 mg daily and symptomatic care.

## 2021-06-14 NOTE — Assessment & Plan Note (Addendum)
Well controlled.  In fact blood pressures have been running a little bit on the lower side.  Though this morning it is up a little bit.  We had discussed previously decreasing her medication.  We will decrease the losartan component of her medication.  And keep the amlodipine since she does have Raynaud's and it is helpful.. Follow up in  6 mo

## 2021-06-14 NOTE — Assessment & Plan Note (Signed)
A1c looks absolutely fantastic today she is doing really well.  She says she is still struggling a little bit with weight loss she feels like its been bouncing upward a little bit more ever since she came off of the Jardiance.  We did discuss going up to 2 mg on the Ozempic to see if she feels like that helps a little bit she feels like she really has been sticking to a healthy diet she has been walking 2 miles a day at lunch and sometimes even more in the evenings.  Lab Results  Component Value Date   HGBA1C 5.1 06/14/2021
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
## 2021-06-24 ENCOUNTER — Encounter: Payer: Self-pay | Admitting: Sports Medicine

## 2021-06-28 ENCOUNTER — Ambulatory Visit: Payer: 59 | Admitting: Sports Medicine

## 2021-06-29 ENCOUNTER — Ambulatory Visit: Payer: 59 | Admitting: Sports Medicine

## 2021-07-01 ENCOUNTER — Other Ambulatory Visit: Payer: Self-pay | Admitting: Family Medicine

## 2021-07-01 DIAGNOSIS — E119 Type 2 diabetes mellitus without complications: Secondary | ICD-10-CM

## 2021-07-01 NOTE — Telephone Encounter (Signed)
[facility] requesting med refill for jardiance. Rx not listed in active med list.

## 2021-07-02 ENCOUNTER — Ambulatory Visit: Payer: 59 | Admitting: Sports Medicine

## 2021-07-19 ENCOUNTER — Other Ambulatory Visit: Payer: Self-pay | Admitting: Family Medicine

## 2021-07-19 DIAGNOSIS — E119 Type 2 diabetes mellitus without complications: Secondary | ICD-10-CM

## 2021-07-20 ENCOUNTER — Telehealth: Payer: Self-pay

## 2021-07-20 NOTE — Telephone Encounter (Signed)
[id number] ?Ozempic (2 MG/DOSE) 8MG/3ML pen-injectors ?Initiated Prior authorization for: ?Via: Covermymeds ?Case/Key:[id number] ?Status: approved as of 07/20/21 ?Reason:approved through 07/21/2022. Your patient may now fill this prescription and it will be covered. ?Notified Pt via: Mychart ?

## 2021-07-24 LAB — HM DIABETES EYE EXAM

## 2021-07-26 ENCOUNTER — Other Ambulatory Visit: Payer: Self-pay

## 2021-07-26 ENCOUNTER — Ambulatory Visit: Payer: 59 | Admitting: Sports Medicine

## 2021-07-26 ENCOUNTER — Ambulatory Visit (INDEPENDENT_AMBULATORY_CARE_PROVIDER_SITE_OTHER): Payer: 59

## 2021-07-26 DIAGNOSIS — M1712 Unilateral primary osteoarthritis, left knee: Secondary | ICD-10-CM

## 2021-07-26 DIAGNOSIS — E119 Type 2 diabetes mellitus without complications: Secondary | ICD-10-CM

## 2021-07-26 MED ORDER — SEMAGLUTIDE (2 MG/DOSE) 8 MG/3ML ~~LOC~~ SOPN: 2.0000 mg | PEN_INJECTOR | SUBCUTANEOUS | 2 refills | Status: DC

## 2021-07-26 NOTE — Assessment & Plan Note (Signed)
Pleasant 51 year old female, left knee osteoarthritis, last injected March 2021, 50 pound weight loss. ?Persistent/worsening pain, injected it, return to see me 6 weeks as needed. ?

## 2021-07-26 NOTE — Progress Notes (Signed)
? ? ?  Procedures performed today:   ? ?Procedure: Real-time Ultrasound Guided injection of the left knee ?Device: Samsung HS60  ?Verbal informed consent obtained.  ?Time-out conducted.  ?Noted no overlying erythema, induration, or other signs of local infection.  ?Skin prepped in a sterile fashion.  ?Local anesthesia: Topical Ethyl chloride.  ?With sterile technique and under real time ultrasound guidance: Trace effusion noted, 1 cc Kenalog 40, 2 cc lidocaine, 2 cc bupivacaine injected easily ?Completed without difficulty  ?Advised to call if fevers/chills, erythema, induration, drainage, or persistent bleeding.  ?Images permanently stored and available for review in PACS.  ?Impression: Technically successful ultrasound guided injection. ? ?Independent interpretation of notes and tests performed by another provider:  ? ?None. ? ?Brief History, Exam, Impression, and Recommendations:   ? ?Primary osteoarthritis of left knee ?Pleasant 51 year old female, left knee osteoarthritis, last injected March 2021, 50 pound weight loss. ?Persistent/worsening pain, injected it, return to see me 6 weeks as needed. ? ? ? ?___________________________________________ ?Gwen Her. Dianah Field, M.D., ABFM., CAQSM. ?Primary Care and Sports Medicine ?[place] ? ?Adjunct Instructor of Family Medicine  ?[employer] ?

## 2021-07-30 ENCOUNTER — Ambulatory Visit: Payer: 59 | Admitting: Sports Medicine

## 2021-08-02 ENCOUNTER — Ambulatory Visit: Payer: 59 | Admitting: Sports Medicine

## 2021-09-02 IMAGING — DX DG CHEST 2V
2 series · 2 of 2 positions shown · non-contrast
Comparison: None.

CLINICAL DATA: Shortness of breath, productive cough, and fever for
3 days. 3ovid-EG viral infection.

EXAM:
CHEST - 2 VIEW

[chest pa]
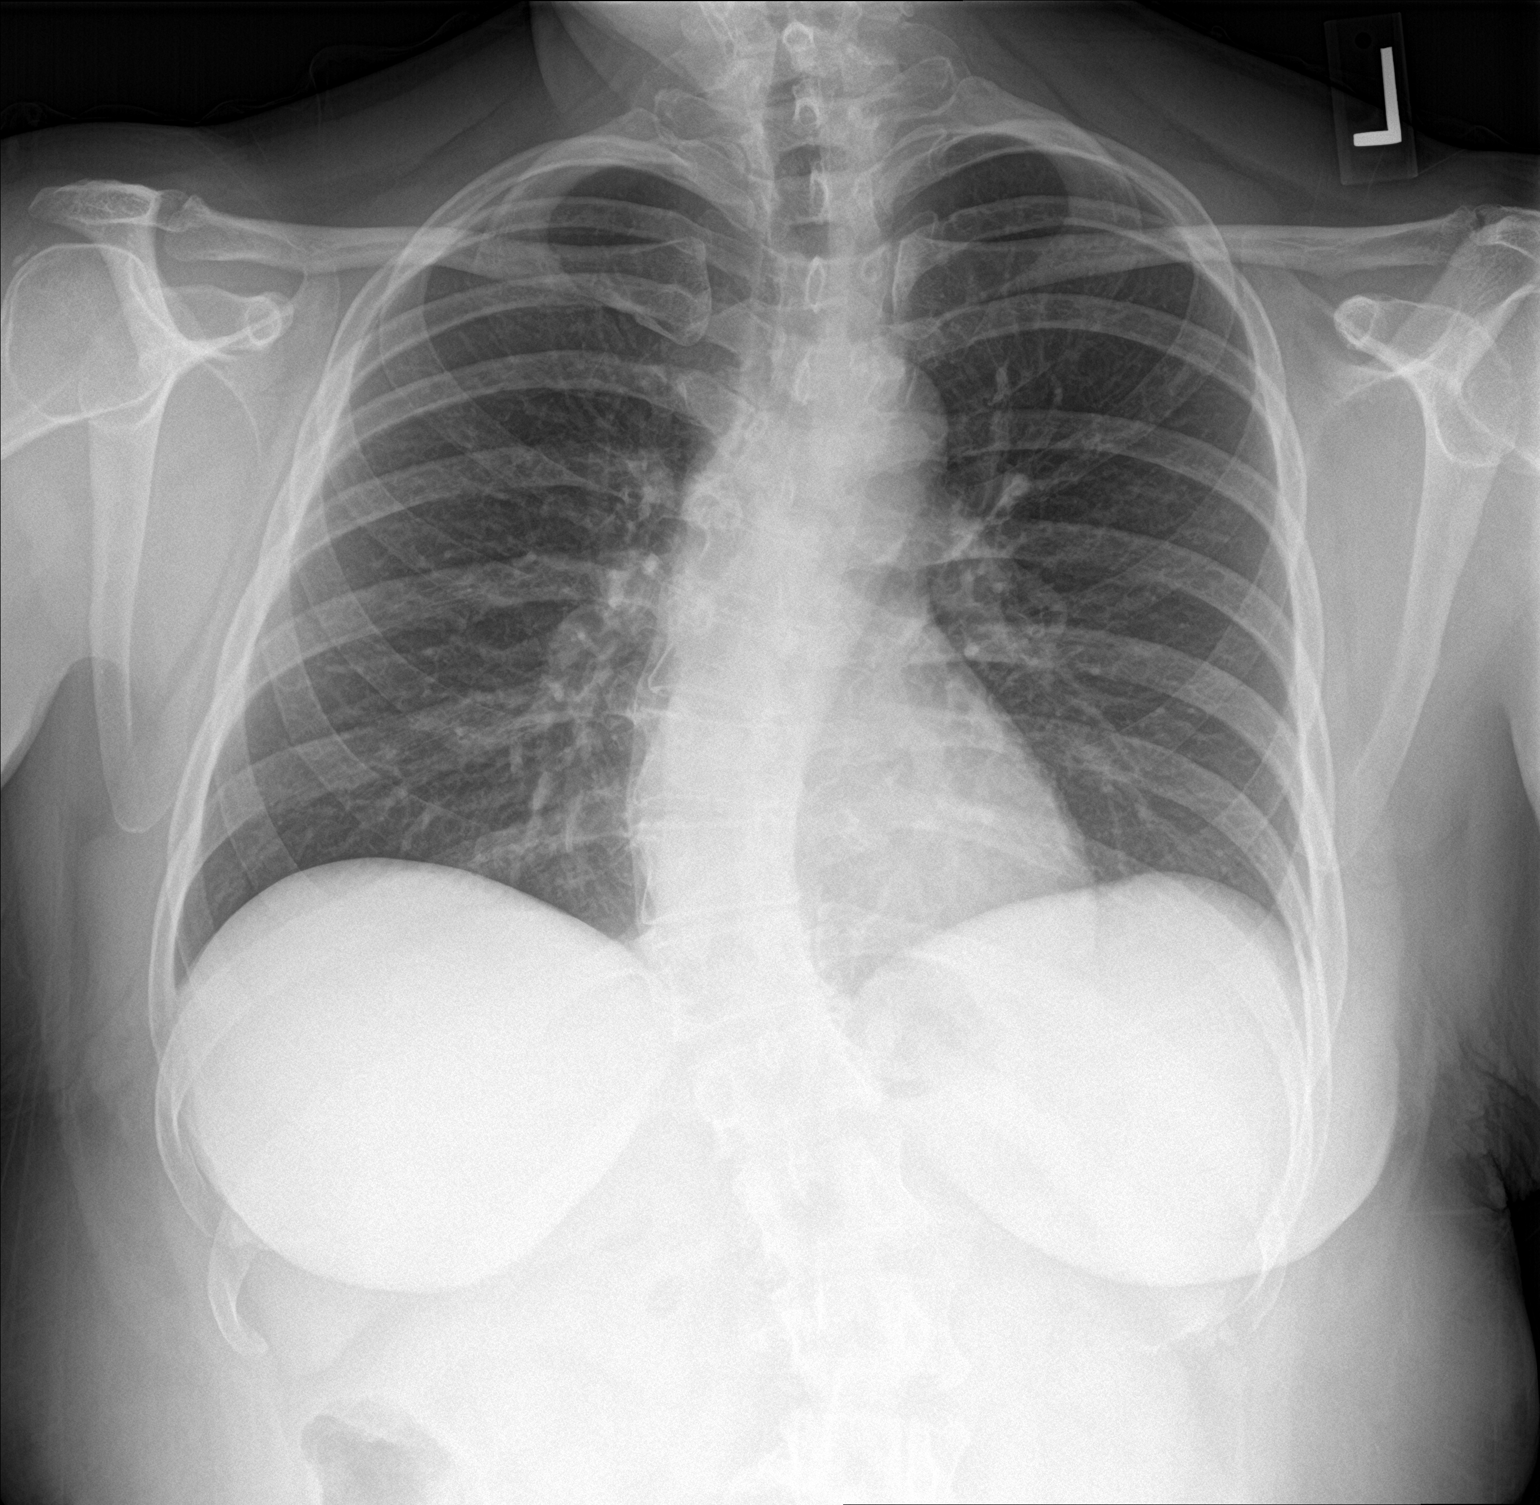

[chest lat]
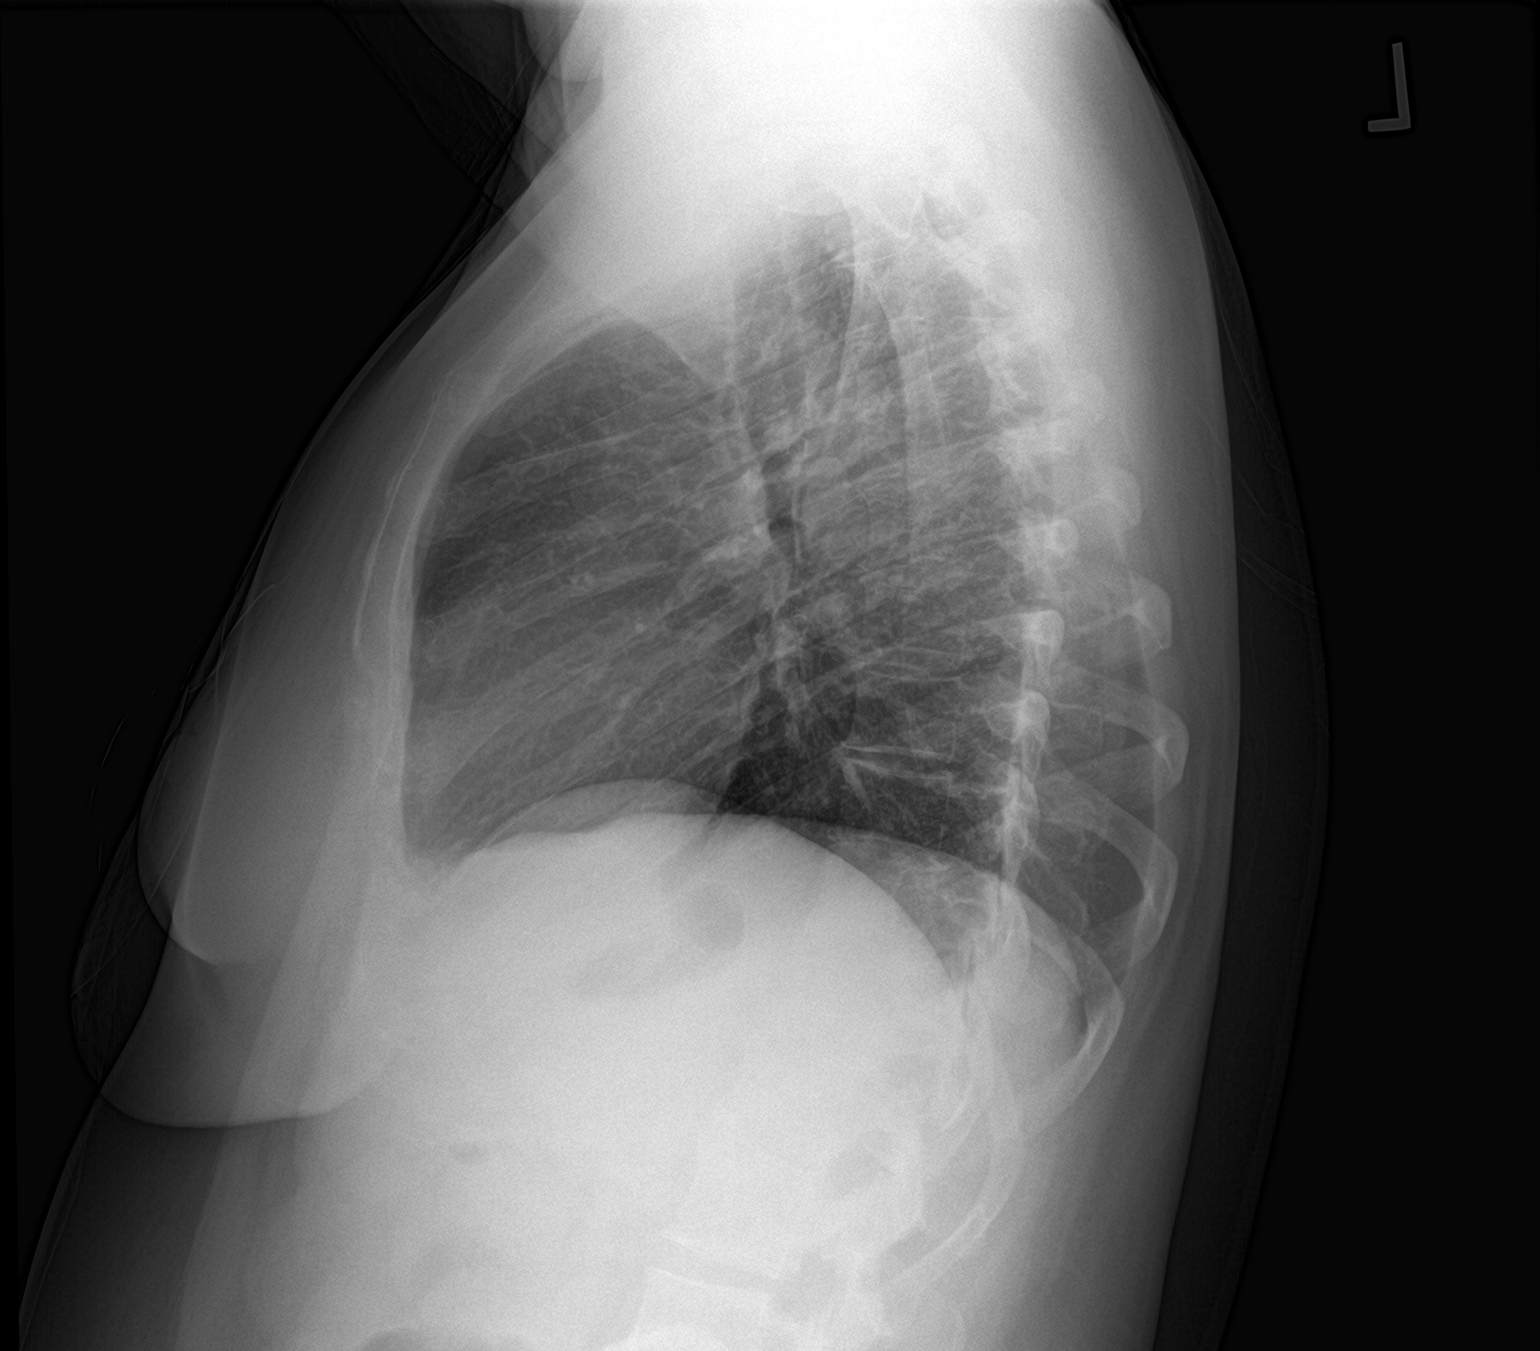

[2 of 2 positions shown; findings below may reference images not displayed]

FINDINGS: The heart size and mediastinal contours are within normal limits.
Both lungs are clear. Moderate thoracic dextroscoliosis noted.
IMPRESSION: No active cardiopulmonary disease.

Scoliosis.

## 2021-09-06 ENCOUNTER — Ambulatory Visit: Payer: 59 | Admitting: Sports Medicine

## 2021-09-07 ENCOUNTER — Encounter: Payer: Self-pay | Admitting: Family Medicine

## 2021-09-09 ENCOUNTER — Other Ambulatory Visit: Payer: Self-pay | Admitting: *Deleted

## 2021-10-12 ENCOUNTER — Ambulatory Visit: Payer: 59 | Admitting: Family Medicine

## 2021-10-12 ENCOUNTER — Encounter: Payer: Self-pay | Admitting: Family Medicine

## 2021-10-12 VITALS — BP 120/89 | HR 93 | Resp 18 | Ht 64.0 in | Wt 172.0 lb

## 2021-10-12 DIAGNOSIS — E119 Type 2 diabetes mellitus without complications: Secondary | ICD-10-CM

## 2021-10-12 DIAGNOSIS — M79644 Pain in right finger(s): Secondary | ICD-10-CM | POA: Diagnosis not present

## 2021-10-12 DIAGNOSIS — Z6829 Body mass index (BMI) 29.0-29.9, adult: Secondary | ICD-10-CM

## 2021-10-12 DIAGNOSIS — I10 Essential (primary) hypertension: Secondary | ICD-10-CM

## 2021-10-12 DIAGNOSIS — Z1211 Encounter for screening for malignant neoplasm of colon: Secondary | ICD-10-CM

## 2021-10-12 LAB — POCT UA - MICROALBUMIN
Albumin/Creatinine Ratio, Urine, POC: <30
Creatinine, POC: 200 mg/dL
Microalbumin Ur, POC: 30 mg/L

## 2021-10-12 LAB — POCT GLYCOSYLATED HEMOGLOBIN (HGB A1C): Hemoglobin A1C: 5.1 % (ref 4.0–5.6)

## 2021-10-12 NOTE — Assessment & Plan Note (Signed)
Well controlled. Continue current regimen. Follow up in  29mo

## 2021-10-12 NOTE — Assessment & Plan Note (Signed)
Well controlled. Continue current regimen. Follow up in  6 mo

## 2021-10-12 NOTE — Assessment & Plan Note (Signed)
She is really done fantastic.  BMI is now 29 down from prior of BMI of 40.  A1c and blood pressure looks phenomenal just keep up the healthy diet and regular exercise.
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
## 2021-10-12 NOTE — Progress Notes (Signed)
Hi Megan [name], no protein in the urine this time which is awesome.  I will see you in 6 months.

## 2021-10-13 LAB — BASIC METABOLIC PANEL WITH GFR
BUN: 14 mg/dL (ref 7–25)
CO2: 27 mmol/L (ref 20–32)
Calcium: 8.9 mg/dL (ref 8.6–10.2)
Chloride: 106 mmol/L (ref 98–110)
Creat: 0.76 mg/dL (ref 0.50–0.99)
Glucose, Bld: 84 mg/dL (ref 65–99)
Potassium: 4 mmol/L (ref 3.5–5.3)
Sodium: 143 mmol/L (ref 135–146)
eGFR: 97 mL/min/{1.73_m2} (ref >=60)

## 2021-10-13 NOTE — Progress Notes (Signed)
Your lab work is within acceptable range and there are no concerning findings.

## 2021-10-28 ENCOUNTER — Ambulatory Visit (INDEPENDENT_AMBULATORY_CARE_PROVIDER_SITE_OTHER): Payer: 59

## 2021-10-28 ENCOUNTER — Ambulatory Visit: Payer: 59 | Admitting: Sports Medicine

## 2021-10-28 DIAGNOSIS — M79641 Pain in right hand: Secondary | ICD-10-CM | POA: Diagnosis not present
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
## 2021-10-29 ENCOUNTER — Telehealth: Payer: Self-pay

## 2021-10-29 LAB — COLOGUARD: COLOGUARD: NEGATIVE

## 2021-10-29 NOTE — Telephone Encounter (Signed)
 I sent it through MyChart message in PDF format.

## 2021-11-26 ENCOUNTER — Ambulatory Visit: Payer: 59 | Admitting: Sports Medicine

## 2022-03-08 ENCOUNTER — Encounter: Payer: Self-pay | Admitting: Family Medicine

## 2022-03-08 DIAGNOSIS — E119 Type 2 diabetes mellitus without complications: Secondary | ICD-10-CM

## 2022-03-09 MED ORDER — SEMAGLUTIDE (2 MG/DOSE) 8 MG/3ML ~~LOC~~ SOPN: 2.0000 mg | PEN_INJECTOR | SUBCUTANEOUS | 2 refills | Status: DC

## 2022-04-26 ENCOUNTER — Encounter: Payer: Self-pay | Admitting: Family Medicine

## 2022-04-26 ENCOUNTER — Ambulatory Visit: Payer: 59 | Admitting: Family Medicine

## 2022-04-26 ENCOUNTER — Other Ambulatory Visit (HOSPITAL_BASED_OUTPATIENT_CLINIC_OR_DEPARTMENT_OTHER): Payer: Self-pay

## 2022-04-26 VITALS — BP 136/75 | HR 90 | Ht 63.0 in | Wt 176.7 lb

## 2022-04-26 DIAGNOSIS — I10 Essential (primary) hypertension: Secondary | ICD-10-CM | POA: Diagnosis not present

## 2022-04-26 DIAGNOSIS — E119 Type 2 diabetes mellitus without complications: Secondary | ICD-10-CM

## 2022-04-26 DIAGNOSIS — Z1159 Encounter for screening for other viral diseases: Secondary | ICD-10-CM

## 2022-04-26 LAB — POCT GLYCOSYLATED HEMOGLOBIN (HGB A1C): HbA1c POC (<> result, manual entry): 5.5 % (ref 4.0–5.6)

## 2022-04-26 MED ORDER — SEMAGLUTIDE (2 MG/DOSE) 8 MG/3ML ~~LOC~~ SOPN
2.0000 mg | PEN_INJECTOR | SUBCUTANEOUS | 2 refills | Status: DC
  Filled 2022-04-26: qty 3, 28d supply, fill #0
  Filled 2022-05-16 – 2022-05-20 (×2): qty 3, 28d supply, fill #1
  Filled 2022-06-15: qty 3, 28d supply, fill #2
  Filled 2022-07-12: qty 3, 28d supply, fill #3
  Filled 2022-08-08: qty 3, 28d supply, fill #4
  Filled 2022-08-31: qty 3, 28d supply, fill #5
  Filled 2022-10-05: qty 3, 28d supply, fill #6
  Filled 2022-10-31: qty 3, 28d supply, fill #7
  Filled 2022-11-27: qty 3, 28d supply, fill #8

## 2022-04-26 NOTE — Assessment & Plan Note (Signed)
Well controlled. Continue current regimen. Follow up in  6 mo  

## 2022-04-26 NOTE — Patient Instructions (Signed)
You can see if they will cover Mounjaro, in place of the Ozempic.

## 2022-04-26 NOTE — Assessment & Plan Note (Signed)
Well controlled. Continue current regimen. Follow up in  68mo continue to work on healthy diet and exercise.

## 2022-04-26 NOTE — Progress Notes (Signed)
   Established Patient Office Visit  Subjective   Patient ID: Megan [name], female    DOB: 1970/05/29  Age: 51 y.o. MRN: [id number]  Chief Complaint  Patient presents with   Follow-up    HPI  Hypertension- Pt denies chest pain, SOB, dizziness, or heart palpitations.  Taking meds as directed w/o problems.  Denies medication side effects.    Diabetes - no hypoglycemic events. No wounds or sores that are not healing well. No increased thirst or urination. Checking glucose at home. Taking medications as prescribed without any side effects. Difficulty getting Ozempic. It is on backorder.       ROS    Objective:     BP 136/75   Pulse 90   Ht 5\' 3"  (1.6 m)   Wt 176 lb 11.2 oz (80.2 kg)   SpO2 100%   BMI 31.30 kg/m    Physical Exam Vitals and nursing note reviewed.  Constitutional:      Appearance: [name].  HENT:     Head: Normocephalic and atraumatic.  Cardiovascular:     Rate and Rhythm: Normal rate and regular rhythm.     Heart sounds: Normal heart sounds.  Pulmonary:     Effort: Pulmonary effort is normal.     Breath sounds: Normal breath sounds.  Skin:    General: Skin is warm and dry.  Neurological:     Mental Status: She is alert and oriented to person, place, and time.  Psychiatric:        Behavior: Behavior normal.     Results for orders placed or performed in visit on 04/26/22  POCT HgB A1C  Result Value Ref Range   Hemoglobin A1C     HbA1c POC (<> result, manual entry) 5.5 4.0 - 5.6 %   HbA1c, POC (prediabetic range)     HbA1c, POC (controlled diabetic range)        The 10-year ASCVD risk score (Arnett DK, et al., 2019) is: 2.5%    Assessment & Plan:   Problem List Items Addressed This Visit       Cardiovascular and Mediastinum   Essential hypertension, benign - Primary    Well controlled. Continue current regimen. Follow up in  19mo       Relevant Orders   Lipid Panel w/reflex Direct LDL   COMPLETE METABOLIC PANEL WITH  GFR   CBC   Hepatitis C Antibody     Endocrine   Diabetes type 2, controlled (HCC)    Well controlled. Continue current regimen. Follow up in  29mo continue to work on healthy diet and exercise.       Relevant Orders   Lipid Panel w/reflex Direct LDL   COMPLETE METABOLIC PANEL WITH GFR   CBC   Hepatitis C Antibody   POCT HgB A1C (Completed)   Other Visit Diagnoses     Encounter for hepatitis C screening test for low risk patient       Relevant Orders   Hepatitis C Antibody      Encouraged her to consider getting her Tdap updated.  She declines today.  Get updated labs including hep C screening.  Return in about 6 months (around 10/26/2022) for Diabetes follow-up.    10/28/2022, MD

## 2022-04-27 ENCOUNTER — Other Ambulatory Visit (HOSPITAL_BASED_OUTPATIENT_CLINIC_OR_DEPARTMENT_OTHER): Payer: Self-pay

## 2022-04-27 LAB — COMPLETE METABOLIC PANEL WITH GFR
AG Ratio: 1.9 (calc) (ref 1.0–2.5)
ALT: 28 U/L (ref 6–29)
AST: 22 U/L (ref 10–35)
Albumin: 4.1 g/dL (ref 3.6–5.1)
Alkaline phosphatase (APISO): 59 U/L (ref 31–125)
BUN: 15 mg/dL (ref 7–25)
CO2: 27 mmol/L (ref 20–32)
Calcium: 9.1 mg/dL (ref 8.6–10.2)
Chloride: 106 mmol/L (ref 98–110)
Creat: 0.87 mg/dL (ref 0.50–0.99)
Globulin: 2.2 g/dL (calc) (ref 1.9–3.7)
Glucose, Bld: 84 mg/dL (ref 65–99)
Potassium: 4 mmol/L (ref 3.5–5.3)
Sodium: 144 mmol/L (ref 135–146)
Total Bilirubin: 0.5 mg/dL (ref 0.2–1.2)
Total Protein: 6.3 g/dL (ref 6.1–8.1)
eGFR: 82 mL/min/{1.73_m2} (ref >=60)

## 2022-04-27 LAB — LIPID PANEL W/REFLEX DIRECT LDL
Cholesterol: 168 mg/dL (ref <200)
HDL: 52 mg/dL (ref >=50)
LDL Cholesterol (Calc): 97 mg/dL (calc)
Non-HDL Cholesterol (Calc): 116 mg/dL (calc) (ref <130)
Total CHOL/HDL Ratio: 3.2 (calc) (ref <5.0)
Triglycerides: 99 mg/dL (ref <150)

## 2022-04-27 LAB — CBC
HCT: 43.5 % (ref 35.0–45.0)
Hemoglobin: 14.6 g/dL (ref 11.7–15.5)
MCH: 30.7 pg (ref 27.0–33.0)
MCHC: 33.6 g/dL (ref 32.0–36.0)
MCV: 91.6 fL (ref 80.0–100.0)
MPV: 10.2 fL (ref 7.5–12.5)
Platelets: 248 10*3/uL (ref 140–400)
RBC: 4.75 10*6/uL (ref 3.80–5.10)
RDW: 12 % (ref 11.0–15.0)
WBC: 6.7 10*3/uL (ref 3.8–10.8)

## 2022-04-27 LAB — HEPATITIS C ANTIBODY: Hepatitis C Ab: NONREACTIVE

## 2022-04-27 NOTE — Progress Notes (Signed)
Your lab work is within acceptable range and there are no concerning findings.   ?

## 2022-05-16 ENCOUNTER — Other Ambulatory Visit (HOSPITAL_BASED_OUTPATIENT_CLINIC_OR_DEPARTMENT_OTHER): Payer: Self-pay

## 2022-05-22 ENCOUNTER — Other Ambulatory Visit: Payer: Self-pay | Admitting: Family Medicine

## 2022-05-22 DIAGNOSIS — I10 Essential (primary) hypertension: Secondary | ICD-10-CM

## 2022-05-24 ENCOUNTER — Telehealth: Payer: 59 | Admitting: Physician Assistant

## 2022-05-24 DIAGNOSIS — H66002 Acute suppurative otitis media without spontaneous rupture of ear drum, left ear: Secondary | ICD-10-CM

## 2022-05-24 MED ORDER — CIPROFLOXACIN-DEXAMETHASONE 0.3-0.1 % OT SUSP
4.0000 [drp] | Freq: Two times a day (BID) | OTIC | 0 refills | Status: DC
  Filled 2022-05-24: qty 7.5, 19d supply, fill #0

## 2022-05-24 MED ORDER — AMOXICILLIN-POT CLAVULANATE 875-125 MG PO TABS
1.0000 | ORAL_TABLET | Freq: Two times a day (BID) | ORAL | 0 refills | Status: DC
  Filled 2022-05-24: qty 20, 10d supply, fill #0

## 2022-05-24 NOTE — Progress Notes (Signed)

## 2022-05-25 ENCOUNTER — Other Ambulatory Visit (HOSPITAL_BASED_OUTPATIENT_CLINIC_OR_DEPARTMENT_OTHER): Payer: Self-pay

## 2022-07-06 ENCOUNTER — Encounter: Payer: Self-pay | Admitting: Sports Medicine

## 2022-07-07 ENCOUNTER — Ambulatory Visit: Payer: 59 | Admitting: Sports Medicine

## 2022-07-08 ENCOUNTER — Ambulatory Visit: Payer: 59 | Admitting: Sports Medicine

## 2022-07-13 ENCOUNTER — Ambulatory Visit: Payer: 59 | Admitting: Sports Medicine

## 2022-07-13 ENCOUNTER — Ambulatory Visit (INDEPENDENT_AMBULATORY_CARE_PROVIDER_SITE_OTHER): Payer: 59

## 2022-07-13 ENCOUNTER — Other Ambulatory Visit (HOSPITAL_BASED_OUTPATIENT_CLINIC_OR_DEPARTMENT_OTHER): Payer: Self-pay

## 2022-07-13 DIAGNOSIS — M5416 Radiculopathy, lumbar region: Secondary | ICD-10-CM

## 2022-07-13 DIAGNOSIS — M25561 Pain in right knee: Secondary | ICD-10-CM | POA: Diagnosis not present

## 2022-07-13 DIAGNOSIS — M25562 Pain in left knee: Secondary | ICD-10-CM | POA: Diagnosis not present

## 2022-07-13 DIAGNOSIS — M545 Low back pain, unspecified: Secondary | ICD-10-CM

## 2022-07-13 DIAGNOSIS — M1712 Unilateral primary osteoarthritis, left knee: Secondary | ICD-10-CM

## 2022-07-13 MED ORDER — NAPROXEN 500 MG PO TABS
500.0000 mg | ORAL_TABLET | Freq: Two times a day (BID) | ORAL | 3 refills | Status: AC
  Filled 2022-07-13: qty 60, 30d supply, fill #0
  Filled 2022-08-08: qty 60, 30d supply, fill #1
  Filled 2022-09-10: qty 60, 30d supply, fill #2
  Filled 2022-10-31: qty 60, 30d supply, fill #3

## 2022-07-13 NOTE — Progress Notes (Signed)
    Procedures performed today:    None.  Independent interpretation of notes and tests performed by another provider:   None.  Brief History, Exam, Impression, and Recommendations:    Left lumbar radiculitis Nocturnal left lumbar radicular symptoms, history of lumbar fusion per patient report, adding x-rays, full PT, naprosyn. Return in 6 weeks.  Patellar subluxation, left, initial encounter Pleasant 52 year old female, historical left knee osteoarthritis, she has had some injections, last of which was in March 2023. She is having somewhat of a new problem now, her patella feels like it is slipping, on exam she does have a positive patellar apprehension sign, I am able to partially subluxed the patella. Explained the pathophysiology, we will add aggressive physical therapy, x-rays, NSAIDs. After 6 weeks we will get a MRI if not better and consider referral for lateral release and medial plication.    ____________________________________________ Gwen Her. Dianah Field, M.D., ABFM., CAQSM., AME. Primary Care and Sports Medicine [facility]  Adjunct Professor of [place] of [facility]  Risk manager

## 2022-07-13 NOTE — Assessment & Plan Note (Signed)
Nocturnal left lumbar radicular symptoms, history of lumbar fusion per patient report, adding x-rays, full PT, naprosyn. Return in 6 weeks.

## 2022-07-13 NOTE — Assessment & Plan Note (Signed)
Pleasant 52 year old female, historical left knee osteoarthritis, she has had some injections, last of which was in March 2023. She is having somewhat of a new problem now, her patella feels like it is slipping, on exam she does have a positive patellar apprehension sign, I am able to partially subluxed the patella. Explained the pathophysiology, we will add aggressive physical therapy, x-rays, NSAIDs. After 6 weeks we will get a MRI if not better and consider referral for lateral release and medial plication.

## 2022-07-16 ENCOUNTER — Encounter: Payer: Self-pay | Admitting: Family Medicine

## 2022-07-18 ENCOUNTER — Ambulatory Visit: Payer: 59 | Admitting: Family Medicine

## 2022-07-18 ENCOUNTER — Encounter: Payer: Self-pay | Admitting: Family Medicine

## 2022-07-18 VITALS — BP 145/65 | HR 85 | Ht 63.0 in | Wt 188.0 lb

## 2022-07-18 DIAGNOSIS — I10 Essential (primary) hypertension: Secondary | ICD-10-CM | POA: Diagnosis not present

## 2022-07-18 DIAGNOSIS — R790 Abnormal level of blood mineral: Secondary | ICD-10-CM

## 2022-07-18 DIAGNOSIS — R202 Paresthesia of skin: Secondary | ICD-10-CM

## 2022-07-18 DIAGNOSIS — G5792 Unspecified mononeuropathy of left lower limb: Secondary | ICD-10-CM

## 2022-07-18 DIAGNOSIS — E119 Type 2 diabetes mellitus without complications: Secondary | ICD-10-CM | POA: Diagnosis not present

## 2022-07-18 LAB — POCT GLYCOSYLATED HEMOGLOBIN (HGB A1C): Hemoglobin A1C: 5.3 % (ref 4.0–5.6)

## 2022-07-18 NOTE — Progress Notes (Signed)
Acute Office Visit  Subjective:     Patient ID: Megan [name], female    DOB: 1970-12-04, 52 y.o.   MRN: [id number]  Chief Complaint  Patient presents with   foot tingling    HPI Patient is in today for left foot numbness and tingling.  Left foot numbness and tingling.  She has been dealing with some left knee pain and actually got into our sports med doc last week.  She is been taking Aleve for that and starts PT on Friday.  The Aleve has been helping with the knee pain but now she is noticed the foot burning sensation and tingling sensation even more.  She says it is constant and never goes away.  Though it can be worse at night.  She will occasionally notice it in her right foot but not very often and it usually does not last all day.  She says by the end of the day she is actually having pain in her foot and it is hard to walk on it.  She has not noticed any swelling or redness or rash.  ROS      Objective:    BP (!) 145/65   Pulse 85   Ht '5\' 3"'$  (1.6 m)   Wt 188 lb (85.3 kg)   SpO2 96%   BMI 33.30 kg/m    Physical Exam Vitals reviewed.  Constitutional:      Appearance: [name].  HENT:     Head: Normocephalic and atraumatic.  Eyes:     Conjunctiva/sclera: Conjunctivae normal.  Cardiovascular:     Rate and Rhythm: Normal rate.  Pulmonary:     Effort: Pulmonary effort is normal.  Musculoskeletal:     Comments: Left foot with normal dorsal pedal and posterior tibial pulses.  No erythema.  Nontender around the ankle or along the metatarsals.  Normal flexion and range of motion of the toes.  Strength is 5 out of 5 in all directions with the ankle.  No Swelling.  Skin:    General: Skin is dry.     Coloration: Skin is not pale.  Neurological:     Mental Status: She is alert and oriented to person, place, and time.  Psychiatric:        Behavior: Behavior normal.     Results for orders placed or performed in visit on 07/18/22  POCT glycosylated  hemoglobin (Hb A1C)  Result Value Ref Range   Hemoglobin A1C 5.3 4.0 - 5.6 %   HbA1c POC (<> result, manual entry)     HbA1c, POC (prediabetic range)     HbA1c, POC (controlled diabetic range)          Assessment & Plan:   Problem List Items Addressed This Visit       Cardiovascular and Mediastinum   Essential hypertension, benign - Primary    Pressure was definitely elevated.  Plan to recheck in a couple of weeks with nurse visit.      Relevant Orders   B12   Vitamin B1   TSH   VITAMIN D 25 Hydroxy (Vit-D Deficiency, Fractures)   Magnesium   Vitamin B6   Copper, serum   BASIC METABOLIC PANEL WITH GFR     Endocrine   Diabetes type 2, controlled (HCC)    1C looks great today at 5.3.  Continue current regimen.      Relevant Orders   POCT glycosylated hemoglobin (Hb A1C) (Completed)   B12   Vitamin B1  TSH   VITAMIN D 25 Hydroxy (Vit-D Deficiency, Fractures)   Magnesium   Vitamin B6   Copper, serum   BASIC METABOLIC PANEL WITH GFR   Other Visit Diagnoses     Paresthesia of left foot       Relevant Orders   B12   Vitamin B1   TSH   VITAMIN D 25 Hydroxy (Vit-D Deficiency, Fractures)   Magnesium   Vitamin B6   Copper, serum   BASIC METABOLIC PANEL WITH GFR   Neuropathy of left foot       Relevant Orders   B12   Vitamin B1   TSH   VITAMIN D 25 Hydroxy (Vit-D Deficiency, Fractures)   Magnesium   Vitamin B6   Copper, serum   BASIC METABOLIC PANEL WITH GFR      Paresthesia/neuropathy of the left foot-we will check for deficiencies and abnormal thyroid disorder etc.  Will call with once labs are available.  If negative then recommend further workup with nerve conduction studies with neurology.  No orders of the defined types were placed in this encounter.   Return in about 2 weeks (around 08/01/2022) for bp check with nurse.  Beatrice Lecher, MD

## 2022-07-18 NOTE — Progress Notes (Signed)
Pt reports that her sxs begain about 2 weeks ago. She describes the sxs as burning,tingling and that it feels like she has a sock on her foot. She has tried bio freeze this hasn't helped

## 2022-07-18 NOTE — Assessment & Plan Note (Signed)
1C looks great today at 5.3.  Continue current regimen.

## 2022-07-18 NOTE — Assessment & Plan Note (Signed)
Pressure was definitely elevated.  Plan to recheck in a couple of weeks with nurse visit.

## 2022-07-19 NOTE — Progress Notes (Signed)
Hi Megan [name] your B12 is low and your vitamin D. I would recommend starting an over-the-counter vitamin D supplement you can start with 50 mcg daily and then we can recheck your level in 8 weeks to make sure that it is coming up.  For the B12 we have 2 options we can start with oral and recheck again in 8 weeks to see if you are absorbing it well.  If you are not then we can always schedule you for injections.  Or if you would rather start with B12 injections that is fine as well.  Other labs so far look good but B1 and B6 and copper are still pending.

## 2022-07-22 ENCOUNTER — Ambulatory Visit: Payer: 59 | Attending: Sports Medicine | Admitting: Physical Therapy

## 2022-07-22 ENCOUNTER — Encounter: Payer: Self-pay | Admitting: Physical Therapy

## 2022-07-22 ENCOUNTER — Other Ambulatory Visit: Payer: Self-pay

## 2022-07-22 DIAGNOSIS — M25562 Pain in left knee: Secondary | ICD-10-CM | POA: Insufficient documentation

## 2022-07-22 DIAGNOSIS — M6281 Muscle weakness (generalized): Secondary | ICD-10-CM | POA: Insufficient documentation

## 2022-07-22 DIAGNOSIS — G8929 Other chronic pain: Secondary | ICD-10-CM | POA: Diagnosis not present

## 2022-07-22 DIAGNOSIS — R262 Difficulty in walking, not elsewhere classified: Secondary | ICD-10-CM | POA: Insufficient documentation

## 2022-07-22 DIAGNOSIS — M1712 Unilateral primary osteoarthritis, left knee: Secondary | ICD-10-CM | POA: Diagnosis not present

## 2022-07-22 NOTE — Therapy (Signed)
OUTPATIENT PHYSICAL THERAPY LOWER EXTREMITY EVALUATION   Patient Name: Megan [name] MRN: [id number] DOB:17-Dec-1970, 52 y.o., female Today's Date: 07/22/2022  END OF SESSION:  PT End of Session - 07/22/22 0838     Visit Number 1    Number of Visits 12    Date for PT Re-Evaluation 09/02/22    PT Start Time 0758    PT Stop Time 0831    PT Time Calculation (min) 33 min    Activity Tolerance Patient tolerated treatment well    Behavior During Therapy [facility] for tasks assessed/performed             Past Medical History:  Diagnosis Date   Gestational diabetes mellitus in childbirth    Past Surgical History:  Procedure Laterality Date   TONSILLECTOMY AND ADENOIDECTOMY     TYMPANOSTOMY TUBE PLACEMENT  1984   Patient Active Problem List   Diagnosis Date Noted   Left lumbar radiculitis 07/13/2022   Right hand pain 10/28/2021   Raynaud's disease, idiopathic 07/08/2019   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[place]) 03/06/2019   Near syncope 02/12/2019   Microalbuminuria due to type 2 diabetes mellitus ([place]) 02/12/2019   Guyon syndrome, 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   Cubital tunnel syndrome on 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   Flexor carpi ulnaris tenosynovitis 08/30/2018   Patellar subluxation, left, initial encounter 09/23/2015   Plantar fasciitis, bilateral 01/08/2014   Reaction, situational, acute, to stress 10/16/2013   Lumbar pars defect 10/15/2012   Idiopathic scoliosis 10/11/2012   Essential hypertension, benign 12/27/2011   Hyperlipidemia 12/27/2011   Diabetes type 2, controlled ([place]) 12/27/2011   POLYCYSTIC OVARIAN DISEASE 05/06/2008    PCP: Madilyn Fireman  REFERRING PROVIDER: Dianah Field  REFERRING DIAG: primary OA of Lt knee, Lt shoulder sublux, lumbar spondylosis  THERAPY DIAG:  Chronic pain of left knee  Muscle weakness (generalized)  Difficulty in walking, not elsewhere classified  Rationale for Evaluation and Treatment: Rehabilitation  ONSET DATE: 2022  SUBJECTIVE:   SUBJECTIVE STATEMENT: Pt has  been having Lt knee pain for about 2 years. She has had steroid injections which helped initially but she is still in pain. She states pain is constant but increases with prolonged walking and standing. She used to walk 3 miles a day but now is only able to walk 1 mile a day. She states she feels like her knee cap is "slipping" out and in. Pain decreases with meds. Pain is mostly in medial knee but occasionally shoots up posterior leg towards hip  PERTINENT HISTORY: Lumbar surgery 2017, scoliosis PAIN:  Are you having pain? Yes: NPRS scale: 3/10 currently, 10/10 at worst/10 Pain location: Lt knee Pain description: sharp, unstable Aggravating factors: walk Relieving factors: meds  PRECAUTIONS: None  WEIGHT BEARING RESTRICTIONS: No  FALLS:  Has patient fallen in last 6 months? No  OCCUPATION: payroll  PLOF: Independent  PATIENT GOALS: decrease pain, return to normal walking routine  NEXT MD VISIT: 4/17  OBJECTIVE:   DIAGNOSTIC FINDINGS: x ray: Mild patellofemoral osteoarthritis. 1. Scoliotic curvature of the lumbar spine, apex to the left. 2. Pedicle rods and screws at L5-S1 without hardware failure. 3. Trace retrolisthesis of L3 versus L4 and L4 versus L5. 4. Degenerative disc disease as above.  PATIENT SURVEYS:  FOTO 52   EDEMA:  None noted on eval   PALPATION: TTP with patella mobs all directions. TTP medial and lateral knee joint line  LOWER EXTREMITY ROM:  Active ROM Right eval Left eval  Hip flexion    Hip extension    Hip abduction  Hip adduction    Hip internal rotation    Hip external rotation    Knee flexion 142 125  Knee extension    Ankle dorsiflexion    Ankle plantarflexion    Ankle inversion    Ankle eversion     (Blank rows = not tested)  LOWER EXTREMITY MMT:  MMT Right eval Left eval  Hip flexion  4+ straight 3+ with ER  Hip extension  4-  Hip abduction  4  Hip adduction    Hip internal rotation    Hip external rotation    Knee  flexion 5/5 4/5  Knee extension 4+/5 4/5  Ankle dorsiflexion    Ankle plantarflexion    Ankle inversion    Ankle eversion     (Blank rows = not tested)   FUNCTIONAL TESTS:   SLS: Rt >15 sec Lt 3 sec  GAIT: Stairs: pt with some increased pain ascending stairs with Lt LE without UE support. Decreased Lt quad strength noted with descending stairs   TODAY'S TREATMENT:                                                                                                                              DATE: 3/15 See HEP    PATIENT EDUCATION:  Education details: PT POC and goals, HEP Person educated: Patient Education method: Explanation, Demonstration, and Handouts Education comprehension: verbalized understanding and returned demonstration  HOME EXERCISE PROGRAM: Access Code: [email or web address] URL: [email or web address] Date: 07/22/2022 Prepared by: Isabelle Course  Exercises - Marching Bridge  - 1 x daily - 7 x weekly - 2 sets - 10 reps - Straight Leg Raise with External Rotation  - 1 x daily - 7 x weekly - 2 sets - 10 reps - Side Stepping with Resistance at Ankles  - 1 x daily - 7 x weekly - 3 sets - 10 reps - Single Leg Balance with Clock Reach  - 1 x daily - 7 x weekly - 3 sets - 10 reps  ASSESSMENT:  CLINICAL IMPRESSION: Patient is a 52 y.o. female who was seen today for physical therapy evaluation and treatment for Lt knee OA and lumbar spondylosis. Pt presents with decreased LE strength, increased Lt knee pain, decreased balance and decreased functional activity tolerance. Pt will benefit from skilled PT to address deficits and improve functional mobility with decreased pain.   OBJECTIVE IMPAIRMENTS: decreased activity tolerance, decreased balance, difficulty walking, decreased ROM, decreased strength, and pain.   ACTIVITY LIMITATIONS: standing, stairs, and locomotion level  PARTICIPATION LIMITATIONS: community activity  PERSONAL FACTORS: Past/current experiences and  Time since onset of injury/illness/exacerbation are also affecting patient's functional outcome.   REHAB POTENTIAL: Good  CLINICAL DECISION MAKING: Evolving/moderate complexity  EVALUATION COMPLEXITY: Moderate   GOALS: Goals reviewed with patient? Yes  SHORT TERM GOALS: Target date: 08/05/2022   Pt will be independent with initial HEP Baseline: Goal status: INITIAL   LONG TERM GOALS: Target date: 09/02/2022  Pt will be independent with advanced HEP Baseline:  Goal status: INITIAL  2.  Pt will improve FOTO to >= 63 to demo improved functional mobility Baseline:  Goal status: INITIAL  3.  Pt will tolerate walking 3 miles with pain <= 2/10 Baseline:  Goal status: INITIAL  4.  Pt will improve Lt LE strength to 4+/5 to improve standing and walking tolerance Baseline:  Goal status: INITIAL    PLAN:  PT FREQUENCY: 1-2x/week  PT DURATION: 6 weeks  PLANNED INTERVENTIONS: Therapeutic exercises, Therapeutic activity, Neuromuscular re-education, Balance training, Gait training, Patient/Family education, Self Care, Joint mobilization, Stair training, Aquatic Therapy, Dry Needling, Electrical stimulation, Cryotherapy, Moist heat, Taping, Ultrasound, Ionotophoresis 4mg /ml Dexamethasone, Manual therapy, and Re-evaluation  PLAN FOR NEXT SESSION: knee strength, balance, eccentrics   Sandhya Denherder, PT 07/22/2022, 8:39 AM

## 2022-07-25 NOTE — Progress Notes (Signed)
B1 and B6, and magnesium are normal. Copper still pending

## 2022-07-28 ENCOUNTER — Ambulatory Visit: Payer: 59

## 2022-07-28 DIAGNOSIS — G8929 Other chronic pain: Secondary | ICD-10-CM | POA: Diagnosis not present

## 2022-07-28 DIAGNOSIS — M6281 Muscle weakness (generalized): Secondary | ICD-10-CM

## 2022-07-28 DIAGNOSIS — R262 Difficulty in walking, not elsewhere classified: Secondary | ICD-10-CM

## 2022-07-28 NOTE — Therapy (Signed)
OUTPATIENT PHYSICAL THERAPY LOWER EXTREMITY TREATMENT   Patient Name: Megan [name] MRN: [id number] DOB:09-Jan-1971, 52 y.o., female Today's Date: 07/28/2022  END OF SESSION:  PT End of Session - 07/28/22 1622     Visit Number 2    Number of Visits 12    Date for PT Re-Evaluation 09/02/22    PT Start Time 1622    PT Stop Time [id number]    PT Time Calculation (min) 42 min    Activity Tolerance Patient tolerated treatment well    Behavior During Therapy [facility] for tasks assessed/performed             Past Medical History:  Diagnosis Date   Gestational diabetes mellitus in childbirth    Past Surgical History:  Procedure Laterality Date   TONSILLECTOMY AND ADENOIDECTOMY     TYMPANOSTOMY TUBE PLACEMENT  1984   Patient Active Problem List   Diagnosis Date Noted   Left lumbar radiculitis 07/13/2022   Right hand pain 10/28/2021   Raynaud's disease, idiopathic 07/08/2019   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[place]) 03/06/2019   Near syncope 02/12/2019   Microalbuminuria due to type 2 diabetes mellitus ([place]) 02/12/2019   Guyon syndrome, 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   Cubital tunnel syndrome on 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   Flexor carpi ulnaris tenosynovitis 08/30/2018   Patellar subluxation, left, initial encounter 09/23/2015   Plantar fasciitis, bilateral 01/08/2014   Reaction, situational, acute, to stress 10/16/2013   Lumbar pars defect 10/15/2012   Idiopathic scoliosis 10/11/2012   Essential hypertension, benign 12/27/2011   Hyperlipidemia 12/27/2011   Diabetes type 2, controlled ([place]) 12/27/2011   POLYCYSTIC OVARIAN DISEASE 05/06/2008    PCP: Madilyn Fireman  REFERRING PROVIDER: Dianah Field  REFERRING DIAG: primary OA of Lt knee, Lt shoulder sublux, lumbar spondylosis  THERAPY DIAG:  Chronic pain of left knee  Muscle weakness (generalized)  Difficulty in walking, not elsewhere classified  Rationale for Evaluation and Treatment: Rehabilitation  ONSET DATE: 2022  SUBJECTIVE:   SUBJECTIVE STATEMENT: Patient  reports 5/10 pain in knee today, states it feels like the knee cap is going to slip out of alignment.   PERTINENT HISTORY: Lumbar surgery 2017, scoliosis PAIN:  Are you having pain? Yes: NPRS scale: 3/10 currently, 10/10 at worst/10 Pain location: Lt knee Pain description: sharp, unstable Aggravating factors: walk Relieving factors: meds  PRECAUTIONS: None  WEIGHT BEARING RESTRICTIONS: No  FALLS:  Has patient fallen in last 6 months? No  OCCUPATION: payroll  PLOF: Independent  PATIENT GOALS: decrease pain, return to normal walking routine  NEXT MD VISIT: 4/17  OBJECTIVE:   DIAGNOSTIC FINDINGS: x ray: Mild patellofemoral osteoarthritis. 1. Scoliotic curvature of the lumbar spine, apex to the left. 2. Pedicle rods and screws at L5-S1 without hardware failure. 3. Trace retrolisthesis of L3 versus L4 and L4 versus L5. 4. Degenerative disc disease as above.  PATIENT SURVEYS:  FOTO 52   EDEMA:  None noted on eval   PALPATION: TTP with patella mobs all directions. TTP medial and lateral knee joint line  LOWER EXTREMITY ROM:  Active ROM Right eval Left eval  Hip flexion    Hip extension    Hip abduction    Hip adduction    Hip internal rotation    Hip external rotation    Knee flexion 142 125  Knee extension    Ankle dorsiflexion    Ankle plantarflexion    Ankle inversion    Ankle eversion     (Blank rows = not tested)  LOWER EXTREMITY MMT:  MMT Right eval Left  eval  Hip flexion  4+ straight 3+ with ER  Hip extension  4-  Hip abduction  4  Hip adduction    Hip internal rotation    Hip external rotation    Knee flexion 5/5 4/5  Knee extension 4+/5 4/5  Ankle dorsiflexion    Ankle plantarflexion    Ankle inversion    Ankle eversion     (Blank rows = not tested)   FUNCTIONAL TESTS:   SLS: Rt >15 sec Lt 3 sec  GAIT: Stairs: pt with some increased pain ascending stairs with Lt LE without UE support. Decreased Lt quad strength noted with  descending stairs   TODAY'S TREATMENT:   [facility] Adult PT Treatment:                                                DATE: 07/28/2022 Therapeutic Exercise: Recumbent bike L3-1 x 5 min SLR in ER x10 (tapping VMO) Bridges + ball squeeze x10 Marching bridge x10 Supine heel slides (patella tracking) Heel raises with tennis ball b/w ankles 2x10 Mini wall squats + ball squeeze x10 Standing TKE with ball 10x3"                                                                                                                               DATE: 3/15 See HEP    PATIENT EDUCATION:  Education details: Updated HEP Person educated: Patient Education method: Explanation, Demonstration, and Handouts Education comprehension: verbalized understanding and returned demonstration  HOME EXERCISE PROGRAM: Access Code: [email or web address] URL: [email or web address] Date: 07/28/2022 Prepared by: Helane Gunther  Exercises - Marching Bridge  - 1 x daily - 7 x weekly - 2 sets - 10 reps - Straight Leg Raise with External Rotation  - 1 x daily - 7 x weekly - 2 sets - 10 reps - Side Stepping with Resistance at Ankles  - 1 x daily - 7 x weekly - 3 sets - 10 reps - Single Leg Balance with Clock Reach  - 1 x daily - 7 x weekly - 3 sets - 10 reps - Supine Bridge with Mini Swiss Ball Between Knees  - 1 x daily - 7 x weekly - 3 sets - 10 reps - Standing Calf Raise With Small Ball at Heels  - 1 x daily - 7 x weekly - 3 sets - 10 reps - Wall Squat with Ball between Knees  - 1 x daily - 7 x weekly - 3 sets - 10 reps - Standing Terminal Knee Extension at Wall with Ball  - 1 x daily - 7 x weekly - 3 sets - 10 reps  ASSESSMENT:  CLINICAL IMPRESSION: Initial knee pain with straight leg raises, however symptoms decreased over time with focus on VMO stimulation (tapping). Hip add isometrics incorporated during bridges and mini  wall squats to progress adductor strength and inner thigh stability. Resisted total knee  extension performed with no exacerbation of knee pain.    OBJECTIVE IMPAIRMENTS: decreased activity tolerance, decreased balance, difficulty walking, decreased ROM, decreased strength, and pain.   ACTIVITY LIMITATIONS: standing, stairs, and locomotion level  PARTICIPATION LIMITATIONS: community activity  PERSONAL FACTORS: Past/current experiences and Time since onset of injury/illness/exacerbation are also affecting patient's functional outcome.   REHAB POTENTIAL: Good  CLINICAL DECISION MAKING: Evolving/moderate complexity  EVALUATION COMPLEXITY: Moderate   GOALS: Goals reviewed with patient? Yes  SHORT TERM GOALS: Target date: 08/05/2022  Pt will be independent with initial HEP Baseline: Goal status: INITIAL   LONG TERM GOALS: Target date: 09/02/2022  Pt will be independent with advanced HEP Baseline:  Goal status: INITIAL  2.  Pt will improve FOTO to >= 63 to demo improved functional mobility Baseline:  Goal status: INITIAL  3.  Pt will tolerate walking 3 miles with pain <= 2/10 Baseline:  Goal status: INITIAL  4.  Pt will improve Lt LE strength to 4+/5 to improve standing and walking tolerance Baseline:  Goal status: INITIAL    PLAN:  PT FREQUENCY: 1-2x/week  PT DURATION: 6 weeks  PLANNED INTERVENTIONS: Therapeutic exercises, Therapeutic activity, Neuromuscular re-education, Balance training, Gait training, Patient/Family education, Self Care, Joint mobilization, Stair training, Aquatic Therapy, Dry Needling, Electrical stimulation, Cryotherapy, Moist heat, Taping, Ultrasound, Ionotophoresis 4mg /ml Dexamethasone, Manual therapy, and Re-evaluation  PLAN FOR NEXT SESSION: Continue knee strength, balance, eccentrics   Hardin Negus, PTA 07/28/2022, 5:04 PM

## 2022-07-29 LAB — VITAMIN B6: Vitamin B6: 13.7 ng/mL (ref 2.1–21.7)

## 2022-07-29 LAB — BASIC METABOLIC PANEL WITH GFR
BUN: 15 mg/dL (ref 7–25)
CO2: 26 mmol/L (ref 20–32)
Calcium: 9.1 mg/dL (ref 8.6–10.2)
Chloride: 107 mmol/L (ref 98–110)
Creat: 0.75 mg/dL (ref 0.50–0.99)
Glucose, Bld: 85 mg/dL (ref 65–99)
Potassium: 3.7 mmol/L (ref 3.5–5.3)
Sodium: 142 mmol/L (ref 135–146)
eGFR: 98 mL/min/{1.73_m2} (ref >=60)

## 2022-07-29 LAB — COPPER, SERUM: Copper: 206 ug/dL — ABNORMAL HIGH (ref 70–175)

## 2022-07-29 LAB — VITAMIN B1: Vitamin B1 (Thiamine): 10 nmol/L (ref 8–30)

## 2022-07-29 LAB — TSH: TSH: 2.19 mIU/L

## 2022-07-29 LAB — VITAMIN D 25 HYDROXY (VIT D DEFICIENCY, FRACTURES): Vit D, 25-Hydroxy: 23 ng/mL — ABNORMAL LOW (ref 30–100)

## 2022-07-29 LAB — MAGNESIUM: Magnesium: 2.1 mg/dL (ref 1.5–2.5)

## 2022-07-29 LAB — VITAMIN B12: Vitamin B-12: 197 pg/mL — ABNORMAL LOW (ref 200–1100)

## 2022-08-02 ENCOUNTER — Ambulatory Visit: Payer: 59

## 2022-08-02 ENCOUNTER — Encounter: Payer: Self-pay | Admitting: Family Medicine

## 2022-08-03 ENCOUNTER — Encounter: Payer: Self-pay | Admitting: Family Medicine

## 2022-08-03 NOTE — Telephone Encounter (Signed)
See result notes. 

## 2022-08-03 NOTE — Telephone Encounter (Signed)
Order as follows" B12 10108mcg [place]/IM Q2 weeks x 8 week, then recheck B12 before next injection.

## 2022-08-03 NOTE — Addendum Note (Signed)
Addended by: Beatrice Lecher D on: 08/03/2022 01:51 PM   Modules accepted: Orders

## 2022-08-03 NOTE — Progress Notes (Signed)
Hi Megan [name], have you thought about the B12?  Are you wanting to start injections here in our office.  If we do injections we would set you up every 2 weeks for 6 months and then we would recheck it and see if the number is coming up and we can space it to once a month.  Please see prior note regarding starting an over-the-counter vitamin D supplement.  B1 and B6 are normal.  Your copper level is actually a little elevated.  Most of the time increased copper levels come from consumption it can come from water or acidic foods cooked in copper cookware.  Also get can be found in certain supplements and products.  If you are taking anything with excess copper I would discontinue it.  There is also a rare disorder that causes the body to hold onto too much copper called Wilson's disease.  Thus, I would like to do some additional blood work if you are able to come back sometime next week that would be great.  I would like you to go to our lab downstairs which is [facility].  One of the test that I want to order I can only get through [place].

## 2022-08-03 NOTE — Telephone Encounter (Signed)
Spoke with patient and informed of results.

## 2022-08-04 ENCOUNTER — Ambulatory Visit: Payer: 59 | Admitting: Physical Therapy

## 2022-08-04 ENCOUNTER — Encounter: Payer: Self-pay | Admitting: Physical Therapy

## 2022-08-04 DIAGNOSIS — M6281 Muscle weakness (generalized): Secondary | ICD-10-CM

## 2022-08-04 DIAGNOSIS — R262 Difficulty in walking, not elsewhere classified: Secondary | ICD-10-CM

## 2022-08-04 DIAGNOSIS — G8929 Other chronic pain: Secondary | ICD-10-CM | POA: Diagnosis not present

## 2022-08-04 NOTE — Therapy (Signed)
OUTPATIENT PHYSICAL THERAPY LOWER EXTREMITY TREATMENT   Patient Name: Megan [name] MRN: [id number] DOB:20-Sep-1970, 52 y.o., female Today's Date: 08/04/2022  END OF SESSION:  PT End of Session - 08/04/22 1010     Visit Number 3    Number of Visits 12    Date for PT Re-Evaluation 09/02/22    PT Start Time 0930    PT Stop Time 1010    PT Time Calculation (min) 40 min    Activity Tolerance Patient tolerated treatment well    Behavior During Therapy [facility] for tasks assessed/performed              Past Medical History:  Diagnosis Date   Gestational diabetes mellitus in childbirth    Past Surgical History:  Procedure Laterality Date   TONSILLECTOMY AND ADENOIDECTOMY     TYMPANOSTOMY TUBE PLACEMENT  1984   Patient Active Problem List   Diagnosis Date Noted   Left lumbar radiculitis 07/13/2022   Right hand pain 10/28/2021   Raynaud's disease, idiopathic 07/08/2019   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[place]) 03/06/2019   Near syncope 02/12/2019   Microalbuminuria due to type 2 diabetes mellitus ([place]) 02/12/2019   Guyon syndrome, 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   Cubital tunnel syndrome on 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   Flexor carpi ulnaris tenosynovitis 08/30/2018   Patellar subluxation, left, initial encounter 09/23/2015   Plantar fasciitis, bilateral 01/08/2014   Reaction, situational, acute, to stress 10/16/2013   Lumbar pars defect 10/15/2012   Idiopathic scoliosis 10/11/2012   Essential hypertension, benign 12/27/2011   Hyperlipidemia 12/27/2011   Diabetes type 2, controlled ([place]) 12/27/2011   POLYCYSTIC OVARIAN DISEASE 05/06/2008    PCP: Madilyn Fireman  REFERRING PROVIDER: Dianah Field  REFERRING DIAG: primary OA of Lt knee, Lt shoulder sublux, lumbar spondylosis  THERAPY DIAG:  Chronic pain of left knee  Muscle weakness (generalized)  Difficulty in walking, not elsewhere classified  Rationale for Evaluation and Treatment: Rehabilitation  ONSET DATE: 2022  SUBJECTIVE:   SUBJECTIVE STATEMENT: Pt  states "pulling" feeling in knee with stair negotiation. Feels like her patella will "slip out" when she pivots quickly  PERTINENT HISTORY: Lumbar surgery 2017, scoliosis PAIN:  Are you having pain? Yes: NPRS scale: 3/10 currently, 10/10 at worst/10 Pain location: Lt knee Pain description: sharp, unstable Aggravating factors: walk Relieving factors: meds  PRECAUTIONS: None  WEIGHT BEARING RESTRICTIONS: No  FALLS:  Has patient fallen in last 6 months? No  OCCUPATION: payroll  PLOF: Independent  PATIENT GOALS: decrease pain, return to normal walking routine  NEXT MD VISIT: 4/17  OBJECTIVE:   DIAGNOSTIC FINDINGS: x ray: Mild patellofemoral osteoarthritis. 1. Scoliotic curvature of the lumbar spine, apex to the left. 2. Pedicle rods and screws at L5-S1 without hardware failure. 3. Trace retrolisthesis of L3 versus L4 and L4 versus L5. 4. Degenerative disc disease as above.  PATIENT SURVEYS:  FOTO 52   EDEMA:  None noted on eval   PALPATION: TTP with patella mobs all directions. TTP medial and lateral knee joint line  LOWER EXTREMITY ROM:  Active ROM Right eval Left eval  Hip flexion    Hip extension    Hip abduction    Hip adduction    Hip internal rotation    Hip external rotation    Knee flexion 142 125  Knee extension    Ankle dorsiflexion    Ankle plantarflexion    Ankle inversion    Ankle eversion     (Blank rows = not tested)  LOWER EXTREMITY MMT:  MMT Right eval Left eval  Hip flexion  4+ straight 3+ with ER  Hip extension  4-  Hip abduction  4  Hip adduction    Hip internal rotation    Hip external rotation    Knee flexion 5/5 4/5  Knee extension 4+/5 4/5  Ankle dorsiflexion    Ankle plantarflexion    Ankle inversion    Ankle eversion     (Blank rows = not tested)   FUNCTIONAL TESTS:   SLS: Rt >15 sec Lt 3 sec  GAIT: Stairs: pt with some increased pain ascending stairs with Lt LE without UE support. Decreased Lt quad strength  noted with descending stairs   TODAY'S TREATMENT:  [facility] Adult PT Treatment:                                                DATE: 08/04/22 Therapeutic Exercise: Recumbent bike L3 x 5 Standing TKE 10 x 3 sec hold Side lunge x 10 bilat Lateral step down eccentric x 15 Hip 3 way on slider x 10 focus on Lt LE stance SLR in ER 2 x 10 Bridge with add squeeze 2 x 10 Bridge with add squeeze with alternating kicks x 10 Mini squat ball between knees x 10 with cues for form Wall squat x 10 Heel raise ball between heels x 20   [facility] Adult PT Treatment:                                                DATE: 07/28/2022 Therapeutic Exercise: Recumbent bike L3-1 x 5 min SLR in ER x10 (tapping VMO) Bridges + ball squeeze x10 Marching bridge x10 Supine heel slides (patella tracking) Heel raises with tennis ball b/w ankles 2x10 Mini wall squats + ball squeeze x10 Standing TKE with ball 10x3"    PATIENT EDUCATION:  Education details: Updated HEP Person educated: Patient Education method: Explanation, Demonstration, and Handouts Education comprehension: verbalized understanding and returned demonstration  HOME EXERCISE PROGRAM: Access Code: [email or web address] URL: [email or web address] Date: 07/28/2022 Prepared by: Helane Gunther  Exercises - Marching Bridge  - 1 x daily - 7 x weekly - 2 sets - 10 reps - Straight Leg Raise with External Rotation  - 1 x daily - 7 x weekly - 2 sets - 10 reps - Side Stepping with Resistance at Ankles  - 1 x daily - 7 x weekly - 3 sets - 10 reps - Single Leg Balance with Clock Reach  - 1 x daily - 7 x weekly - 3 sets - 10 reps - Supine Bridge with Mini Swiss Ball Between Knees  - 1 x daily - 7 x weekly - 3 sets - 10 reps - Standing Calf Raise With Small Ball at Heels  - 1 x daily - 7 x weekly - 3 sets - 10 reps - Wall Squat with Ball between Knees  - 1 x daily - 7 x weekly - 3 sets - 10 reps - Standing Terminal Knee Extension at Wall with Ball  - 1 x daily -  7 x weekly - 3 sets - 10 reps  ASSESSMENT:  CLINICAL IMPRESSION: Pt is improving strength, better tolerance to SLR with ER and bridge with march today. Requires cues for side lunges and squats.  Discussed rationale for strength training and continued POC   OBJECTIVE IMPAIRMENTS: decreased activity tolerance, decreased balance, difficulty walking, decreased ROM, decreased strength, and pain.    GOALS: Goals reviewed with patient? Yes  SHORT TERM GOALS: Target date: 08/05/2022  Pt will be independent with initial HEP Baseline: Goal status: MET   LONG TERM GOALS: Target date: 09/02/2022  Pt will be independent with advanced HEP Baseline:  Goal status: INITIAL  2.  Pt will improve FOTO to >= 63 to demo improved functional mobility Baseline:  Goal status: INITIAL  3.  Pt will tolerate walking 3 miles with pain <= 2/10 Baseline:  Goal status: INITIAL  4.  Pt will improve Lt LE strength to 4+/5 to improve standing and walking tolerance Baseline:  Goal status: INITIAL    PLAN:  PT FREQUENCY: 1-2x/week  PT DURATION: 6 weeks  PLANNED INTERVENTIONS: Therapeutic exercises, Therapeutic activity, Neuromuscular re-education, Balance training, Gait training, Patient/Family education, Self Care, Joint mobilization, Stair training, Aquatic Therapy, Dry Needling, Electrical stimulation, Cryotherapy, Moist heat, Taping, Ultrasound, Ionotophoresis 4mg /ml Dexamethasone, Manual therapy, and Re-evaluation  PLAN FOR NEXT SESSION: Continue knee strength, balance, eccentrics   Baraka Klatt, PT 08/04/2022, 10:10 AM

## 2022-08-08 ENCOUNTER — Ambulatory Visit (INDEPENDENT_AMBULATORY_CARE_PROVIDER_SITE_OTHER): Payer: 59 | Admitting: Family Medicine

## 2022-08-08 VITALS — BP 126/61 | HR 83 | Temp 97.7°F | Ht 63.0 in

## 2022-08-08 DIAGNOSIS — E538 Deficiency of other specified B group vitamins: Secondary | ICD-10-CM | POA: Diagnosis not present

## 2022-08-08 MED ORDER — CYANOCOBALAMIN 1000 MCG/ML IJ SOLN
1000.0000 ug | Freq: Once | INTRAMUSCULAR | Status: AC
  Administered 2022-08-08: 1000 ug via INTRAMUSCULAR

## 2022-08-08 NOTE — Progress Notes (Signed)
   Established Patient Office Visit  Subjective   Patient ID: Megan [name], female    DOB: 31-Jul-1970  Age: 52 y.o. MRN: [id number]  Chief Complaint  Patient presents with   vit B12 deficiency    Initial B12 injection.     HPI  Vitamin B12 deficiency- nurse visit- initial B12 injection. Patient in office feeling well today.   ROS    Objective:     BP 126/61   Pulse 83   Temp 97.7 F (36.5 C)   Ht 5\' 3"  (1.6 m)   SpO2 100%   BMI 33.30 kg/m    Physical Exam   No results found for any visits on 08/08/22.    The 10-year ASCVD risk score (Arnett DK, et al., 2019) is: 2.4%    Assessment & Plan:  Initial Vitamin B12 injection- nurse visit.  Injected 1,0105mcg IM R deltoid .Patient sat in office after injection for 10 minutes.  Patient tolerated injection well without complications. Patient will return in 2 weeks for nurse visit for B12 injection.  Problem List Items Addressed This Visit   None   No follow-ups on file.    Rae Lips, LPN

## 2022-08-08 NOTE — Progress Notes (Signed)
Agree with documentation as above.   Rollan Roger, MD  

## 2022-08-08 NOTE — Patient Instructions (Signed)
Patient will return in 2 weeks for nurse visit Vitamin B12 injection.

## 2022-08-09 LAB — GENESEQ PLUS, ATP7B

## 2022-08-11 LAB — COPPER, URINE - RANDOM OR 24 HOUR
Copper / Creatinine Ratio: 8 ug/g creat (ref 0–49)
Copper, 24H Ur: 0 ug/24 hr — ABNORMAL LOW (ref 3–35)
Copper, Ur: 32 ug/L
Creatinine(Crt),U: 4.09 g/L — ABNORMAL HIGH (ref 0.30–3.00)

## 2022-08-12 ENCOUNTER — Ambulatory Visit: Payer: 59 | Attending: Sports Medicine

## 2022-08-12 ENCOUNTER — Encounter: Payer: Self-pay | Admitting: Family Medicine

## 2022-08-12 DIAGNOSIS — G8929 Other chronic pain: Secondary | ICD-10-CM | POA: Insufficient documentation

## 2022-08-12 DIAGNOSIS — R262 Difficulty in walking, not elsewhere classified: Secondary | ICD-10-CM

## 2022-08-12 DIAGNOSIS — M25562 Pain in left knee: Secondary | ICD-10-CM | POA: Insufficient documentation

## 2022-08-12 DIAGNOSIS — M6281 Muscle weakness (generalized): Secondary | ICD-10-CM | POA: Insufficient documentation

## 2022-08-12 NOTE — Therapy (Signed)
OUTPATIENT PHYSICAL THERAPY LOWER EXTREMITY TREATMENT   Patient Name: Megan [name] MRN: [id number] DOB:14-Jun-1970, 52 y.o., female Today's Date: 08/12/2022  END OF SESSION:  PT End of Session - 08/12/22 0756     Visit Number 4    Number of Visits 12    Date for PT Re-Evaluation 09/02/22    PT Start Time 0758    PT Stop Time 0843    PT Time Calculation (min) 45 min    Activity Tolerance Patient tolerated treatment well    [facility] assessed/performed              Past Medical History:  Diagnosis Date   Gestational diabetes mellitus in childbirth    Past Surgical History:  Procedure Laterality Date   TONSILLECTOMY AND ADENOIDECTOMY     TYMPANOSTOMY TUBE PLACEMENT  1984   Patient Active Problem List   Diagnosis Date Noted   Left lumbar radiculitis 07/13/2022   Right hand pain 10/28/2021   Raynaud's disease, idiopathic 07/08/2019   Hypoproteinemia 03/06/2019   Near syncope 02/12/2019   Microalbuminuria due to type 2 diabetes mellitus 02/12/2019   Guyon syndrome, 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   Cubital tunnel syndrome on 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   Flexor carpi ulnaris tenosynovitis 08/30/2018   Patellar subluxation, left, initial encounter 09/23/2015   Plantar fasciitis, bilateral 01/08/2014   Reaction, situational, acute, to stress 10/16/2013   Lumbar pars defect 10/15/2012   Idiopathic scoliosis 10/11/2012   Essential hypertension, benign 12/27/2011   Hyperlipidemia 12/27/2011   Diabetes type 2, controlled 12/27/2011   POLYCYSTIC OVARIAN DISEASE 05/06/2008    PCP: Linford ArnoldMetheney  REFERRING PROVIDER: Benjamin Stainhekkekandam  REFERRING DIAG: primary OA of Lt knee, Lt shoulder sublux, lumbar spondylosis  THERAPY DIAG:  Chronic pain of left knee  Muscle weakness (generalized)  Difficulty in walking, not elsewhere classified  Rationale for Evaluation and Treatment: Rehabilitation  ONSET DATE: 2022  SUBJECTIVE:   SUBJECTIVE STATEMENT: Patient reports her knee is  not as painful, states she continues to feel a random "slipping" sensation with patella.   PERTINENT HISTORY: Lumbar surgery 2017, scoliosis PAIN:  Are you having pain? Yes: NPRS scale: 3/10 currently, 10/10 at worst/10 Pain location: Lt knee Pain description: sharp, unstable Aggravating factors: walk Relieving factors: meds  PRECAUTIONS: None  WEIGHT BEARING RESTRICTIONS: No  FALLS:  Has patient fallen in last 6 months? No  OCCUPATION: payroll  PLOF: Independent  PATIENT GOALS: decrease pain, return to normal walking routine  NEXT MD VISIT: 4/17  OBJECTIVE:   DIAGNOSTIC FINDINGS: x ray: Mild patellofemoral osteoarthritis. 1. Scoliotic curvature of the lumbar spine, apex to the left. 2. Pedicle rods and screws at L5-S1 without hardware failure. 3. Trace retrolisthesis of L3 versus L4 and L4 versus L5. 4. Degenerative disc disease as above.  PATIENT SURVEYS:  FOTO 52   EDEMA:  None noted on eval   PALPATION: TTP with patella mobs all directions. TTP medial and lateral knee joint line  LOWER EXTREMITY ROM:  Active ROM Right eval Left eval  Hip flexion    Hip extension    Hip abduction    Hip adduction    Hip internal rotation    Hip external rotation    Knee flexion 142 125  Knee extension    Ankle dorsiflexion    Ankle plantarflexion    Ankle inversion    Ankle eversion     (Blank rows = not tested)  LOWER EXTREMITY MMT:  MMT Right eval Left eval  Hip flexion  4+ straight 3+ with ER  Hip extension  4-  Hip abduction  4  Hip adduction    Hip internal rotation    Hip external rotation    Knee flexion 5/5 4/5  Knee extension 4+/5 4/5  Ankle dorsiflexion    Ankle plantarflexion    Ankle inversion    Ankle eversion     (Blank rows = not tested)   FUNCTIONAL TESTS:   SLS: Rt >15 sec Lt 3 sec  GAIT: Stairs: pt with some increased pain ascending stairs with Lt LE without UE support. Decreased Lt quad strength noted with descending  stairs   TODAY'S TREATMENT:   [facility] Adult PT Treatment:                                                DATE: 08/12/2022 Therapeutic Exercise: Recumbent bike L5 x 5 min Wall squat + ball squeeze pulses at bottom x10 Seated hip isometrics add & abd (blocking with hands) 5x5" each L LAQ x10 + VMO tapping BFR: 160 mmHg max pressure - 96 mmHg (60%) L SLR in ER (small range) 30x5" --> 30" rest --> 3x15 with 30" rest b/w sets Side monster walks with black super band VMO lunges    [facility] Adult PT Treatment:                                                DATE: 08/04/22 Therapeutic Exercise: Recumbent bike L3 x 5 Standing TKE 10 x 3 sec hold Side lunge x 10 bilat Lateral step down eccentric x 15 Hip 3 way on slider x 10 focus on Lt LE stance SLR in ER 2 x 10 Bridge with add squeeze 2 x 10 Bridge with add squeeze with alternating kicks x 10 Mini squat ball between knees x 10 with cues for form Wall squat x 10 Heel raise ball between heels x 20   PATIENT EDUCATION:  Education details: standing weight shifting A/P instead of lateral Person educated: Patient Education method: Explanation, Demonstration, and Handouts Education comprehension: verbalized understanding and returned demonstration  HOME EXERCISE PROGRAM: Access Code: [email or web address] URL: [email or web address] Date: 07/28/2022 Prepared by: Carlynn HeraldKathryn Ritamarie Arkin  Exercises - Marching Bridge  - 1 x daily - 7 x weekly - 2 sets - 10 reps - Straight Leg Raise with External Rotation  - 1 x daily - 7 x weekly - 2 sets - 10 reps - Side Stepping with Resistance at Ankles  - 1 x daily - 7 x weekly - 3 sets - 10 reps - Single Leg Balance with Clock Reach  - 1 x daily - 7 x weekly - 3 sets - 10 reps - Supine Bridge with Mini Swiss Ball Between Knees  - 1 x daily - 7 x weekly - 3 sets - 10 reps - Standing Calf Raise With Small Ball at Heels  - 1 x daily - 7 x weekly - 3 sets - 10 reps - Wall Squat with Ball between Knees  - 1 x daily - 7  x weekly - 3 sets - 10 reps - Standing Terminal Knee Extension at Wall with Ball  - 1 x daily - 7 x weekly - 3 sets - 10 reps  ASSESSMENT:  CLINICAL IMPRESSION: Blood flow restriction incorporated with straight leg raises to progress VMO strengthening. Discussion with patient on keeping knee mobil when sitting at desk and switching to A/P weight shifting in standing instead of side to side for back/knee discomfort.  OBJECTIVE IMPAIRMENTS: decreased activity tolerance, decreased balance, difficulty walking, decreased ROM, decreased strength, and pain.    GOALS: Goals reviewed with patient? Yes  SHORT TERM GOALS: Target date: 08/05/2022  Pt will be independent with initial HEP Baseline: Goal status: MET   LONG TERM GOALS: Target date: 09/02/2022  Pt will be independent with advanced HEP Baseline:  Goal status: INITIAL  2.  Pt will improve FOTO to >= 63 to demo improved functional mobility Baseline:  Goal status: INITIAL  3.  Pt will tolerate walking 3 miles with pain <= 2/10 Baseline:  Goal status: INITIAL  4.  Pt will improve Lt LE strength to 4+/5 to improve standing and walking tolerance Baseline:  Goal status: INITIAL   PLAN:  PT FREQUENCY: 1-2x/week  PT DURATION: 6 weeks  PLANNED INTERVENTIONS: Therapeutic exercises, Therapeutic activity, Neuromuscular re-education, Balance training, Gait training, Patient/Family education, Self Care, Joint mobilization, Stair training, Aquatic Therapy, Dry Needling, Electrical stimulation, Cryotherapy, Moist heat, Taping, Ultrasound, Ionotophoresis 4mg /ml Dexamethasone, Manual therapy, and Re-evaluation  PLAN FOR NEXT SESSION: FOTO intake next visit. Follow up on BFR response. Continue knee strength, balance, eccentrics   Sanjuana Mae, PTA 08/12/2022, 8:43 AM

## 2022-08-19 ENCOUNTER — Ambulatory Visit: Payer: 59

## 2022-08-19 DIAGNOSIS — R262 Difficulty in walking, not elsewhere classified: Secondary | ICD-10-CM

## 2022-08-19 DIAGNOSIS — M6281 Muscle weakness (generalized): Secondary | ICD-10-CM

## 2022-08-19 DIAGNOSIS — M25562 Pain in left knee: Secondary | ICD-10-CM | POA: Diagnosis not present

## 2022-08-19 DIAGNOSIS — G8929 Other chronic pain: Secondary | ICD-10-CM

## 2022-08-19 NOTE — Therapy (Signed)
OUTPATIENT PHYSICAL THERAPY LOWER EXTREMITY TREATMENT   Patient Name: Megan [name] MRN: [id number] DOB:1970-12-30, 52 y.o., female Today's Date: 08/19/2022  END OF SESSION:  PT End of Session - 08/19/22 0801     Visit Number 5    Number of Visits 12    Date for PT Re-Evaluation 09/02/22    PT Start Time 0800    PT Stop Time 0900    PT Time Calculation (min) 60 min    Activity Tolerance Patient tolerated treatment well    Behavior During Therapy [facility] for tasks assessed/performed              Past Medical History:  Diagnosis Date   Gestational diabetes mellitus in childbirth    Past Surgical History:  Procedure Laterality Date   TONSILLECTOMY AND ADENOIDECTOMY     TYMPANOSTOMY TUBE PLACEMENT  1984   Patient Active Problem List   Diagnosis Date Noted   Left lumbar radiculitis 07/13/2022   Right hand pain 10/28/2021   Raynaud's disease, idiopathic 07/08/2019   Hypoproteinemia 03/06/2019   Near syncope 02/12/2019   Microalbuminuria due to type 2 diabetes mellitus 02/12/2019   Guyon syndrome, 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   Cubital tunnel syndrome on 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   Flexor carpi ulnaris tenosynovitis 08/30/2018   Patellar subluxation, left, initial encounter 09/23/2015   Plantar fasciitis, bilateral 01/08/2014   Reaction, situational, acute, to stress 10/16/2013   Lumbar pars defect 10/15/2012   Idiopathic scoliosis 10/11/2012   Essential hypertension, benign 12/27/2011   Hyperlipidemia 12/27/2011   Diabetes type 2, controlled 12/27/2011   POLYCYSTIC OVARIAN DISEASE 05/06/2008    PCP: Linford Arnold  REFERRING PROVIDER: Benjamin Stain  REFERRING DIAG: primary OA of Lt knee, Lt shoulder sublux, lumbar spondylosis  THERAPY DIAG:  Chronic pain of left knee  Muscle weakness (generalized)  Difficulty in walking, not elsewhere classified  Rationale for Evaluation and Treatment: Rehabilitation  ONSET DATE: 2022  SUBJECTIVE:   SUBJECTIVE STATEMENT: Patient reports yesterday  her knee was really hurting, states she was turning and her knee gave out. Patient states 3/10 pain in knee today, states just now when she stood up her knee gave a loud pop with pain. Patient states she has been tested for Wilson's disease by PCP and is awaiting all the results.   PERTINENT HISTORY: Lumbar surgery 2017, scoliosis PAIN:  Are you having pain? Yes: NPRS scale: 3/10 currently, 10/10 at worst/10 Pain location: Lt knee Pain description: sharp, unstable Aggravating factors: walk Relieving factors: meds  PRECAUTIONS: None  WEIGHT BEARING RESTRICTIONS: No  FALLS:  Has patient fallen in last 6 months? No  OCCUPATION: payroll  PLOF: Independent  PATIENT GOALS: decrease pain, return to normal walking routine  NEXT MD VISIT: 08/24/22  OBJECTIVE:   DIAGNOSTIC FINDINGS: x ray: Mild patellofemoral osteoarthritis. 1. Scoliotic curvature of the lumbar spine, apex to the left. 2. Pedicle rods and screws at L5-S1 without hardware failure. 3. Trace retrolisthesis of L3 versus L4 and L4 versus L5. 4. Degenerative disc disease as above.  PATIENT SURVEYS:  FOTO 52 08/19/22: 49   EDEMA:  None noted on eval   PALPATION: TTP with patella mobs all directions. TTP medial and lateral knee joint line  LOWER EXTREMITY ROM:  Active ROM Right eval Left eval  Hip flexion    Hip extension    Hip abduction    Hip adduction    Hip internal rotation    Hip external rotation    Knee flexion 142 125  Knee extension  Ankle dorsiflexion    Ankle plantarflexion    Ankle inversion    Ankle eversion     (Blank rows = not tested)  LOWER EXTREMITY MMT:  MMT Right eval Left eval  Hip flexion  4+ straight 3+ with ER  Hip extension  4-  Hip abduction  4  Hip adduction    Hip internal rotation    Hip external rotation    Knee flexion 5/5 4/5  Knee extension 4+/5 4/5  Ankle dorsiflexion    Ankle plantarflexion    Ankle inversion    Ankle eversion     (Blank rows = not  tested)   FUNCTIONAL TESTS:   SLS: Rt >15 sec Lt 3 sec  GAIT: Stairs: pt with some increased pain ascending stairs with Lt LE without UE support. Decreased Lt quad strength noted with descending stairs   TODAY'S TREATMENT:   [facility] Adult PT Treatment:                                                DATE: 08/19/2022 Therapeutic Exercise: Recumbent bike L5 x 6 min Seated: Hip add isometric ball squeeze 10x5" Unilateral clamshell GTB LAQ x15 GTB  Wall squat w/ball squeeze Hooklying ankle ball squeeze (lower leg add) + gait belt press (upper leg abd) --> added bridges Bridges + abd activation x10 --> add ball squeeze x10 VMO lunges with slider + GTB at medial knee Prone quad set (discontinued d/t pain) Standing squats + TKE with GTB Therapeutic Activity: Subjective intake + update on MD visits & testing (WD) FOTO intake   [facility] Adult PT Treatment:                                                DATE: 08/12/2022 Therapeutic Exercise: Recumbent bike L5 x 5 min Wall squat + ball squeeze pulses at bottom x10 Seated hip isometrics add & abd (blocking with hands) 5x5" each L LAQ x10 + VMO tapping BFR: 160 mmHg max pressure - 96 mmHg (60%) L SLR in ER (small range) 30x5" --> 30" rest --> 3x15 with 30" rest b/w sets Side monster walks with black super band VMO lunges    [facility] Adult PT Treatment:                                                DATE: 08/04/22 Therapeutic Exercise: Recumbent bike L3 x 5 Standing TKE 10 x 3 sec hold Side lunge x 10 bilat Lateral step down eccentric x 15 Hip 3 way on slider x 10 focus on Lt LE stance SLR in ER 2 x 10 Bridge with add squeeze 2 x 10 Bridge with add squeeze with alternating kicks x 10 Mini squat ball between knees x 10 with cues for form Wall squat x 10 Heel raise ball between heels x 20   PATIENT EDUCATION:  Education details: standing weight shifting A/P instead of lateral Person educated: Patient Education method: Explanation,  Demonstration, and Handouts Education comprehension: verbalized understanding and returned demonstration  HOME EXERCISE PROGRAM: Access Code: [email or web address] URL: [email or web address] Date: 08/19/2022 Prepared by: Samara Deist  Dennys Traughber  Exercises - Marching Bridge  - 1 x daily - 7 x weekly - 2 sets - 10 reps - Straight Leg Raise with External Rotation  - 1 x daily - 7 x weekly - 2 sets - 10 reps - Side Stepping with Resistance at Ankles  - 1 x daily - 7 x weekly - 3 sets - 10 reps - Single Leg Balance with Clock Reach  - 1 x daily - 7 x weekly - 3 sets - 10 reps - Supine Bridge with Mini Swiss Ball Between Knees  - 1 x daily - 7 x weekly - 3 sets - 10 reps - Standing Calf Raise With Small Ball at Heels  - 1 x daily - 7 x weekly - 3 sets - 10 reps - Wall Squat with Ball between Knees  - 1 x daily - 7 x weekly - 3 sets - 10 reps - Standing Terminal Knee Extension with Resistance  - 1 x daily - 7 x weekly - 3 sets - 10 reps - Standing Squat with Resisted Terminal Knee Extension  - 1 x daily - 7 x weekly - 3 sets - 10 reps - Sidelying Hip Adduction with Ankle Weight - Leg In Front  - 1 x daily - 7 x weekly - 3 sets - 10 reps  ASSESSMENT:  CLINICAL IMPRESSION: Medial knee strengthening continued with focus on hip add isometric activation during dridges and wall squats. Resistance band added with terminal knee extension and standing squats to decrease knee hyperextension and decrease pain.   OBJECTIVE IMPAIRMENTS: decreased activity tolerance, decreased balance, difficulty walking, decreased ROM, decreased strength, and pain.    GOALS: Goals reviewed with patient? Yes  SHORT TERM GOALS: Target date: 08/05/2022  Pt will be independent with initial HEP Baseline: Goal status: MET   LONG TERM GOALS: Target date: 09/02/2022  Pt will be independent with advanced HEP Baseline:  Goal status: INITIAL  2.  Pt will improve FOTO to >= 63 to demo improved functional mobility Baseline:   Goal status: IN PROGRESS 49%  3.  Pt will tolerate walking 3 miles with pain <= 2/10 Baseline:  Goal status: IN PROGRESS 5-7/10 pain  4.  Pt will improve Lt LE strength to 4+/5 to improve standing and walking tolerance Baseline:  Goal status: INITIAL   PLAN:  PT FREQUENCY: 1-2x/week  PT DURATION: 6 weeks  PLANNED INTERVENTIONS: Therapeutic exercises, Therapeutic activity, Neuromuscular re-education, Balance training, Gait training, Patient/Family education, Self Care, Joint mobilization, Stair training, Aquatic Therapy, Dry Needling, Electrical stimulation, Cryotherapy, Moist heat, Taping, Ultrasound, Ionotophoresis 4mg /ml Dexamethasone, Manual therapy, and Re-evaluation  PLAN FOR NEXT SESSION: Continue knee strength, balance, eccentrics   Sanjuana Mae, PTA 08/19/2022, 9:04 AM

## 2022-08-22 ENCOUNTER — Ambulatory Visit (INDEPENDENT_AMBULATORY_CARE_PROVIDER_SITE_OTHER): Payer: 59 | Admitting: Family Medicine

## 2022-08-22 VITALS — BP 129/68 | HR 86 | Ht 63.0 in

## 2022-08-22 DIAGNOSIS — E538 Deficiency of other specified B group vitamins: Secondary | ICD-10-CM

## 2022-08-22 MED ORDER — CYANOCOBALAMIN 1000 MCG/ML IJ SOLN
1000.0000 ug | Freq: Once | INTRAMUSCULAR | Status: AC
Start: 2022-08-22 — End: 2022-08-22
  Administered 2022-08-22: 1000 ug via INTRAMUSCULAR

## 2022-08-22 NOTE — Patient Instructions (Signed)
Return in 14 days for B12 injection as nurse visit.

## 2022-08-22 NOTE — Progress Notes (Signed)
   Established Patient Office Visit  Subjective   Patient ID: Megan [name], female    DOB: [birth year]  Age: 52 y.o. MRN: [id number]  Chief Complaint  Patient presents with   vitamin B12 deficiency    Vitamin B12 injection as nurse visit=kph    HPI  Vitamin B 12 deficiency-  B12 injection- nurse visit.  Patient denies  GI problems, weakness, irregular heart rate, palpitations or medication problems. Patient tolerated her initial b12 injection given on 08/08/22 well without complications.  ROS    Objective:     BP 129/68   Pulse 86   Ht 5\' 3"  (1.6 m)   SpO2 100%   BMI 33.30 kg/m    Physical Exam   No results found for any visits on 08/22/22.    The 10-year ASCVD risk score (Arnett DK, et al., 2019) is: 2.5%    Assessment & Plan:  B12 injection nurse visit- admin 1,085mcg cyanocobalamin IM Left deltoid=-patient tolerated injection well without complications. Return in 14 days for nurse visit for B12 injection. Problem List Items Addressed This Visit   None Visit Diagnoses     Vitamin B12 deficiency    -  Primary   Relevant Medications   cyanocobalamin (VITAMIN B12) injection 1,000 mcg (Completed) (Start on 08/22/2022  9:15 AM)       Return in about 2 weeks (around 09/05/2022) for nurse visit for B12 injection.Elizabeth Palau, LPN

## 2022-08-22 NOTE — Progress Notes (Signed)
Agree with documentation as above.   Hildy Nicholl, MD  

## 2022-08-24 ENCOUNTER — Ambulatory Visit: Payer: 59 | Admitting: Sports Medicine

## 2022-08-24 ENCOUNTER — Ambulatory Visit: Payer: 59 | Admitting: Family Medicine

## 2022-08-24 DIAGNOSIS — S83002A Unspecified subluxation of left patella, initial encounter: Secondary | ICD-10-CM | POA: Diagnosis not present

## 2022-08-24 LAB — HEPATIC FUNCTION PANEL
ALT: 20 IU/L (ref 0–32)
AST: 26 IU/L (ref 0–40)
Albumin: 3.8 g/dL — ABNORMAL LOW (ref 3.9–4.9)
Alkaline Phosphatase: 70 IU/L (ref 44–121)
Bilirubin Total: 0.4 mg/dL (ref 0.0–1.2)
Bilirubin, Direct: 0.1 mg/dL (ref 0.00–0.40)
Total Protein: 5.7 g/dL — ABNORMAL LOW (ref 6.0–8.5)

## 2022-08-24 LAB — CERULOPLASMIN: Ceruloplasmin: 46.7 mg/dL — ABNORMAL HIGH (ref 19.0–39.0)

## 2022-08-24 LAB — NGS CUSTOM PANEL

## 2022-08-24 LAB — COPPER, SERUM: Copper: 192 ug/dL — ABNORMAL HIGH (ref 80–158)

## 2022-08-24 NOTE — Progress Notes (Signed)
    Procedures performed today:    None.  Independent interpretation of notes and tests performed by another provider:   None.  Brief History, Exam, Impression, and Recommendations:    Patellar subluxation, left, initial encounter This is a pleasant 52 year old female, she has a long history of left knee pain, she has left knee patellofemoral osteoarthritis, she has had some injections the last of which was in March 2023, at the last visit she was having a bit of a new problem, patella felt like it was slipping, on exam she did have a positive patellar apprehension sign. We added aggressive physical therapy, x-rays, NSAIDs, unfortunately she still has the same problem, I do think she is probably a candidate for lateral release and medial plication, proceeding with MRI and a referral to Dr. Everardo Pacific.    ____________________________________________ Ihor Austin. Benjamin Stain, M.D., ABFM., CAQSM., AME. Primary Care and Sports Medicine [facility]  Adjunct Professor of Family Medicine  [employer]  Restaurant manager, fast food

## 2022-08-24 NOTE — Assessment & Plan Note (Signed)
This is a pleasant 52 year old female, she has a long history of left knee pain, she has left knee patellofemoral osteoarthritis, she has had some injections the last of which was in March 2023, at the last visit she was having a bit of a new problem, patella felt like it was slipping, on exam she did have a positive patellar apprehension sign. We added aggressive physical therapy, x-rays, NSAIDs, unfortunately she still has the same problem, I do think she is probably a candidate for lateral release and medial plication, proceeding with MRI and a referral to Dr. Everardo Pacific.

## 2022-08-26 ENCOUNTER — Ambulatory Visit: Payer: 59 | Admitting: Physical Therapy

## 2022-08-26 NOTE — Progress Notes (Signed)
Hi Soila, copper level came back at 192.  A little bit less than it was a month ago.  Ceruloplasmin is also elevated.  This helps transport copper in the blood.  Can also be elevated for inflammation.  Still waiting on the final report.  It says complete PDF to be sent separately and I have not seen it come through so we may have to call and check on this to see how much longer it may take for them to get [place] that report.  Call lab and see how long it may take to the get that gene sequencing that we ordered on 4/1.  It has been a little over 2 weeks and it just says will follow.  Just want to have an expectation for the patient not sure if this 1 takes maybe 4 weeks?

## 2022-08-29 ENCOUNTER — Encounter: Payer: Self-pay | Admitting: Sports Medicine

## 2022-09-02 ENCOUNTER — Encounter: Payer: Self-pay | Admitting: Family Medicine

## 2022-09-02 ENCOUNTER — Encounter: Payer: 59 | Admitting: Physical Therapy

## 2022-09-03 ENCOUNTER — Ambulatory Visit (INDEPENDENT_AMBULATORY_CARE_PROVIDER_SITE_OTHER): Payer: 59

## 2022-09-03 DIAGNOSIS — S83002A Unspecified subluxation of left patella, initial encounter: Secondary | ICD-10-CM | POA: Diagnosis not present

## 2022-09-05 ENCOUNTER — Ambulatory Visit (INDEPENDENT_AMBULATORY_CARE_PROVIDER_SITE_OTHER): Payer: 59 | Admitting: Family Medicine

## 2022-09-05 VITALS — BP 129/68 | HR 89 | Ht 63.0 in

## 2022-09-05 DIAGNOSIS — E538 Deficiency of other specified B group vitamins: Secondary | ICD-10-CM

## 2022-09-05 LAB — HM MAMMOGRAPHY

## 2022-09-05 MED ORDER — CYANOCOBALAMIN 1000 MCG/ML IJ SOLN
1000.0000 ug | Freq: Once | INTRAMUSCULAR | Status: AC
Start: 2022-09-05 — End: 2022-09-05
  Administered 2022-09-05: 1000 ug via INTRAMUSCULAR

## 2022-09-05 NOTE — Patient Instructions (Signed)
Return in 14 days for nurse visit for B12 injection.

## 2022-09-05 NOTE — Progress Notes (Signed)
   Established Patient Office Visit  Subjective   Patient ID: Megan [name], female    DOB: July 06, 1970  Age: 52 y.o. MRN: [id number]  Chief Complaint  Patient presents with   B12 deficiency    Nurse visit B12 injection.    HPI  B12 deficiency nurse visit. Patient denies GI problems, weakness, irregular HR, palpitations or problems with medications.   ROS    Objective:     BP 129/68   Pulse 89   Ht 5\' 3"  (1.6 m)   SpO2 100%   BMI 33.30 kg/m    Physical Exam   No results found for any visits on 09/05/22.    The 10-year ASCVD risk score (Arnett DK, et al., 2019) is: 2.5%    Assessment & Plan:  B12 injection- admin 1,047mcg IM Right deltoid. Patient tolerated injection well without complications. Return in 14 days for nurse visit for B12 injection. Problem List Items Addressed This Visit   None   No follow-ups on file.    Elizabeth Palau, LPN

## 2022-09-05 NOTE — Progress Notes (Signed)
Agree with documentation as above.   Guneet Delpino, MD  

## 2022-09-16 ENCOUNTER — Ambulatory Visit (INDEPENDENT_AMBULATORY_CARE_PROVIDER_SITE_OTHER): Payer: 59 | Admitting: Family Medicine

## 2022-09-16 ENCOUNTER — Encounter: Payer: Self-pay | Admitting: Family Medicine

## 2022-09-16 VITALS — BP 115/59 | HR 87 | Ht 63.0 in | Wt 184.0 lb

## 2022-09-16 DIAGNOSIS — E538 Deficiency of other specified B group vitamins: Secondary | ICD-10-CM

## 2022-09-16 MED ORDER — CYANOCOBALAMIN 1000 MCG/ML IJ SOLN
1000.0000 ug | Freq: Once | INTRAMUSCULAR | Status: AC
Start: 2022-09-16 — End: 2022-09-16
  Administered 2022-09-16: 1000 ug via INTRAMUSCULAR

## 2022-09-16 NOTE — Progress Notes (Signed)
Agree with documentation as above.   Hermie Reagor, MD  

## 2022-09-16 NOTE — Progress Notes (Signed)
Pt came in today for B12 injection.   Denies any dizziness, chest pain or palpitations, and no GI problems.  Pt reports no negative side effects from medication.    Injection given in LD pt tolerated well. She will RTC in 2 weeks for next injection

## 2022-09-19 ENCOUNTER — Ambulatory Visit: Payer: 59

## 2022-09-19 ENCOUNTER — Other Ambulatory Visit (HOSPITAL_BASED_OUTPATIENT_CLINIC_OR_DEPARTMENT_OTHER): Payer: Self-pay

## 2022-09-19 MED ORDER — TRAMADOL HCL 50 MG PO TABS
50.0000 mg | ORAL_TABLET | Freq: Four times a day (QID) | ORAL | 0 refills | Status: DC | PRN
  Filled 2022-09-19: qty 20, 5d supply, fill #0

## 2022-09-19 MED ORDER — ACETAMINOPHEN 500 MG PO TABS
1000.0000 mg | ORAL_TABLET | Freq: Three times a day (TID) | ORAL | 0 refills | Status: DC
  Filled 2022-09-19: qty 100, 17d supply, fill #0

## 2022-09-19 MED ORDER — MELOXICAM 7.5 MG PO TABS
7.5000 mg | ORAL_TABLET | Freq: Two times a day (BID) | ORAL | 0 refills | Status: DC
  Filled 2022-09-19: qty 60, 30d supply, fill #0

## 2022-09-19 MED ORDER — ASPIRIN 81 MG PO CHEW
81.0000 mg | CHEWABLE_TABLET | Freq: Two times a day (BID) | ORAL | 0 refills | Status: DC
  Filled 2022-09-19: qty 90, 45d supply, fill #0

## 2022-09-19 MED ORDER — ONDANSETRON HCL 4 MG PO TABS
4.0000 mg | ORAL_TABLET | Freq: Four times a day (QID) | ORAL | 0 refills | Status: DC | PRN
  Filled 2022-09-19: qty 10, 3d supply, fill #0

## 2022-09-29 ENCOUNTER — Telehealth: Payer: Self-pay

## 2022-09-29 NOTE — Telephone Encounter (Signed)
Patient scheduled for nurse visit tomorrow. Has been getting injections every 14 days - in lab notes recommended recheck in 8 weeks for oral B12 - did you also want to recheck in 8 weeks on the  injection ? If so, she would be due tomorrow.

## 2022-09-29 NOTE — Telephone Encounter (Signed)
Yes let have lab draw and then we can give her shot right after. Thank you KiM!!!!

## 2022-09-30 ENCOUNTER — Ambulatory Visit (INDEPENDENT_AMBULATORY_CARE_PROVIDER_SITE_OTHER): Payer: 59 | Admitting: Family Medicine

## 2022-09-30 ENCOUNTER — Other Ambulatory Visit: Payer: Self-pay

## 2022-09-30 VITALS — BP 117/65 | HR 87 | Ht 63.0 in

## 2022-09-30 DIAGNOSIS — E538 Deficiency of other specified B group vitamins: Secondary | ICD-10-CM

## 2022-09-30 MED ORDER — CYANOCOBALAMIN 1000 MCG/ML IJ SOLN
1000.0000 ug | Freq: Once | INTRAMUSCULAR | Status: AC
Start: 2022-09-30 — End: 2022-09-30
  Administered 2022-09-30: 1000 ug via INTRAMUSCULAR

## 2022-09-30 NOTE — Progress Notes (Signed)
   Established Patient Office Visit  Subjective   Patient ID: Megan [name], female    DOB: [birth year]  Age: 52 y.o. MRN: [id number]  Chief Complaint  Patient presents with   vitamin B12 deficiency    B12 injection nurse visit.     HPI  Vitamin B12 injection - nurse visit. Patient denies weakness , irregular heart rate of problems with medicaiton. Patient will have lab work drawn before B12 injection given today.   ROS    Objective:     BP 117/65   Pulse 87   Ht 5\' 3"  (1.6 m)   SpO2 100%   BMI 32.59 kg/m    Physical Exam   No results found for any visits on 09/30/22.    The 10-year ASCVD risk score (Arnett DK, et al., 2019) is: 2.1%    Assessment & Plan:  B12 injection nurse visit. Admin 1,081mcg IM Right deltoid. Patient tolerated injection well without complications.  Problem List Items Addressed This Visit   None Visit Diagnoses     Vitamin B12 deficiency    -  Primary   Relevant Medications   cyanocobalamin (VITAMIN B12) injection 1,000 mcg (Completed)       No follow-ups on file.    Elizabeth Palau, LPN

## 2022-09-30 NOTE — Progress Notes (Signed)
Agree with documentation as above.   Long Brimage, MD  

## 2022-10-01 LAB — VITAMIN B12: Vitamin B-12: 430 pg/mL (ref 200–1100)

## 2022-10-04 NOTE — Progress Notes (Signed)
Hi Megan [name], B12 looks much better compared to 2 months ago.  K to space the B12 injections to monthly and then we will plan to recheck level again in 3 to 4 months.

## 2022-10-26 ENCOUNTER — Ambulatory Visit: Payer: 59 | Admitting: Family Medicine

## 2022-10-31 ENCOUNTER — Other Ambulatory Visit (HOSPITAL_BASED_OUTPATIENT_CLINIC_OR_DEPARTMENT_OTHER): Payer: Self-pay

## 2022-10-31 ENCOUNTER — Ambulatory Visit: Payer: 59 | Admitting: Family Medicine

## 2022-10-31 VITALS — BP 123/68 | HR 85 | Ht 61.0 in | Wt 184.0 lb

## 2022-10-31 DIAGNOSIS — E1129 Type 2 diabetes mellitus with other diabetic kidney complication: Secondary | ICD-10-CM

## 2022-10-31 DIAGNOSIS — R809 Proteinuria, unspecified: Secondary | ICD-10-CM

## 2022-10-31 DIAGNOSIS — E538 Deficiency of other specified B group vitamins: Secondary | ICD-10-CM | POA: Diagnosis not present

## 2022-10-31 DIAGNOSIS — E119 Type 2 diabetes mellitus without complications: Secondary | ICD-10-CM

## 2022-10-31 DIAGNOSIS — Z23 Encounter for immunization: Secondary | ICD-10-CM | POA: Diagnosis not present

## 2022-10-31 DIAGNOSIS — Z7689 Persons encountering health services in other specified circumstances: Secondary | ICD-10-CM

## 2022-10-31 DIAGNOSIS — Z794 Long term (current) use of insulin: Secondary | ICD-10-CM

## 2022-10-31 LAB — POCT UA - MICROALBUMIN
Albumin/Creatinine Ratio, Urine, POC: <30
Creatinine, POC: 300 mg/dL
Microalbumin Ur, POC: 30 mg/L

## 2022-10-31 LAB — POCT GLYCOSYLATED HEMOGLOBIN (HGB A1C): Hemoglobin A1C: 5.3 % (ref 4.0–5.6)

## 2022-10-31 MED ORDER — CYANOCOBALAMIN 1000 MCG/ML IJ SOLN
1000.0000 ug | Freq: Once | INTRAMUSCULAR | Status: AC
Start: 2022-10-31 — End: 2022-10-31
  Administered 2022-10-31: 1000 ug via INTRAMUSCULAR

## 2022-10-31 MED ORDER — PHENTERMINE HCL 15 MG PO CAPS
15.0000 mg | ORAL_CAPSULE | ORAL | 1 refills | Status: DC
  Filled 2022-10-31: qty 30, 30d supply, fill #0
  Filled 2023-03-13: qty 30, 30d supply, fill #1

## 2022-10-31 NOTE — Assessment & Plan Note (Signed)
12 injection given today follow-up in 1 month for nurse visit for repeat injection.

## 2022-10-31 NOTE — Progress Notes (Signed)
Pt came in today for B12 injection. Injection  given in RD she tolerated well.  Pt reports no negative side effects from medication. Denies any dizziness, chest pain or palpitations, and no GI problems..  RTC in 1 month for next injection

## 2022-10-31 NOTE — Patient Instructions (Signed)
B12 in 1 month

## 2022-10-31 NOTE — Assessment & Plan Note (Signed)
We did strategies around her weight loss especially since right now she cannot walk and do her regular cardio.  Discussed the importance of doing resistance training.  Only do any resistance on that left knee with the approval of her physical therapist.  But otherwise she can do her right leg and her upper body.  She has access to a pool so that will be great exercise.  Also making sure getting enough protein in the diet to help build muscle and maintain it especially while on semaglutide.  We also discussed maybe adding a low-dose of phentermine for 1 to 2 months while she is not able to be as active to help control appetite further with the idea of tapering off when she is able to get moving again.

## 2022-10-31 NOTE — Assessment & Plan Note (Signed)
No evidence of microalbuminuria today with excellent A1c.  Will continue to monitor.

## 2022-10-31 NOTE — Assessment & Plan Note (Signed)
A1c looks fantastic today at 5.3.  Continue to monitor.

## 2022-10-31 NOTE — Progress Notes (Signed)
Established Patient Office Visit  Subjective   Patient ID: Megan [name], female    DOB: May 23, 1970  Age: 52 y.o. MRN: [id number]  Chief Complaint  Patient presents with   Diabetes   B12 Injection    HPI Since I last saw her she had left knee surgery.  They ended up having to do a little bit more repair and cleaning the knee out.  It is she is about 6 weeks out and has started formal physical therapy.  But she has been stressed because she has been gaining some weight.  She was really doing great with her walking and exercise up until surgery.  She says she is put back on some weight and that is been really stressful.  Diabetes - no hypoglycemic events. No wounds or sores that are not healing well. No increased thirst or urination. Checking glucose at home. Taking medications as prescribed without any side effects.  He also wanted me to take a look at a mole on her right upper back.  It is occasionally itchy.  It has been there for years.    ROS    Objective:     BP 123/68   Pulse 85   Ht 5\' 1"  (1.549 m)   Wt 184 lb (83.5 kg)   SpO2 100%   BMI 34.77 kg/m    Physical Exam   Results for orders placed or performed in visit on 10/31/22  POCT glycosylated hemoglobin (Hb A1C)  Result Value Ref Range   Hemoglobin A1C 5.3 4.0 - 5.6 %   HbA1c POC (<> result, manual entry)     HbA1c, POC (prediabetic range)     HbA1c, POC (controlled diabetic range)    POCT UA - Microalbumin  Result Value Ref Range   Microalbumin Ur, POC 30 mg/L   Creatinine, POC 300 mg/dL   Albumin/Creatinine Ratio, Urine, POC <30       The 10-year ASCVD risk score (Arnett DK, et al., 2019) is: 2.3%    Assessment & Plan:   Problem List Items Addressed This Visit       Endocrine   Microalbuminuria due to type 2 diabetes mellitus (HCC)    No evidence of microalbuminuria today with excellent A1c.  Will continue to monitor.      Diabetes type 2, controlled (HCC) - Primary    A1c looks fantastic  today at 5.3.  Continue to monitor.      Relevant Orders   POCT glycosylated hemoglobin (Hb A1C) (Completed)   POCT UA - Microalbumin (Completed)     Other   Vitamin B12 deficiency    12 injection given today follow-up in 1 month for nurse visit for repeat injection.      Encounter for weight management    We did strategies around her weight loss especially since right now she cannot walk and do her regular cardio.  Discussed the importance of doing resistance training.  Only do any resistance on that left knee with the approval of her physical therapist.  But otherwise she can do her right leg and her upper body.  She has access to a pool so that will be great exercise.  Also making sure getting enough protein in the diet to help build muscle and maintain it especially while on semaglutide.  We also discussed maybe adding a low-dose of phentermine for 1 to 2 months while she is not able to be as active to help control appetite further with the idea of  tapering off when she is able to get moving again.      Other Visit Diagnoses     Encounter for immunization       Relevant Orders   Tdap vaccine greater than or equal to 7yo IM (Completed)       Return in about 1 month (around 11/30/2022) for B12.    Nani Gasser, MD

## 2022-11-28 ENCOUNTER — Ambulatory Visit (INDEPENDENT_AMBULATORY_CARE_PROVIDER_SITE_OTHER): Payer: 59 | Admitting: Family Medicine

## 2022-11-28 VITALS — BP 139/74 | HR 72

## 2022-11-28 DIAGNOSIS — E538 Deficiency of other specified B group vitamins: Secondary | ICD-10-CM

## 2022-11-28 MED ORDER — CYANOCOBALAMIN 1000 MCG/ML IJ SOLN
1000.0000 ug | Freq: Once | INTRAMUSCULAR | Status: AC
Start: 2022-11-28 — End: 2022-11-28
  Administered 2022-11-28: 1000 ug via INTRAMUSCULAR

## 2022-11-28 NOTE — Progress Notes (Signed)
Agree with documentation as above.   Catherine Metheney, MD  

## 2022-11-28 NOTE — Progress Notes (Signed)
   Established Patient Office Visit  Subjective   Patient ID: Megan [name], female    DOB: [birth year]  Age: 52 y.o. MRN: [id number]  Chief Complaint  Patient presents with   Pernicious Anemia    HPI  Megan [name] is here for a vitamin B 12 injection. Denies muscle cramps, weakness or irregular heart rate.   ROS    Objective:     BP 139/74   Pulse 72   SpO2 100%    Physical Exam   No results found for any visits on 11/28/22.    The 10-year ASCVD risk score (Arnett DK, et al., 2019) is: 2.9%    Assessment & Plan:  B12 injection - Patient tolerated injection well without complications. Patient advised to schedule next injection 30 days from today.    Problem List Items Addressed This Visit       Unprioritized   Vitamin B12 deficiency - Primary    Return in about 30 days (around 12/28/2022) for B12 injection. Earna Coder, Janalyn Harder, CMA

## 2022-12-14 ENCOUNTER — Encounter: Payer: Self-pay | Admitting: Sports Medicine

## 2022-12-18 ENCOUNTER — Other Ambulatory Visit: Payer: Self-pay | Admitting: Family Medicine

## 2022-12-18 DIAGNOSIS — E119 Type 2 diabetes mellitus without complications: Secondary | ICD-10-CM

## 2022-12-19 ENCOUNTER — Other Ambulatory Visit (HOSPITAL_BASED_OUTPATIENT_CLINIC_OR_DEPARTMENT_OTHER): Payer: Self-pay

## 2022-12-19 MED ORDER — OZEMPIC (2 MG/DOSE) 8 MG/3ML ~~LOC~~ SOPN
2.0000 mg | PEN_INJECTOR | SUBCUTANEOUS | 2 refills | Status: DC
Start: 2022-12-19 — End: 2023-08-15
  Filled 2022-12-19: qty 9, 84d supply, fill #0
  Filled 2023-03-13: qty 9, 84d supply, fill #1
  Filled 2023-05-31: qty 9, 84d supply, fill #2

## 2022-12-23 ENCOUNTER — Other Ambulatory Visit (HOSPITAL_BASED_OUTPATIENT_CLINIC_OR_DEPARTMENT_OTHER): Payer: Self-pay

## 2022-12-23 ENCOUNTER — Other Ambulatory Visit: Payer: Self-pay

## 2022-12-23 DIAGNOSIS — E538 Deficiency of other specified B group vitamins: Secondary | ICD-10-CM

## 2022-12-26 ENCOUNTER — Ambulatory Visit (INDEPENDENT_AMBULATORY_CARE_PROVIDER_SITE_OTHER): Payer: 59 | Admitting: Family Medicine

## 2022-12-26 VITALS — BP 116/64 | HR 81 | Wt 189.0 lb

## 2022-12-26 DIAGNOSIS — E538 Deficiency of other specified B group vitamins: Secondary | ICD-10-CM

## 2022-12-26 MED ORDER — CYANOCOBALAMIN 1000 MCG/ML IJ SOLN
1000.0000 ug | Freq: Once | INTRAMUSCULAR | Status: AC
Start: 2022-12-26 — End: 2022-12-26
  Administered 2022-12-26: 1000 ug via INTRAMUSCULAR

## 2022-12-26 NOTE — Progress Notes (Signed)
   Established Patient Office Visit  Subjective   Patient ID: Megan [name], female    DOB: 08-21-1970  Age: 52 y.o. MRN: [id number]  No chief complaint on file.   HPI  Megan [name] is here for a vitamin B 12 injection. Denies muscle cramps, weakness or irregular heart rate.   ROS    Objective:     BP 116/64   Pulse 81   Wt 189 lb (85.7 kg)   SpO2 100%   BMI 35.71 kg/m    Physical Exam   No results found for any visits on 12/26/22.    The 10-year ASCVD risk score (Arnett DK, et al., 2019) is: 2.2%    Assessment & Plan:  B12 injection - Patient tolerated injection well without complications. Patient advised to schedule next injection 30 days from today.    Problem List Items Addressed This Visit       Unprioritized   Vitamin B12 deficiency - Primary    Return in about 1 month (around 01/26/2023) for B12 injection. Earna Coder, Janalyn Harder, CMA

## 2022-12-26 NOTE — Progress Notes (Signed)
Agree with documentation as above.   Catherine Metheney, MD  

## 2022-12-27 LAB — VITAMIN B12: Vitamin B-12: 374 pg/mL (ref 232–1245)

## 2022-12-27 NOTE — Progress Notes (Signed)
Marguerette, your B12 looks good.  It is not quite as high as it was 2 months ago.  Lets continue with every 30-day injection.  I think that is working well overall.

## 2023-01-23 ENCOUNTER — Ambulatory Visit: Payer: 59

## 2023-01-26 ENCOUNTER — Ambulatory Visit (INDEPENDENT_AMBULATORY_CARE_PROVIDER_SITE_OTHER): Payer: 59

## 2023-01-26 VITALS — BP 116/72 | HR 92 | Ht 61.0 in

## 2023-01-26 DIAGNOSIS — E538 Deficiency of other specified B group vitamins: Secondary | ICD-10-CM

## 2023-01-26 MED ORDER — CYANOCOBALAMIN 1000 MCG/ML IJ SOLN
1000.0000 ug | Freq: Once | INTRAMUSCULAR | Status: AC
Start: 2023-01-26 — End: 2023-01-26
  Administered 2023-01-26: 1000 ug via INTRAMUSCULAR

## 2023-01-26 NOTE — Progress Notes (Signed)
Established Patient Office Visit  Subjective   Patient ID: NYIESHA EMMITT, female    DOB: 1970/05/23  Age: 52 y.o. MRN: [id number]  Chief Complaint  Patient presents with   Vitamin B12 deficiency    B12 injection nurse visit.    HPI  Vitamin B12 deficiency-B12 injection nurse visit. Patient denies problems with medicaiton.   ROS    Objective:     BP 116/72   Pulse 92   Ht 5\' 1"  (1.549 m)   SpO2 98%   BMI 35.71 kg/m    Physical Exam   No results found for any visits on 01/26/23.    The 10-year ASCVD risk score (Arnett DK, et al., 2019) is: 2.2%    Assessment & Plan:  B12 injection admin 1,054mcg IM Left deltoid. Patient tolerated injection well without complications. Return in 30 days for nurse visit for B12 injection.  Problem List Items Addressed This Visit       Other   Vitamin B12 deficiency - Primary    Return in about 1 month (around 02/25/2023) for B12 injection.    Elizabeth Palau, LPN

## 2023-01-26 NOTE — Patient Instructions (Signed)
Return in 30 days for B12 injection.

## 2023-02-06 ENCOUNTER — Ambulatory Visit: Payer: 59 | Admitting: Family Medicine

## 2023-02-09 ENCOUNTER — Ambulatory Visit: Payer: 59 | Admitting: Family Medicine

## 2023-02-23 ENCOUNTER — Ambulatory Visit (INDEPENDENT_AMBULATORY_CARE_PROVIDER_SITE_OTHER): Payer: 59

## 2023-02-23 VITALS — BP 126/79 | HR 95 | Ht 61.0 in

## 2023-02-23 DIAGNOSIS — E538 Deficiency of other specified B group vitamins: Secondary | ICD-10-CM

## 2023-02-23 MED ORDER — CYANOCOBALAMIN 1000 MCG/ML IJ SOLN
1000.0000 ug | Freq: Once | INTRAMUSCULAR | Status: AC
Start: 2023-02-23 — End: 2023-02-23
  Administered 2023-02-23: 1000 ug via INTRAMUSCULAR

## 2023-02-23 NOTE — Patient Instructions (Signed)
Return in 30 days for next B-12 injection

## 2023-02-23 NOTE — Progress Notes (Signed)
Established Patient Office Visit  Subjective   Patient ID: Megan [name], female    DOB: [birth year]  Age: 52 y.o. MRN: [id number]  Chief Complaint  Patient presents with   vitamin B12 deficiency    B12 injection nurse visit.     HPI  Vitamin B12 deficiency- B12 injection nurse visit. Patient denies Irregular heartrate, GI problems or muscle cramps.   ROS    Objective:     BP 126/79   Pulse 95   Ht 5\' 1"  (1.549 m)   SpO2 100%   BMI 35.71 kg/m    Physical Exam   No results found for any visits on 02/23/23.    The 10-year ASCVD risk score (Arnett DK, et al., 2019) is: 2.6%    Assessment & Plan:  B12 injection. Cyanocobalamin 1,067mcg admin Right deltoid IM . Patient tolerated injection well without complication. Return in 30 days for next B12 injection.  Problem List Items Addressed This Visit       Other   Vitamin B12 deficiency - Primary    Return in about 30 days (around 03/25/2023) for nurse visit for B12 injection. Elizabeth Palau, LPN

## 2023-03-01 ENCOUNTER — Encounter: Payer: Self-pay | Admitting: Family Medicine

## 2023-03-01 ENCOUNTER — Ambulatory Visit: Payer: 59 | Admitting: Family Medicine

## 2023-03-01 VITALS — BP 130/72 | HR 93 | Ht 61.0 in | Wt 191.0 lb

## 2023-03-01 DIAGNOSIS — I10 Essential (primary) hypertension: Secondary | ICD-10-CM | POA: Diagnosis not present

## 2023-03-01 DIAGNOSIS — E119 Type 2 diabetes mellitus without complications: Secondary | ICD-10-CM

## 2023-03-01 DIAGNOSIS — R790 Abnormal level of blood mineral: Secondary | ICD-10-CM | POA: Diagnosis not present

## 2023-03-01 DIAGNOSIS — I7300 Raynaud's syndrome without gangrene: Secondary | ICD-10-CM

## 2023-03-01 DIAGNOSIS — Z7985 Long-term (current) use of injectable non-insulin antidiabetic drugs: Secondary | ICD-10-CM

## 2023-03-01 DIAGNOSIS — E538 Deficiency of other specified B group vitamins: Secondary | ICD-10-CM

## 2023-03-01 LAB — POCT GLYCOSYLATED HEMOGLOBIN (HGB A1C): Hemoglobin A1C: 5.5 % (ref 4.0–5.6)

## 2023-03-01 NOTE — Assessment & Plan Note (Signed)
Trying to minimize exposure in diet.  REcheck levels today.

## 2023-03-01 NOTE — Progress Notes (Signed)
Established Patient Office Visit  Subjective   Patient ID: Megan [name], female    DOB: 01-17-1971  Age: 52 y.o. MRN: [id number]  Chief Complaint  Patient presents with   Diabetes    HPI  Hypertension- Pt denies chest pain, SOB, dizziness, or heart palpitations.  Taking meds as directed w/o problems.  Denies medication side effects.    Diabetes - no hypoglycemic events. No wounds or sores that are not healing well. No increased thirst or urination. Checking glucose at home. Taking medications as prescribed without any side effects. Was walking 3 miles a day but hasn't been able to walk as much with her job but trying to tget some walking in.    F/U elevated copper levels.  Gene testing negative. Due to recheck labs.   B12 def- last injection was 7 days ago.  Has felt really tired the last 2 weeks but stress levels have been high as well.     ROS    Objective:     BP 130/72   Pulse 93   Ht 5\' 1"  (1.549 m)   Wt 191 lb (86.6 kg)   SpO2 100%   BMI 36.09 kg/m    Physical Exam Vitals and nursing note reviewed.  Constitutional:      Appearance: Normal appearance.  HENT:     Head: Normocephalic and atraumatic.  Eyes:     Conjunctiva/sclera: Conjunctivae normal.  Cardiovascular:     Rate and Rhythm: Normal rate and regular rhythm.  Pulmonary:     Effort: Pulmonary effort is normal.     Breath sounds: Normal breath sounds.  Skin:    General: Skin is warm and dry.  Neurological:     Mental Status: She is alert.  Psychiatric:        Mood and Affect: Mood normal.      Results for orders placed or performed in visit on 03/01/23  POCT HgB A1C  Result Value Ref Range   Hemoglobin A1C 5.5 4.0 - 5.6 %   HbA1c POC (<> result, manual entry)     HbA1c, POC (prediabetic range)     HbA1c, POC (controlled diabetic range)        The 10-year ASCVD risk score (Arnett DK, et al., 2019) is: 2.8%    Assessment & Plan:   Problem List Items Addressed This Visit        Cardiovascular and Mediastinum   Raynaud's disease, idiopathic   Essential hypertension, benign - Primary    Well controlled. Continue current regimen. Follow up in  6 mo       Relevant Orders   CMP14+EGFR     Endocrine   Diabetes type 2, controlled (HCC)    Well controlled. Continue current regimen. Follow up in  4-6 months.       Relevant Orders   CMP14+EGFR   POCT HgB A1C (Completed)     Other   Vitamin B12 deficiency    On monthly injections. Will check with labs today.        Relevant Orders   B12   High serum copper level    Trying to minimize exposure in diet.  REcheck levels today.       Relevant Orders   Copper, serum   Ceruloplasmin    Return in about 6 months (around 08/30/2023).    Nani Gasser, MD

## 2023-03-01 NOTE — Assessment & Plan Note (Signed)
Well controlled. Continue current regimen. Follow up in 4-6 months.  

## 2023-03-01 NOTE — Assessment & Plan Note (Signed)
On monthly injections. Will check with labs today.

## 2023-03-01 NOTE — Assessment & Plan Note (Signed)
Well controlled. Continue current regimen. Follow up in  6 mo  

## 2023-03-02 NOTE — Progress Notes (Signed)
HI Megan [name], your albumin looks better. B12 looks good.  Copper is still pending.

## 2023-03-03 LAB — CMP14+EGFR
ALT: 18 [IU]/L (ref 0–32)
AST: 19 [IU]/L (ref 0–40)
Albumin: 3.9 g/dL (ref 3.9–4.9)
Alkaline Phosphatase: 90 [IU]/L (ref 44–121)
BUN/Creatinine Ratio: 16 (ref 9–23)
BUN: 13 mg/dL (ref 6–24)
Bilirubin Total: 0.6 mg/dL (ref 0.0–1.2)
CO2: 22 mmol/L (ref 20–29)
Calcium: 9 mg/dL (ref 8.7–10.2)
Chloride: 103 mmol/L (ref 96–106)
Creatinine, Ser: 0.81 mg/dL (ref 0.57–1.00)
Globulin, Total: 2 g/dL (ref 1.5–4.5)
Glucose: 80 mg/dL (ref 70–99)
Potassium: 3.9 mmol/L (ref 3.5–5.2)
Sodium: 142 mmol/L (ref 134–144)
Total Protein: 5.9 g/dL — ABNORMAL LOW (ref 6.0–8.5)
eGFR: 88 mL/min/{1.73_m2} (ref >59)

## 2023-03-03 LAB — COPPER, SERUM: Copper: 194 ug/dL — ABNORMAL HIGH (ref 80–158)

## 2023-03-03 LAB — VITAMIN B12: Vitamin B-12: 641 pg/mL (ref 232–1245)

## 2023-03-03 LAB — CERULOPLASMIN: Ceruloplasmin: 47.5 mg/dL — ABNORMAL HIGH (ref 19.0–39.0)

## 2023-03-03 NOTE — Progress Notes (Signed)
Copper level is similar to what it was 6 months ago so again just a little bit better than 7 months ago but it stable.

## 2023-03-23 ENCOUNTER — Ambulatory Visit: Payer: 59 | Admitting: Family Medicine

## 2023-03-23 VITALS — BP 125/77 | HR 85 | Resp 20

## 2023-03-23 DIAGNOSIS — E538 Deficiency of other specified B group vitamins: Secondary | ICD-10-CM | POA: Diagnosis not present

## 2023-03-23 MED ORDER — CYANOCOBALAMIN 1000 MCG/ML IJ SOLN
1000.0000 ug | Freq: Once | INTRAMUSCULAR | Status: AC
Start: 2023-03-23 — End: 2023-03-23
  Administered 2023-03-23: 1000 ug via INTRAMUSCULAR

## 2023-03-23 NOTE — Progress Notes (Signed)
Patient here for vitamin B 12 injection. Denies muscle cramps, weakness or irregular heart rate.  Location: Left Deltoid

## 2023-03-23 NOTE — Progress Notes (Signed)
Agree with documentation as above.   Teonna Coonan, MD  

## 2023-04-24 ENCOUNTER — Ambulatory Visit (INDEPENDENT_AMBULATORY_CARE_PROVIDER_SITE_OTHER): Payer: 59

## 2023-04-24 DIAGNOSIS — E538 Deficiency of other specified B group vitamins: Secondary | ICD-10-CM

## 2023-04-24 MED ORDER — CYANOCOBALAMIN 1000 MCG/ML IJ SOLN
1000.0000 ug | Freq: Once | INTRAMUSCULAR | Status: AC
  Administered 2023-04-24: 1000 ug via INTRAMUSCULAR

## 2023-04-24 NOTE — Progress Notes (Signed)
   Established Patient Office Visit  Subjective   Patient ID: Megan [name], female    DOB: 03-11-1971  Age: 52 y.o. MRN: [id number]  Chief Complaint  Patient presents with   Pernicious Anemia    HPI  Megan [name] is here for a vitamin B 12 injection. Denies muscle cramps, weakness or irregular heart rate.   ROS    Objective:     There were no vitals taken for this visit.   Physical Exam   No results found for any visits on 04/24/23.    The 10-year ASCVD risk score (Arnett DK, et al., 2019) is: 2.6%    Assessment & Plan:  B12 - Patient tolerated injection well without complications. Patient advised to schedule next injection 30 days from today.    Problem List Items Addressed This Visit       Unprioritized   Vitamin B12 deficiency - Primary    Return in about 1 month (around 05/25/2023) for B12 injection. Earna Coder, Janalyn Harder, CMA

## 2023-05-12 ENCOUNTER — Other Ambulatory Visit: Payer: Self-pay | Admitting: Family Medicine

## 2023-05-12 ENCOUNTER — Encounter (HOSPITAL_BASED_OUTPATIENT_CLINIC_OR_DEPARTMENT_OTHER): Payer: Self-pay

## 2023-05-12 ENCOUNTER — Other Ambulatory Visit (HOSPITAL_BASED_OUTPATIENT_CLINIC_OR_DEPARTMENT_OTHER): Payer: Self-pay

## 2023-05-21 ENCOUNTER — Other Ambulatory Visit: Payer: Self-pay | Admitting: Family Medicine

## 2023-05-23 ENCOUNTER — Other Ambulatory Visit (HOSPITAL_BASED_OUTPATIENT_CLINIC_OR_DEPARTMENT_OTHER): Payer: Self-pay

## 2023-05-24 ENCOUNTER — Telehealth (HOSPITAL_BASED_OUTPATIENT_CLINIC_OR_DEPARTMENT_OTHER): Payer: Self-pay | Admitting: Orthopaedic Surgery

## 2023-05-24 NOTE — Telephone Encounter (Signed)
 Left message for patient to call and resch appointment

## 2023-05-25 ENCOUNTER — Ambulatory Visit (INDEPENDENT_AMBULATORY_CARE_PROVIDER_SITE_OTHER): Payer: 59 | Admitting: Family Medicine

## 2023-05-25 ENCOUNTER — Encounter: Payer: Self-pay | Admitting: Family Medicine

## 2023-05-25 VITALS — BP 116/80 | HR 81

## 2023-05-25 DIAGNOSIS — E538 Deficiency of other specified B group vitamins: Secondary | ICD-10-CM | POA: Diagnosis not present

## 2023-05-25 MED ORDER — CYANOCOBALAMIN 1000 MCG/ML IJ SOLN
1000.0000 ug | Freq: Once | INTRAMUSCULAR | Status: AC
  Administered 2023-05-25: 1000 ug via INTRAMUSCULAR

## 2023-05-25 NOTE — Progress Notes (Signed)
Injection was tolerated well by the patient. Given in left deltoid(See MAR for injection details)   Pt reports no significant gi  problems or that  she has any irregular heart rate  episodes, but does have a family hx of  father  with a-fib.

## 2023-06-01 ENCOUNTER — Other Ambulatory Visit: Payer: Self-pay | Admitting: Family Medicine

## 2023-06-01 DIAGNOSIS — I10 Essential (primary) hypertension: Secondary | ICD-10-CM

## 2023-06-14 ENCOUNTER — Ambulatory Visit (HOSPITAL_BASED_OUTPATIENT_CLINIC_OR_DEPARTMENT_OTHER): Payer: 59 | Admitting: Orthopaedic Surgery

## 2023-06-14 ENCOUNTER — Ambulatory Visit (HOSPITAL_BASED_OUTPATIENT_CLINIC_OR_DEPARTMENT_OTHER): Payer: 59

## 2023-06-14 DIAGNOSIS — G8929 Other chronic pain: Secondary | ICD-10-CM | POA: Diagnosis not present

## 2023-06-14 DIAGNOSIS — M25562 Pain in left knee: Secondary | ICD-10-CM

## 2023-06-14 DIAGNOSIS — M25362 Other instability, left knee: Secondary | ICD-10-CM

## 2023-06-14 NOTE — Progress Notes (Signed)
 Chief Complaint: Left knee pain     History of Present Illness:    Megan [name] is a 53 y.o. female presents today with ongoing left knee pain since May 2024.  She has previously had a meniscal debridement.  Since that time she has had swelling and pain in the knee.  She has previously had an injection as well as hyaluronic acid following surgery.  She did not get significant relief from this.  She is here today as her injection is worn off and she is hoping to discuss further treatment options.  She has pain with squatting and deep type of flexion activities.  She has pain going up and down steps.  She has trialed physical therapy as well as multiple injections.  She has been taking anti-inflammatories with some relief.  She is here today for further discussion.    PMH/PSH/Family History/Social History/Meds/Allergies:    Past Medical History:  Diagnosis Date   Gestational diabetes mellitus in childbirth    Past Surgical History:  Procedure Laterality Date   TONSILLECTOMY AND ADENOIDECTOMY     TYMPANOSTOMY TUBE PLACEMENT  1984   Social History   Socioeconomic History   Marital status: Single    Spouse name: Senora Lacson   Number of children: Not on file   Years of education: Not on file   Highest education level: Associate degree: academic program  Occupational History   Occupation: Asst Diplomatic Services Operational Officer    Comment: [employer]   Tobacco Use   Smoking status: Never   Smokeless tobacco: Never  Substance and Sexual Activity   Alcohol use: No   Drug use: No   Sexual activity: Yes    Partners: Male  Other Topics Concern   Not on file  Social History Narrative   2 caffeine  drinks per day. No regular exercise. She works full-time and is going to school.   Social Drivers of Corporate Investment Banker Strain: Low Risk  (02/27/2023)   Overall Financial Resource Strain (CARDIA)    Difficulty of Paying Living Expenses: Not very hard  Food Insecurity: No Food  Insecurity (02/27/2023)   Hunger Vital Sign    Worried About Running Out of Food in the Last Year: Never true    Ran Out of Food in the Last Year: Never true  Transportation Needs: No Transportation Needs (02/27/2023)   PRAPARE - Administrator, Civil Service (Medical): No    Lack of Transportation (Non-Medical): No  Physical Activity: Unknown (08/24/2022)   Exercise Vital Sign    Days of Exercise per Week: Patient declined    Minutes of Exercise per Session: Not on file  Stress: No Stress Concern Present (08/24/2022)   [employer] of Occupational Health - Occupational Stress Questionnaire    Feeling of Stress : Only a little  Social Connections: Socially Isolated (02/27/2023)   Social Connection and Isolation Panel [NHANES]    Frequency of Communication with Friends and Family: Once a week    Frequency of Social Gatherings with Friends and Family: Once a week    Attends Religious Services: Never    Database Administrator or Organizations: No    Attends Engineer, Structural: Not on file    Marital Status: Never married   Family History  Problem Relation Age of Onset   Hypertension Mother    Celiac disease Mother    Hypertension Father    Hypertension Sister    Hypertension Brother  Peripheral vascular disease Maternal Grandmother    Diabetes Paternal Aunt    Diabetes Maternal Uncle    Diabetes Paternal Aunt    Colon cancer Maternal Grandfather    Crohn's disease Cousin    Allergies  Allergen Reactions   Metformin  And Related Other (See Comments)    GI upset    Sertraline  Nausea Only    sweats   Current Outpatient Medications  Medication Sig Dispense Refill   acetaminophen  (TYLENOL ) 500 MG tablet Take 2 tablets (1,000 mg total) by mouth every 8 (eight) hours for pain. (Patient taking differently: Take 1,000 mg by mouth as needed for moderate pain (pain score 4-6) or mild pain (pain score 1-3).) 100 tablet 0   AMBULATORY NON FORMULARY MEDICATION  One touch ultra 2 glucose system  Use to test once daily as directed  Please schedule f/u appt 1 each 0   amLODipine  (NORVASC ) 5 MG tablet TAKE 1 TABLET BY MOUTH DAILY 90 tablet 3   atorvastatin  (LIPITOR) 40 MG tablet Take 1 tablet (40 mg total) by mouth daily. 90 tablet 3   losartan -hydrochlorothiazide  (HYZAAR) 100-12.5 MG tablet TAKE 1 TABLET BY MOUTH DAILY 90 tablet 3   naproxen  (NAPROSYN ) 500 MG tablet Take 1 tablet (500 mg total) by mouth 2 (two) times daily with a meal. (Patient taking differently: Take 500 mg by mouth as needed for moderate pain (pain score 4-6).) 60 tablet 3   norgestrel-ethinyl estradiol (LO/OVRAL,CRYSELLE) 0.3-30 MG-MCG tablet Take 1 tablet by mouth daily.     phentermine  15 MG capsule Take 1 capsule (15 mg total) by mouth every morning. 30 capsule 1   Semaglutide , 2 MG/DOSE, (OZEMPIC , 2 MG/DOSE,) 8 MG/3ML SOPN Inject 2 mg (0.75 ml) into the skin once a week. 9 mL 2   No current facility-administered medications for this visit.   No results found.  Review of Systems:   A ROS was performed including pertinent positives and negatives as documented in the HPI.  Physical Exam :   Constitutional: NAD and appears stated age Neurological: Alert and oriented Psych: Appropriate affect and cooperative There were no vitals taken for this visit.   Comprehensive Musculoskeletal Exam:    Left knee with positive patellar apprehension laterally.  There is tenderness about the patellofemoral joint.  There is significant swelling in the posterior aspect of the knee.  Range of motion is from 0 to 130 degrees.  3 quadrants of lateral patellar motion with pain.  Negative Lachman, negative posterior drawer, no laxity with varus or valgus stress.   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   Assessment and Plan:   53 y.o. female with persistent left knee pain and swelling.  She is status post a meniscal debridement in May 2024.  She has been experiencing recurrent swelling following this.  She has had an injection as well as ironic acid following this.  This is not given her persistent relief.  She has been working with physical therapy.  She is continue to have pain with deeper squats.  She feels like the kneecap is subluxating and has in the past dislocated a total of 2 times.  Overall I do believe her consistent is consistent with patellofemoral pain in the setting of a lateral femoral condyle cartilage lesion.  Given the integrity of the overall remaining joint I do believe she may  ultimately be a candidate for cartilage procedure.  With this in mind I did discuss that ultimately her patella instability would need to be addressed.  I would like to obtain an updated MRI of the left knee so that we can assess what the status of the underlying cartilage is.  -Plan for MRI left knee and felt discussed with   I personally saw and evaluated the patient, and participated in the management and treatment plan.  Elspeth Parker, MD Attending Physician, Orthopedic Surgery  This document was dictated using Dragon voice recognition software. A reasonable attempt at proof reading has been made to minimize errors.

## 2023-06-16 ENCOUNTER — Ambulatory Visit (HOSPITAL_BASED_OUTPATIENT_CLINIC_OR_DEPARTMENT_OTHER): Payer: 59 | Admitting: Orthopaedic Surgery

## 2023-06-26 ENCOUNTER — Ambulatory Visit (INDEPENDENT_AMBULATORY_CARE_PROVIDER_SITE_OTHER): Payer: 59 | Admitting: Family Medicine

## 2023-06-26 VITALS — BP 139/72 | HR 82 | Ht 61.0 in | Wt 198.8 lb

## 2023-06-26 DIAGNOSIS — E538 Deficiency of other specified B group vitamins: Secondary | ICD-10-CM

## 2023-06-26 MED ORDER — CYANOCOBALAMIN 1000 MCG/ML IJ SOLN
1000.0000 ug | Freq: Once | INTRAMUSCULAR | Status: AC
Start: 2023-06-26 — End: 2023-06-26
  Administered 2023-06-26: 1000 ug via INTRAMUSCULAR

## 2023-06-26 NOTE — Progress Notes (Signed)
 HPI  Pt is here today for B12 injection. Pt denies SOB, CP, medication changes.                          Assessment and Plan:  Pt received B12 injection in LD. Tolerated well no redness or swelling noted. Pt advised to ROC in 30 days for B12 injection. (07/24/23)

## 2023-06-26 NOTE — Progress Notes (Signed)
 Agree with documentation as above.   Nani Gasser, MD

## 2023-07-05 ENCOUNTER — Ambulatory Visit
Admission: RE | Admit: 2023-07-05 | Discharge: 2023-07-05 | Disposition: A | Discharge status: Home / Self Care | Payer: 59 | Source: Ambulatory Visit | Attending: Orthopaedic Surgery | Admitting: Orthopaedic Surgery

## 2023-07-05 DIAGNOSIS — M25362 Other instability, left knee: Secondary | ICD-10-CM

## 2023-07-10 ENCOUNTER — Ambulatory Visit (HOSPITAL_BASED_OUTPATIENT_CLINIC_OR_DEPARTMENT_OTHER): Payer: Self-pay | Admitting: Orthopaedic Surgery

## 2023-07-10 ENCOUNTER — Ambulatory Visit (HOSPITAL_BASED_OUTPATIENT_CLINIC_OR_DEPARTMENT_OTHER): Payer: 59 | Admitting: Orthopaedic Surgery

## 2023-07-10 ENCOUNTER — Other Ambulatory Visit (HOSPITAL_BASED_OUTPATIENT_CLINIC_OR_DEPARTMENT_OTHER): Payer: Self-pay

## 2023-07-10 DIAGNOSIS — M25362 Other instability, left knee: Secondary | ICD-10-CM | POA: Diagnosis not present

## 2023-07-10 MED ORDER — OXYCODONE HCL 5 MG PO TABS
5.0000 mg | ORAL_TABLET | ORAL | 0 refills | Status: DC | PRN
  Filled 2023-07-10: qty 5, 1d supply, fill #0

## 2023-07-10 MED ORDER — ASPIRIN 325 MG PO TBEC
325.0000 mg | DELAYED_RELEASE_TABLET | Freq: Every day | ORAL | 0 refills | Status: DC
  Filled 2023-07-10: qty 14, 14d supply, fill #0

## 2023-07-10 MED ORDER — ACETAMINOPHEN 500 MG PO TABS
500.0000 mg | ORAL_TABLET | Freq: Three times a day (TID) | ORAL | 0 refills | Status: AC
Start: 2023-07-10 — End: 2023-07-20
  Filled 2023-07-10: qty 30, 10d supply, fill #0

## 2023-07-10 MED ORDER — IBUPROFEN 800 MG PO TABS
800.0000 mg | ORAL_TABLET | Freq: Three times a day (TID) | ORAL | 0 refills | Status: AC
  Filled 2023-07-10: qty 30, 10d supply, fill #0

## 2023-07-10 NOTE — H&P (View-Only) (Signed)
 Chief Complaint: Left knee pain     History of Present Illness:   07/10/2023: Follow-up for the left knee.  She states that she unfortunately did have a full patella dislocation event on Super Bowl Sunday and had to pop this back into place.  He is here today for MRI discussion  Megan [name] is a 53 y.o. female presents today with ongoing left knee pain since May 2024.  She has previously had a meniscal debridement.  Since that time she has had swelling and pain in the knee.  She has previously had an injection as well as hyaluronic acid following surgery.  She did not get significant relief from this.  She is here today as her injection is worn off and she is hoping to discuss further treatment options.  She has pain with squatting and deep type of flexion activities.  She has pain going up and down steps.  She has trialed physical therapy as well as multiple injections.  She has been taking anti-inflammatories with some relief.  She is here today for further discussion.    PMH/PSH/Family History/Social History/Meds/Allergies:    Past Medical History:  Diagnosis Date   Gestational diabetes mellitus in childbirth    Past Surgical History:  Procedure Laterality Date   TONSILLECTOMY AND ADENOIDECTOMY     TYMPANOSTOMY TUBE PLACEMENT  1984   Social History   Socioeconomic History   Marital status: Single    Spouse name: Tura Roller   Number of children: Not on file   Years of education: Not on file   Highest education level: Associate degree: academic program  Occupational History   Occupation: Asst Diplomatic Services operational officer    Comment: [employer]   Tobacco Use   Smoking status: Never   Smokeless tobacco: Never  Substance and Sexual Activity   Alcohol use: No   Drug use: No   Sexual activity: Yes    Partners: Male  Other Topics Concern   Not on file  Social History Narrative   2 caffeine drinks per day. No regular exercise. She works full-time and is going to school.    Social Drivers of Corporate investment banker Strain: Low Risk  (02/27/2023)   Overall Financial Resource Strain (CARDIA)    Difficulty of Paying Living Expenses: Not very hard  Food Insecurity: No Food Insecurity (02/27/2023)   Hunger Vital Sign    Worried About Running Out of Food in the Last Year: Never true    Ran Out of Food in the Last Year: Never true  Transportation Needs: No Transportation Needs (02/27/2023)   PRAPARE - Administrator, Civil Service (Medical): No    Lack of Transportation (Non-Medical): No  Physical Activity: Unknown (08/24/2022)   Exercise Vital Sign    Days of Exercise per Week: Patient declined    Minutes of Exercise per Session: Not on file  Stress: No Stress Concern Present (08/24/2022)   [employer] of Occupational Health - Occupational Stress Questionnaire    Feeling of Stress : Only a little  Social Connections: Socially Isolated (02/27/2023)   Social Connection and Isolation Panel [NHANES]    Frequency of Communication with Friends and Family: Once a week    Frequency of Social Gatherings with Friends and Family: Once a week    Attends Religious Services: Never    Database administrator or Organizations: No    Attends Engineer, structural: Not on file    Marital Status: Never  married   Family History  Problem Relation Age of Onset   Hypertension Mother    Celiac disease Mother    Hypertension Father    Hypertension Sister    Hypertension Brother    Peripheral vascular disease Maternal Grandmother    Diabetes Paternal Aunt    Diabetes Maternal Uncle    Diabetes Paternal Aunt    Colon cancer Maternal Grandfather    Crohn's disease Cousin    Allergies  Allergen Reactions   Metformin And Related Other (See Comments)    GI upset    Sertraline Nausea Only    sweats   Current Outpatient Medications  Medication Sig Dispense Refill   acetaminophen (TYLENOL) 500 MG tablet Take 1 tablet (500 mg total) by mouth  every 8 (eight) hours for 10 days. 30 tablet 0   aspirin EC 325 MG tablet Take 1 tablet (325 mg total) by mouth daily. 14 tablet 0   ibuprofen (ADVIL) 800 MG tablet Take 1 tablet (800 mg total) by mouth every 8 (eight) hours for 10 days. Please take with food, please alternate with acetaminophen 30 tablet 0   oxyCODONE (ROXICODONE) 5 MG immediate release tablet Take 1 tablet (5 mg total) by mouth every 4 (four) hours as needed for severe pain (pain score 7-10) or breakthrough pain. 5 tablet 0   acetaminophen (TYLENOL) 500 MG tablet Take 2 tablets (1,000 mg total) by mouth every 8 (eight) hours for pain. (Patient taking differently: Take 1,000 mg by mouth as needed for moderate pain (pain score 4-6) or mild pain (pain score 1-3).) 100 tablet 0   AMBULATORY NON FORMULARY MEDICATION One touch ultra 2 glucose system  Use to test once daily as directed  Please schedule f/u appt 1 each 0   amLODipine (NORVASC) 5 MG tablet TAKE 1 TABLET BY MOUTH DAILY 90 tablet 3   atorvastatin (LIPITOR) 40 MG tablet Take 1 tablet (40 mg total) by mouth daily. 90 tablet 3   losartan-hydrochlorothiazide (HYZAAR) 100-12.5 MG tablet TAKE 1 TABLET BY MOUTH DAILY 90 tablet 3   naproxen (NAPROSYN) 500 MG tablet Take 1 tablet (500 mg total) by mouth 2 (two) times daily with a meal. (Patient taking differently: Take 500 mg by mouth as needed for moderate pain (pain score 4-6).) 60 tablet 3   norgestrel-ethinyl estradiol (LO/OVRAL,CRYSELLE) 0.3-30 MG-MCG tablet Take 1 tablet by mouth daily.     phentermine 15 MG capsule Take 1 capsule (15 mg total) by mouth every morning. 30 capsule 1   Semaglutide, 2 MG/DOSE, (OZEMPIC, 2 MG/DOSE,) 8 MG/3ML SOPN Inject 2 mg (0.75 ml) into the skin once a week. 9 mL 2   No current facility-administered medications for this visit.   No results found.  Review of Systems:   A ROS was performed including pertinent positives and negatives as documented in the HPI.  Physical Exam :    Constitutional: NAD and appears stated age Neurological: Alert and oriented Psych: Appropriate affect and cooperative There were no vitals taken for this visit.   Comprehensive Musculoskeletal Exam:    Left knee with positive patellar apprehension laterally.  There is tenderness about the patellofemoral joint.  There is significant swelling in the posterior aspect of the knee.  Range of motion is from 0 to 130 degrees.  3 quadrants of lateral patellar motion with pain.  Negative Lachman, negative posterior drawer, no laxity with varus or valgus stress.   Imaging:   Xray (4 views left knee): Normal  MRI (left knee):  Full-thickness trochlear chondral lesion laterally with lateral patellar maltracking with a TT-TG distance of 10.  There does appear to be some patellar chondral loss as well   I personally reviewed and interpreted the radiographs.   Assessment and Plan:   53 y.o. female with persistent left knee pain and swelling.  She is status post a meniscal debridement in May 2024.  She has been experiencing recurrent swelling following this.  She has had an injection as well as ironic acid following this.  This is not given her persistent relief.  She has been working with physical therapy.  She is continue to have pain with deeper squats.  She feels like the kneecap is subluxating and has in the past dislocated a total of 3 times.  At this time given the fact that she is now having recurrent patellar instability with chondral loss I do ultimately believe that she would benefit from patella stabilization as the remainder of her knee is quite good-looking without any tibiofemoral osteoarthritis.  At this time we will plan for Maci biopsy and I did discuss that a second stage procedure would involve an MPFL reconstruction with MACI placement.  -Plan for left knee arthroscopy with cartilage biopsy   After a lengthy discussion of treatment options, including risks, benefits, alternatives,  complications of surgical and nonsurgical conservative options, the patient elected surgical repair.   The patient  is aware of the material risks  and complications including, but not limited to injury to adjacent structures, neurovascular injury, infection, numbness, bleeding, implant failure, thermal burns, stiffness, persistent pain, failure to heal, disease transmission from allograft, need for further surgery, dislocation, anesthetic risks, blood clots, risks of death,and others. The probabilities of surgical success and failure discussed with patient given their particular co-morbidities.The time and nature of expected rehabilitation and recovery was discussed.The patient's questions were all answered preoperatively.  No barriers to understanding were noted. I explained the natural history of the disease process and Rx rationale.  I explained to the patient what I considered to be reasonable expectations given their personal situation.  The final treatment plan was arrived at through a shared patient decision making process model.    I personally saw and evaluated the patient, and participated in the management and treatment plan.  Huel Cote, MD Attending Physician, Orthopedic Surgery  This document was dictated using Dragon voice recognition software. A reasonable attempt at proof reading has been made to minimize errors.

## 2023-07-10 NOTE — Progress Notes (Signed)
 Chief Complaint: Left knee pain     History of Present Illness:   07/10/2023: Follow-up for the left knee.  She states that she unfortunately did have a full patella dislocation event on Super Bowl Sunday and had to pop this back into place.  He is here today for MRI discussion  Megan [name] is a 53 y.o. female presents today with ongoing left knee pain since May 2024.  She has previously had a meniscal debridement.  Since that time she has had swelling and pain in the knee.  She has previously had an injection as well as hyaluronic acid following surgery.  She did not get significant relief from this.  She is here today as her injection is worn off and she is hoping to discuss further treatment options.  She has pain with squatting and deep type of flexion activities.  She has pain going up and down steps.  She has trialed physical therapy as well as multiple injections.  She has been taking anti-inflammatories with some relief.  She is here today for further discussion.    PMH/PSH/Family History/Social History/Meds/Allergies:    Past Medical History:  Diagnosis Date   Gestational diabetes mellitus in childbirth    Past Surgical History:  Procedure Laterality Date   TONSILLECTOMY AND ADENOIDECTOMY     TYMPANOSTOMY TUBE PLACEMENT  1984   Social History   Socioeconomic History   Marital status: Single    Spouse name: Tura Roller   Number of children: Not on file   Years of education: Not on file   Highest education level: Associate degree: academic program  Occupational History   Occupation: Asst Diplomatic Services operational officer    Comment: [employer]   Tobacco Use   Smoking status: Never   Smokeless tobacco: Never  Substance and Sexual Activity   Alcohol use: No   Drug use: No   Sexual activity: Yes    Partners: Male  Other Topics Concern   Not on file  Social History Narrative   2 caffeine drinks per day. No regular exercise. She works full-time and is going to school.    Social Drivers of Corporate investment banker Strain: Low Risk  (02/27/2023)   Overall Financial Resource Strain (CARDIA)    Difficulty of Paying Living Expenses: Not very hard  Food Insecurity: No Food Insecurity (02/27/2023)   Hunger Vital Sign    Worried About Running Out of Food in the Last Year: Never true    Ran Out of Food in the Last Year: Never true  Transportation Needs: No Transportation Needs (02/27/2023)   PRAPARE - Administrator, Civil Service (Medical): No    Lack of Transportation (Non-Medical): No  Physical Activity: Unknown (08/24/2022)   Exercise Vital Sign    Days of Exercise per Week: Patient declined    Minutes of Exercise per Session: Not on file  Stress: No Stress Concern Present (08/24/2022)   [employer] of Occupational Health - Occupational Stress Questionnaire    Feeling of Stress : Only a little  Social Connections: Socially Isolated (02/27/2023)   Social Connection and Isolation Panel [NHANES]    Frequency of Communication with Friends and Family: Once a week    Frequency of Social Gatherings with Friends and Family: Once a week    Attends Religious Services: Never    Database administrator or Organizations: No    Attends Engineer, structural: Not on file    Marital Status: Never  married   Family History  Problem Relation Age of Onset   Hypertension Mother    Celiac disease Mother    Hypertension Father    Hypertension Sister    Hypertension Brother    Peripheral vascular disease Maternal Grandmother    Diabetes Paternal Aunt    Diabetes Maternal Uncle    Diabetes Paternal Aunt    Colon cancer Maternal Grandfather    Crohn's disease Cousin    Allergies  Allergen Reactions   Metformin And Related Other (See Comments)    GI upset    Sertraline Nausea Only    sweats   Current Outpatient Medications  Medication Sig Dispense Refill   acetaminophen (TYLENOL) 500 MG tablet Take 1 tablet (500 mg total) by mouth  every 8 (eight) hours for 10 days. 30 tablet 0   aspirin EC 325 MG tablet Take 1 tablet (325 mg total) by mouth daily. 14 tablet 0   ibuprofen (ADVIL) 800 MG tablet Take 1 tablet (800 mg total) by mouth every 8 (eight) hours for 10 days. Please take with food, please alternate with acetaminophen 30 tablet 0   oxyCODONE (ROXICODONE) 5 MG immediate release tablet Take 1 tablet (5 mg total) by mouth every 4 (four) hours as needed for severe pain (pain score 7-10) or breakthrough pain. 5 tablet 0   acetaminophen (TYLENOL) 500 MG tablet Take 2 tablets (1,000 mg total) by mouth every 8 (eight) hours for pain. (Patient taking differently: Take 1,000 mg by mouth as needed for moderate pain (pain score 4-6) or mild pain (pain score 1-3).) 100 tablet 0   AMBULATORY NON FORMULARY MEDICATION One touch ultra 2 glucose system  Use to test once daily as directed  Please schedule f/u appt 1 each 0   amLODipine (NORVASC) 5 MG tablet TAKE 1 TABLET BY MOUTH DAILY 90 tablet 3   atorvastatin (LIPITOR) 40 MG tablet Take 1 tablet (40 mg total) by mouth daily. 90 tablet 3   losartan-hydrochlorothiazide (HYZAAR) 100-12.5 MG tablet TAKE 1 TABLET BY MOUTH DAILY 90 tablet 3   naproxen (NAPROSYN) 500 MG tablet Take 1 tablet (500 mg total) by mouth 2 (two) times daily with a meal. (Patient taking differently: Take 500 mg by mouth as needed for moderate pain (pain score 4-6).) 60 tablet 3   norgestrel-ethinyl estradiol (LO/OVRAL,CRYSELLE) 0.3-30 MG-MCG tablet Take 1 tablet by mouth daily.     phentermine 15 MG capsule Take 1 capsule (15 mg total) by mouth every morning. 30 capsule 1   Semaglutide, 2 MG/DOSE, (OZEMPIC, 2 MG/DOSE,) 8 MG/3ML SOPN Inject 2 mg (0.75 ml) into the skin once a week. 9 mL 2   No current facility-administered medications for this visit.   No results found.  Review of Systems:   A ROS was performed including pertinent positives and negatives as documented in the HPI.  Physical Exam :    Constitutional: NAD and appears stated age Neurological: Alert and oriented Psych: Appropriate affect and cooperative There were no vitals taken for this visit.   Comprehensive Musculoskeletal Exam:    Left knee with positive patellar apprehension laterally.  There is tenderness about the patellofemoral joint.  There is significant swelling in the posterior aspect of the knee.  Range of motion is from 0 to 130 degrees.  3 quadrants of lateral patellar motion with pain.  Negative Lachman, negative posterior drawer, no laxity with varus or valgus stress.   Imaging:   Xray (4 views left knee): Normal  MRI (left knee):  Full-thickness trochlear chondral lesion laterally with lateral patellar maltracking with a TT-TG distance of 10.  There does appear to be some patellar chondral loss as well   I personally reviewed and interpreted the radiographs.   Assessment and Plan:   53 y.o. female with persistent left knee pain and swelling.  She is status post a meniscal debridement in May 2024.  She has been experiencing recurrent swelling following this.  She has had an injection as well as ironic acid following this.  This is not given her persistent relief.  She has been working with physical therapy.  She is continue to have pain with deeper squats.  She feels like the kneecap is subluxating and has in the past dislocated a total of 3 times.  At this time given the fact that she is now having recurrent patellar instability with chondral loss I do ultimately believe that she would benefit from patella stabilization as the remainder of her knee is quite good-looking without any tibiofemoral osteoarthritis.  At this time we will plan for Maci biopsy and I did discuss that a second stage procedure would involve an MPFL reconstruction with MACI placement.  -Plan for left knee arthroscopy with cartilage biopsy   After a lengthy discussion of treatment options, including risks, benefits, alternatives,  complications of surgical and nonsurgical conservative options, the patient elected surgical repair.   The patient  is aware of the material risks  and complications including, but not limited to injury to adjacent structures, neurovascular injury, infection, numbness, bleeding, implant failure, thermal burns, stiffness, persistent pain, failure to heal, disease transmission from allograft, need for further surgery, dislocation, anesthetic risks, blood clots, risks of death,and others. The probabilities of surgical success and failure discussed with patient given their particular co-morbidities.The time and nature of expected rehabilitation and recovery was discussed.The patient's questions were all answered preoperatively.  No barriers to understanding were noted. I explained the natural history of the disease process and Rx rationale.  I explained to the patient what I considered to be reasonable expectations given their personal situation.  The final treatment plan was arrived at through a shared patient decision making process model.    I personally saw and evaluated the patient, and participated in the management and treatment plan.  Huel Cote, MD Attending Physician, Orthopedic Surgery  This document was dictated using Dragon voice recognition software. A reasonable attempt at proof reading has been made to minimize errors.

## 2023-07-12 ENCOUNTER — Ambulatory Visit (HOSPITAL_BASED_OUTPATIENT_CLINIC_OR_DEPARTMENT_OTHER): Payer: 59 | Admitting: Orthopaedic Surgery

## 2023-07-17 ENCOUNTER — Encounter (HOSPITAL_BASED_OUTPATIENT_CLINIC_OR_DEPARTMENT_OTHER): Payer: Self-pay | Admitting: Orthopaedic Surgery

## 2023-07-20 ENCOUNTER — Encounter (HOSPITAL_BASED_OUTPATIENT_CLINIC_OR_DEPARTMENT_OTHER)
Admission: RE | Admit: 2023-07-20 | Discharge: 2023-07-20 | Disposition: A | Discharge status: Home / Self Care | Source: Ambulatory Visit | Attending: Orthopaedic Surgery | Admitting: Orthopaedic Surgery

## 2023-07-20 DIAGNOSIS — Z01818 Encounter for other preprocedural examination: Secondary | ICD-10-CM | POA: Diagnosis present

## 2023-07-20 LAB — BASIC METABOLIC PANEL
Anion gap: 11 (ref 5–15)
BUN: 13 mg/dL (ref 6–20)
CO2: 23 mmol/L (ref 22–32)
Calcium: 9 mg/dL (ref 8.9–10.3)
Chloride: 106 mmol/L (ref 98–111)
Creatinine, Ser: 0.77 mg/dL (ref 0.44–1.00)
GFR, Estimated: >60 mL/min (ref >60)
Glucose, Bld: 91 mg/dL (ref 70–99)
Potassium: 3.9 mmol/L (ref 3.5–5.1)
Sodium: 140 mmol/L (ref 135–145)

## 2023-07-20 NOTE — Progress Notes (Signed)
 Surgical soap and pre-surgical drink (G2) given to patient, instructions given, patient verbalized understanding.

## 2023-07-22 ENCOUNTER — Encounter: Payer: Self-pay | Admitting: Family Medicine

## 2023-07-24 ENCOUNTER — Ambulatory Visit (HOSPITAL_BASED_OUTPATIENT_CLINIC_OR_DEPARTMENT_OTHER): Admitting: Anesthesiology

## 2023-07-24 ENCOUNTER — Other Ambulatory Visit: Payer: Self-pay

## 2023-07-24 ENCOUNTER — Ambulatory Visit (HOSPITAL_BASED_OUTPATIENT_CLINIC_OR_DEPARTMENT_OTHER)
Admission: RE | Admit: 2023-07-24 | Discharge: 2023-07-24 | Disposition: A | Discharge status: Home / Self Care | Attending: Orthopaedic Surgery | Admitting: Orthopaedic Surgery

## 2023-07-24 ENCOUNTER — Ambulatory Visit: Payer: 59

## 2023-07-24 ENCOUNTER — Encounter (HOSPITAL_BASED_OUTPATIENT_CLINIC_OR_DEPARTMENT_OTHER)
Admission: RE | Disposition: A | Discharge status: Home / Self Care | Payer: Self-pay | Source: Home / Self Care | Attending: Orthopaedic Surgery

## 2023-07-24 ENCOUNTER — Encounter (HOSPITAL_BASED_OUTPATIENT_CLINIC_OR_DEPARTMENT_OTHER): Payer: Self-pay | Admitting: Orthopaedic Surgery

## 2023-07-24 DIAGNOSIS — M25362 Other instability, left knee: Secondary | ICD-10-CM

## 2023-07-24 DIAGNOSIS — Z79899 Other long term (current) drug therapy: Secondary | ICD-10-CM | POA: Diagnosis not present

## 2023-07-24 DIAGNOSIS — I10 Essential (primary) hypertension: Secondary | ICD-10-CM | POA: Diagnosis not present

## 2023-07-24 DIAGNOSIS — E119 Type 2 diabetes mellitus without complications: Secondary | ICD-10-CM | POA: Diagnosis not present

## 2023-07-24 DIAGNOSIS — Z01818 Encounter for other preprocedural examination: Secondary | ICD-10-CM

## 2023-07-24 HISTORY — PX: KNEE ARTHROSCOPY WITH MACI CARTILAGE HARVEST: SHX6875

## 2023-07-24 HISTORY — DX: Essential (primary) hypertension: I10

## 2023-07-24 LAB — GLUCOSE, CAPILLARY
Glucose-Capillary: 89 mg/dL (ref 70–99)
Glucose-Capillary: 83 mg/dL (ref 70–99)

## 2023-07-24 LAB — POCT PREGNANCY, URINE: Preg Test, Ur: NEGATIVE

## 2023-07-24 SURGERY — ARTHROSCOPY, KNEE, WITH CARTILAGE PROCUREMENT FOR CULTURE
Anesthesia: General | Site: Knee | Laterality: Left

## 2023-07-24 MED ORDER — MIDAZOLAM HCL 2 MG/2ML IJ SOLN
INTRAMUSCULAR | Status: AC
  Filled 2023-07-24: qty 2

## 2023-07-24 MED ORDER — SODIUM CHLORIDE 0.9 % IR SOLN
Status: DC | PRN
  Administered 2023-07-24: 3000 mL

## 2023-07-24 MED ORDER — DEXAMETHASONE SODIUM PHOSPHATE 10 MG/ML IJ SOLN
INTRAMUSCULAR | Status: DC | PRN
  Administered 2023-07-24: 10 mg via INTRAVENOUS

## 2023-07-24 MED ORDER — SCOPOLAMINE 1 MG/3DAYS TD PT72
1.0000 | MEDICATED_PATCH | Freq: Once | TRANSDERMAL | Status: DC
  Administered 2023-07-24: 1.5 mg via TRANSDERMAL

## 2023-07-24 MED ORDER — LIDOCAINE 2% (20 MG/ML) 5 ML SYRINGE
INTRAMUSCULAR | Status: DC | PRN
  Administered 2023-07-24: 100 mg via INTRAVENOUS

## 2023-07-24 MED ORDER — FENTANYL CITRATE (PF) 100 MCG/2ML IJ SOLN
INTRAMUSCULAR | Status: AC
  Filled 2023-07-24: qty 2

## 2023-07-24 MED ORDER — OXYCODONE HCL 5 MG PO TABS
ORAL_TABLET | ORAL | Status: AC
  Filled 2023-07-24: qty 1

## 2023-07-24 MED ORDER — PROPOFOL 10 MG/ML IV BOLUS
INTRAVENOUS | Status: DC | PRN
  Administered 2023-07-24: 200 mg via INTRAVENOUS

## 2023-07-24 MED ORDER — FENTANYL CITRATE (PF) 100 MCG/2ML IJ SOLN: 25.0000 ug | INTRAMUSCULAR | Status: DC | PRN

## 2023-07-24 MED ORDER — MIDAZOLAM HCL 5 MG/5ML IJ SOLN
INTRAMUSCULAR | Status: DC | PRN
  Administered 2023-07-24: 2 mg via INTRAVENOUS

## 2023-07-24 MED ORDER — ACETAMINOPHEN 500 MG PO TABS
ORAL_TABLET | ORAL | Status: AC
  Filled 2023-07-24: qty 2

## 2023-07-24 MED ORDER — SCOPOLAMINE 1 MG/3DAYS TD PT72
MEDICATED_PATCH | TRANSDERMAL | Status: AC
  Filled 2023-07-24: qty 1

## 2023-07-24 MED ORDER — CEFAZOLIN SODIUM-DEXTROSE 2-4 GM/100ML-% IV SOLN
2.0000 g | INTRAVENOUS | Status: AC
  Administered 2023-07-24: 2 g via INTRAVENOUS

## 2023-07-24 MED ORDER — LIDOCAINE 2% (20 MG/ML) 5 ML SYRINGE
INTRAMUSCULAR | Status: AC
  Filled 2023-07-24: qty 5

## 2023-07-24 MED ORDER — OXYCODONE HCL 5 MG/5ML PO SOLN: 5.0000 mg | Freq: Once | ORAL | Status: AC | PRN

## 2023-07-24 MED ORDER — FENTANYL CITRATE (PF) 100 MCG/2ML IJ SOLN
INTRAMUSCULAR | Status: DC | PRN
  Administered 2023-07-24 (×2): 50 ug via INTRAVENOUS

## 2023-07-24 MED ORDER — ACETAMINOPHEN 500 MG PO TABS
1000.0000 mg | ORAL_TABLET | Freq: Once | ORAL | Status: AC
  Administered 2023-07-24: 1000 mg via ORAL

## 2023-07-24 MED ORDER — DEXMEDETOMIDINE HCL IN NACL 80 MCG/20ML IV SOLN
INTRAVENOUS | Status: DC | PRN
  Administered 2023-07-24: 8 ug via INTRAVENOUS

## 2023-07-24 MED ORDER — GABAPENTIN 300 MG PO CAPS
ORAL_CAPSULE | ORAL | Status: AC
  Filled 2023-07-24: qty 1

## 2023-07-24 MED ORDER — GABAPENTIN 300 MG PO CAPS
300.0000 mg | ORAL_CAPSULE | Freq: Once | ORAL | Status: AC
Start: 2023-07-24 — End: 2023-07-24
  Administered 2023-07-24: 300 mg via ORAL

## 2023-07-24 MED ORDER — ONDANSETRON HCL 4 MG/2ML IJ SOLN
INTRAMUSCULAR | Status: DC | PRN
  Administered 2023-07-24: 4 mg via INTRAVENOUS

## 2023-07-24 MED ORDER — ONDANSETRON HCL 4 MG/2ML IJ SOLN
INTRAMUSCULAR | Status: AC
  Filled 2023-07-24: qty 2

## 2023-07-24 MED ORDER — CEFAZOLIN SODIUM-DEXTROSE 2-4 GM/100ML-% IV SOLN
INTRAVENOUS | Status: AC
  Filled 2023-07-24: qty 100

## 2023-07-24 MED ORDER — OXYCODONE HCL 5 MG PO TABS
5.0000 mg | ORAL_TABLET | Freq: Once | ORAL | Status: AC | PRN
  Administered 2023-07-24: 5 mg via ORAL

## 2023-07-24 MED ORDER — TRANEXAMIC ACID-NACL 1000-0.7 MG/100ML-% IV SOLN
1000.0000 mg | INTRAVENOUS | Status: AC
  Administered 2023-07-24: 1000 mg via INTRAVENOUS

## 2023-07-24 MED ORDER — TRANEXAMIC ACID-NACL 1000-0.7 MG/100ML-% IV SOLN
INTRAVENOUS | Status: AC
  Filled 2023-07-24: qty 100

## 2023-07-24 MED ORDER — BUPIVACAINE HCL (PF) 0.25 % IJ SOLN
INTRAMUSCULAR | Status: DC | PRN
  Administered 2023-07-24: 10 mL

## 2023-07-24 MED ORDER — DEXAMETHASONE SODIUM PHOSPHATE 10 MG/ML IJ SOLN
INTRAMUSCULAR | Status: AC
  Filled 2023-07-24: qty 1

## 2023-07-24 MED ORDER — LACTATED RINGERS IV SOLN: INTRAVENOUS | Status: DC

## 2023-07-24 MED ORDER — KETOROLAC TROMETHAMINE 30 MG/ML IJ SOLN
INTRAMUSCULAR | Status: DC | PRN
  Administered 2023-07-24: 30 mg via INTRAVENOUS

## 2023-07-24 MED ORDER — DROPERIDOL 2.5 MG/ML IJ SOLN: 0.6250 mg | Freq: Once | INTRAMUSCULAR | Status: DC | PRN

## 2023-07-24 SURGICAL SUPPLY — 44 items
BANDAGE ESMARK 6X9 LF (GAUZE/BANDAGES/DRESSINGS) IMPLANT
BLADE EXCALIBUR 4.0X13 (MISCELLANEOUS) IMPLANT
BLADE SURG 15 STRL LF DISP TIS (BLADE) IMPLANT
BNDG ELASTIC 4INX 5YD STR LF (GAUZE/BANDAGES/DRESSINGS) ×1 IMPLANT
BNDG ELASTIC 6INX 5YD STR LF (GAUZE/BANDAGES/DRESSINGS) ×1 IMPLANT
BNDG ESMARK 6X9 LF (GAUZE/BANDAGES/DRESSINGS) IMPLANT
CHLORAPREP W/TINT 26 (MISCELLANEOUS) ×1 IMPLANT
CUFF TRNQT CYL 34X4.125X (TOURNIQUET CUFF) IMPLANT
DISSECTOR 3.8MM X 13CM (MISCELLANEOUS) ×1 IMPLANT
DRAPE ARTHROSCOPY W/POUCH 90 (DRAPES) ×1 IMPLANT
DRAPE IMP U-DRAPE 54X76 (DRAPES) ×1 IMPLANT
DRAPE INCISE IOBAN 66X45 STRL (DRAPES) IMPLANT
DRAPE U-SHAPE 47X51 STRL (DRAPES) ×1 IMPLANT
DW OUTFLOW CASSETTE/TUBE SET (MISCELLANEOUS) ×1 IMPLANT
ELECT REM PT RETURN 9FT ADLT (ELECTROSURGICAL) IMPLANT
ELECTRODE REM PT RTRN 9FT ADLT (ELECTROSURGICAL) IMPLANT
EXCALIBUR 3.8MM X 13CM (MISCELLANEOUS) IMPLANT
GAUZE 4X4 16PLY ~~LOC~~+RFID DBL (SPONGE) IMPLANT
GAUZE PAD ABD 8X10 STRL (GAUZE/BANDAGES/DRESSINGS) ×1 IMPLANT
GAUZE SPONGE 4X4 12PLY STRL (GAUZE/BANDAGES/DRESSINGS) ×1 IMPLANT
GAUZE XEROFORM 1X8 LF (GAUZE/BANDAGES/DRESSINGS) ×1 IMPLANT
GLOVE BIO SURGEON STRL SZ 6 (GLOVE) ×1 IMPLANT
GLOVE BIO SURGEON STRL SZ7.5 (GLOVE) ×1 IMPLANT
GLOVE BIOGEL PI IND STRL 6.5 (GLOVE) ×1 IMPLANT
GLOVE BIOGEL PI IND STRL 8 (GLOVE) ×1 IMPLANT
GOWN STRL REUS W/ TWL LRG LVL3 (GOWN DISPOSABLE) ×1 IMPLANT
GOWN STRL REUS W/ TWL XL LVL3 (GOWN DISPOSABLE) ×1 IMPLANT
MANIFOLD NEPTUNE II (INSTRUMENTS) ×1 IMPLANT
NDL HYPO 18GX1.5 BLUNT FILL (NEEDLE) ×1 IMPLANT
NDL SAFETY ECLIPSE 18X1.5 (NEEDLE) ×1 IMPLANT
NEEDLE HYPO 18GX1.5 BLUNT FILL (NEEDLE) ×1 IMPLANT
PACK ARTHROSCOPY DSU (CUSTOM PROCEDURE TRAY) ×1 IMPLANT
PACK BASIN DAY SURGERY FS (CUSTOM PROCEDURE TRAY) ×1 IMPLANT
PADDING CAST COTTON 6X4 STRL (CAST SUPPLIES) ×1 IMPLANT
PENCIL SMOKE EVACUATOR (MISCELLANEOUS) IMPLANT
SLEEVE SCD COMPRESS KNEE MED (STOCKING) ×1 IMPLANT
SUT ETHILON 3 0 PS 1 (SUTURE) ×1 IMPLANT
SUT VIC AB 2-0 SH 27XBRD (SUTURE) IMPLANT
SUT VIC AB 3-0 FS2 27 (SUTURE) IMPLANT
SYR 5ML LL (SYRINGE) ×1 IMPLANT
TOWEL GREEN STERILE FF (TOWEL DISPOSABLE) ×2 IMPLANT
TRAY DSU PREP LF (CUSTOM PROCEDURE TRAY) ×1 IMPLANT
TUBING ARTHROSCOPY IRRIG 16FT (MISCELLANEOUS) ×1 IMPLANT
WAND ABLATOR APOLLO I90 (BUR) ×1 IMPLANT

## 2023-07-24 NOTE — Op Note (Signed)
 Date of Surgery: 07/24/2023  INDICATIONS: Ms. Megan [name] is a 53 y.o.-year-old female with left knee patellofemoral instability.  The risk and benefits of the procedure were discussed in detail and documented in the pre-operative evaluation.   PREOPERATIVE DIAGNOSIS: 1.  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  PROCEDURE: 1.  Left knee arthroscopy with cartilage biopsy  SURGEON: Benancio Deeds MD  ASSISTANT: Ardeen Fillers, ATC  ANESTHESIA:  general  IV FLUIDS AND URINE: See anesthesia record.  ANTIBIOTICS: Ancef  ESTIMATED BLOOD LOSS: 5 mL.  IMPLANTS:  * No implants in log *  DRAINS: None  CULTURES: None  COMPLICATIONS: none  DESCRIPTION OF PROCEDURE:  Examination under anesthesia: A careful examination under anesthesia was performed.  Knee ROM motion was: -3 135 Lachman: Normal Pivot Shift: Normal Posterior drawer: normal.   Varus stability in full extension: normal.   Varus stability in 30 degrees of flexion: normal.  Valgus stability in full extension: normal.   Valgus stability in 30 degrees of flexion: normal.  Posterolateral drawer: normal   Intra-operative findings: A thorough arthroscopic examination of the knee was performed.  The findings are: 1. Suprapatellar pouch: Normal 2. Undersurface of median ridge: 2 x 2-1/2 cm full-thickness cartilage lesion 3. Medial patellar facet: Normal 4. Lateral patellar facet: Normal 5. Trochlea: 3 x 2 cm cartilage lesion 6. Lateral gutter/popliteus tendon: Normal 7. Hoffa's fat pad: Normal 8. Medial gutter/plica: Normal 9. ACL: Normal 10. PCL: Normal 11. Medial meniscus: Normal 12. Medial compartment cartilage: Normal 13. Lateral meniscus: Normal 14. Lateral compartment cartilage: Normal   I identified the patient in the pre-operative holding area.  I marked the operative knee with my initials. I reviewed the risks and benefits of the proposed surgical intervention  and the patient wished to proceed.  Anesthesia performed a peripheral nerve block.  Patient was subsequently taken back to the operating room.  The patient was transferred to the operative suite and placed in the supine position with all bony prominences padded.     SCDs were placed on the non-operative lower extremity. Appropriate antibiotics was administered within 1 hour before incision. The operative lower extremity was then prepped and draped in standard fashion. A time out was performed confirming the correct extremity, correct patient and correct procedure.    A standard anterolateral portal was made with an 11 blade.  The ideal position for the anteromedial portal was established using a spinal needle.  This AM portal was then created under direct visualization with an 11 blade.  A full diagnostic arthroscopy was then performed, as described above, including probing of the chondral and meniscal surfaces.     At this time the curette was introduced into the medial portal and 3 TicTac sized pieces of cartilage were harvested from the medial femoral condyle on the nonweightbearing portion.  Arthroscopic shaver was introduced and the medial plica was excised.   That concluded the case.  Skin was closed with 2-0 Vicryl and 3-0 nylon. Xeroform gauze, gauze, Tegaderm, Iceman and brace were applied.  Instrument, sponge, and needle counts were correct prior to wound closure and at the conclusion of the case.  The patient was taken to the PACU without complication   POSTOPERATIVE PLAN: She will be weightbearing as tolerated on the left leg.  We will plan for second stage procedure with MPFL reconstruction as well as cartilage transplantation.  She replaced on 2 weeks of aspirin for blood clot prevention  Benancio Deeds, MD 2:14  PM

## 2023-07-24 NOTE — Transfer of Care (Signed)
 Immediate Anesthesia Transfer of Care Note  Patient: Megan [name]  Procedure(s) Performed: ARTHROSCOPY, KNEE, WITH CARTILAGE PROCUREMENT FOR CULTURE (Left: Knee)  Patient Location: PACU  Anesthesia Type:General  Level of Consciousness: drowsy  Airway & Oxygen Therapy: Patient Spontanous Breathing and Patient connected to face mask oxygen  Post-op Assessment: Report given to RN and Post -op Vital signs reviewed and stable  Post vital signs: Reviewed and stable  Last Vitals:  Vitals Value Taken Time  BP 102/63 07/24/23 1418  Temp 36.4 C 07/24/23 1424  Pulse 100 07/24/23 1424  Resp 19 07/24/23 1424  SpO2 100 % 07/24/23 1424  Vitals shown include unfiled device data.  Last Pain:  Vitals:   07/24/23 1124  TempSrc:   PainSc: 0-No pain      Patients Stated Pain Goal: 3 (07/24/23 1117)  Complications: No notable events documented.

## 2023-07-24 NOTE — Anesthesia Procedure Notes (Signed)
 Procedure Name: LMA Insertion Date/Time: 07/24/2023 1:35 PM  Performed by: Yolanda Bonine, CRNAPre-anesthesia Checklist: Patient identified, Emergency Drugs available, Suction available, Patient being monitored and Timeout performed Patient Re-evaluated:Patient Re-evaluated prior to induction Oxygen Delivery Method: Circle system utilized Preoxygenation: Pre-oxygenation with 100% oxygen Induction Type: IV induction Ventilation: Mask ventilation without difficulty LMA: LMA inserted LMA Size: 4.0 Number of attempts: 1 Placement Confirmation: positive ETCO2 Tube secured with: Tape Dental Injury: Teeth and Oropharynx as per pre-operative assessment

## 2023-07-24 NOTE — Interval H&P Note (Signed)
 History and Physical Interval Note:  07/24/2023 1:25 PM  Megan [name]  has presented today for surgery, with the diagnosis of LEFT KNEE CARTILAGE LESION.  The various methods of treatment have been discussed with the patient and family. After consideration of risks, benefits and other options for treatment, the patient has consented to  Procedure(s) with comments: ARTHROSCOPY, KNEE, WITH CARTILAGE PROCUREMENT FOR CULTURE (Left) - LEFT KNEE ARTHROSCOPY WITH MACI HARVEST as a surgical intervention.  The patient's history has been reviewed, patient examined, no change in status, stable for surgery.  I have reviewed the patient's chart and labs.  Questions were answered to the patient's satisfaction.     Huel Cote

## 2023-07-24 NOTE — Discharge Instr - Supplementary Instructions (Signed)
 Tylenol given at 11:25 am. May take Ibuprofen,Advil, Aleve after 8pm if needed for discomfort.

## 2023-07-24 NOTE — Discharge Instructions (Addendum)
 Discharge Instructions    Attending Surgeon: Huel Cote, MD Office Phone Number: [phone]   Diagnosis and Procedures:    Surgeries Performed: Left knee chondral biopsy  Discharge Plan:    Diet: Resume usual diet. Begin with light or bland foods.  Drink plenty of fluids.  Activity:  As tolerated left leg. You are advised to go home directly from the hospital or surgical center. Restrict your activities.  GENERAL INSTRUCTIONS: 1.  Please apply ice to your wound to help with swelling and inflammation. This will improve your comfort and your overall recovery following surgery.     2. Please call Dr. Serena Croissant office at [phone] with questions Monday-Friday during business hours. If no one answers, please leave a message and someone should get back to the patient within 24 hours. For emergencies please call 911 or proceed to the emergency room.   3. Patient to notify surgical team if experiences any of the following: Bowel/Bladder dysfunction, uncontrolled pain, nerve/muscle weakness, incision with increased drainage or redness, nausea/vomiting and Fever greater than 101.0 F.  Be alert for signs of infection including redness, streaking, odor, fever or chills. Be alert for excessive pain or bleeding and notify your surgeon immediately.  WOUND INSTRUCTIONS:   Leave your dressing, cast, or splint in place until your post operative visit.  Keep it clean and dry.  Always keep the incision clean and dry until the staples/sutures are removed. If there is no drainage from the incision you should keep it open to air. If there is drainage from the incision you must keep it covered at all times until the drainage stops  Do not soak in a bath tub, hot tub, pool, lake or other body of water until 21 days after your surgery and your incision is completely dry and healed.  If you have removable sutures (or staples) they must be removed 10-14 days (unless otherwise instructed) from  the day of your surgery.     1)  Elevate the extremity as much as possible.  2)  Keep the dressing clean and dry.  3)  Please call us if the dressing becomes wet or dirty.  4)  If you are experiencing worsening pain or worsening swelling, please call.     MEDICATIONS: Resume all previous home medications at the previous prescribed dose and frequency unless otherwise noted Start taking the  pain medications on an as-needed basis as prescribed  Please taper down pain medication over the next week following surgery.  Ideally you should not require a refill of any narcotic pain medication.  Take pain medication with food to minimize nausea. In addition to the prescribed pain medication, you may take over-the-counter pain relievers such as Tylenol.  Do NOT take additional tylenol if your pain medication already has tylenol in it.  Aspirin 325mg  daily per instructions on bottle. Narcotic policy: Per [facility] clinic policy, our goal is ensure optimal postoperative pain control with a multimodal pain management strategy. For all [facility] patients, our goal is to wean post-operative narcotic medications by 6 weeks post-operatively, and many times sooner. If this is not possible due to utilization of pain medication prior to surgery, your [facility] doctor will support your acute post-operative pain control for the first 6 weeks postoperatively, with a plan to transition you back to your primary pain team following that. Cyndia Skeeters will work to ensure a Therapist, occupational.       FOLLOWUP INSTRUCTIONS: 1. Follow up at the Physical Therapy Clinic 3-4  days following surgery. This appointment should be scheduled unless other arrangements have been made.The Physical Therapy scheduling number is [phone] if an appointment has not already been arranged.  2. Contact Dr. Serena Croissant office during office hours at [phone] or the practice after hours line at [phone] for non-emergencies. For medical  emergencies call 911.   Discharge Location: Home  Post Anesthesia Home Care Instructions  Activity: Get plenty of rest for the remainder of the day. A responsible adult should stay with you for 24 hours following the procedure.  For the next 24 hours, DO NOT: -Drive a car -Advertising copywriter -Drink alcoholic beverages -Take any medication unless instructed by your physician -Make any legal decisions or sign important papers.  Meals: Start with liquid foods such as gelatin or soup. Progress to regular foods as tolerated. Avoid greasy, spicy, heavy foods. If nausea and/or vomiting occur, drink only clear liquids until the nausea and/or vomiting subsides. Call your physician if vomiting continues.  Special Instructions/Symptoms: Your throat may feel dry or sore from the anesthesia or the breathing tube placed in your throat during surgery. If this causes discomfort, gargle with warm salt water. The discomfort should disappear within 24 hours.  If you had a scopolamine patch placed behind your ear for the management of post- operative nausea and/or vomiting:  1. The medication in the patch is effective for 72 hours, after which it should be removed.  Wrap patch in a tissue and discard in the trash. Wash hands thoroughly with soap and water. 2. You may remove the patch earlier than 72 hours if you experience unpleasant side effects which may include dry mouth, dizziness or visual disturbances. 3. Avoid touching the patch. Wash your hands with soap and water after contact with the patch.

## 2023-07-24 NOTE — Brief Op Note (Signed)
   Brief Op Note  Date of Surgery: 07/24/2023  Preoperative Diagnosis: LEFT KNEE CARTILAGE LESION  Postoperative Diagnosis: same  Procedure: Procedure(s): ARTHROSCOPY, KNEE, WITH CARTILAGE PROCUREMENT FOR CULTURE  Implants: * No implants in log *  Surgeons: Surgeon(s): Huel Cote, MD  Anesthesia: General    Estimated Blood Loss: See anesthesia record  Complications: None  Condition to PACU: Stable  Benancio Deeds, MD 07/24/2023 2:13 PM

## 2023-07-24 NOTE — Anesthesia Preprocedure Evaluation (Addendum)
 Anesthesia Evaluation  Patient identified by MRN, date of birth, ID band Patient awake    Reviewed: Allergy & Precautions, NPO status , Patient's Chart, lab work & pertinent test results  History of Anesthesia Complications Negative for: history of anesthetic complications  Airway Mallampati: II  TM Distance: >3 FB Neck ROM: Full    Dental  (+) Missing,    Pulmonary neg pulmonary ROS   Pulmonary exam normal        Cardiovascular hypertension, Pt. on medications Normal cardiovascular exam     Neuro/Psych   Anxiety        GI/Hepatic negative GI ROS, Neg liver ROS,,,  Endo/Other  diabetes, Type 2    Renal/GU negative Renal ROS     Musculoskeletal LEFT KNEE CARTILAGE LESION   Abdominal   Peds  Hematology negative hematology ROS (+)   Anesthesia Other Findings Day of surgery medications reviewed with patient.  Reproductive/Obstetrics                             Anesthesia Physical Anesthesia Plan  ASA: 2  Anesthesia Plan: General   Post-op Pain Management: Tylenol PO (pre-op)*   Induction: Intravenous  PONV Risk Score and Plan: 3 and Ondansetron, Dexamethasone, Treatment may vary due to age or medical condition, Midazolam and Scopolamine patch - Pre-op  Airway Management Planned: LMA  Additional Equipment: None  Intra-op Plan:   Post-operative Plan: Extubation in OR  Informed Consent: I have reviewed the patients History and Physical, chart, labs and discussed the procedure including the risks, benefits and alternatives for the proposed anesthesia with the patient or authorized representative who has indicated his/her understanding and acceptance.     Dental advisory given  Plan Discussed with: CRNA  Anesthesia Plan Comments:        Anesthesia Quick Evaluation

## 2023-07-24 NOTE — Anesthesia Postprocedure Evaluation (Signed)
 Anesthesia Post Note  Patient: Megan [name]  Procedure(s) Performed: ARTHROSCOPY, KNEE, WITH CARTILAGE PROCUREMENT FOR CULTURE (Left: Knee)     Patient location during evaluation: PACU Anesthesia Type: General Level of consciousness: awake and alert Pain management: pain level controlled Vital Signs Assessment: post-procedure vital signs reviewed and stable Respiratory status: spontaneous breathing, nonlabored ventilation and respiratory function stable Cardiovascular status: blood pressure returned to baseline Postop Assessment: no apparent nausea or vomiting Anesthetic complications: no   No notable events documented.  Last Vitals:  Vitals:   07/24/23 1424 07/24/23 1435  BP:    Pulse: 87 84  Resp: 19 (!) 21  Temp: 36.4 C   SpO2: 100% 100%    Last Pain:  Vitals:   07/24/23 1124  TempSrc:   PainSc: 0-No pain                 Shanda Howells

## 2023-07-25 ENCOUNTER — Encounter (HOSPITAL_BASED_OUTPATIENT_CLINIC_OR_DEPARTMENT_OTHER): Payer: Self-pay | Admitting: Orthopaedic Surgery

## 2023-07-25 NOTE — Telephone Encounter (Signed)
 Okay to use acute slot later this week

## 2023-07-27 ENCOUNTER — Encounter: Payer: Self-pay | Admitting: Family Medicine

## 2023-07-27 ENCOUNTER — Ambulatory Visit: Admitting: Family Medicine

## 2023-07-27 VITALS — BP 134/71 | HR 97 | Ht 64.0 in | Wt 196.0 lb

## 2023-07-27 DIAGNOSIS — R790 Abnormal level of blood mineral: Secondary | ICD-10-CM

## 2023-07-27 DIAGNOSIS — E1129 Type 2 diabetes mellitus with other diabetic kidney complication: Secondary | ICD-10-CM | POA: Diagnosis not present

## 2023-07-27 DIAGNOSIS — E538 Deficiency of other specified B group vitamins: Secondary | ICD-10-CM

## 2023-07-27 DIAGNOSIS — E119 Type 2 diabetes mellitus without complications: Secondary | ICD-10-CM

## 2023-07-27 DIAGNOSIS — R202 Paresthesia of skin: Secondary | ICD-10-CM

## 2023-07-27 DIAGNOSIS — R809 Proteinuria, unspecified: Secondary | ICD-10-CM

## 2023-07-27 NOTE — Progress Notes (Signed)
 Acute Office Visit  Subjective:     Patient ID: Megan [name], female    DOB: 04-09-1971, 53 y.o.   MRN: [id number]  Chief Complaint  Patient presents with   foot pain    Numbness,tingling,shooting pain. She reports that this started last week and that it was worse in her R foot than her L. The worse days were W/TH/F and they lasted all day. She said that Friday she wore flat shoes (tennis shoes/with socks) and she felt like there was a burning sensation into her feet that made her want to "rip" her socks off. She said that she hasn't done anything different. Her sxs have since gotten better.  She also stated that this is not like her Raynauds     HPI Patient is in today for   Numbness,tingling,shooting pain. She reports that this started last week and that it was worse in her R foot than her L. The worse days were W/TH/F and they lasted all day. She said that Friday she wore flat shoes (tennis shoes/with socks) and she felt like there was a burning sensation into her feet that made her want to "rip" her socks off. She said that she hasn't done anything different. Her sxs have since gotten bette   She denies any known injury or trauma to the area she does not remember sitting awkwardly she has not had any recent low back pain or spinal issues.  She does feel like it is gradually gotten better.  This did happen before her surgery that she just had on her left knee which she seems to be recovering well from.  They took samples of her cartilage and they are going to try to grow it in the lab and then reinserted to try to help improve cartilage restoration.  Does have a history of diabetes but well-controlled her last A1c was 5.5.  ROS      Objective:    BP 134/71   Pulse 97   Ht 5\' 4"  (1.626 m)   Wt 196 lb (88.9 kg)   LMP  (LMP Unknown)   SpO2 97%   BMI 33.64 kg/m    Physical Exam Vitals reviewed.  Constitutional:      Appearance: Normal appearance.  HENT:     Head:  Normocephalic.  Pulmonary:     Effort: Pulmonary effort is normal.  Skin:    Comments: Foot with no swelling and no discoloration.  Nontender calf squeeze.  Dorsal pedal pulse and posterior tibial pulse 2+ bilaterally on the right.  Neurological:     Mental Status: She is alert and oriented to person, place, and time.  Psychiatric:        Mood and Affect: Mood normal.        Behavior: Behavior normal.     No results found for any visits on 07/27/23.      Assessment & Plan:   Problem List Items Addressed This Visit       Endocrine   Microalbuminuria due to type 2 diabetes mellitus (HCC)   Relevant Orders   B12   CMP14+EGFR   Hemoglobin A1c   TSH   Vitamin B1   Vitamin B6   Copper, serum   Urine Microalbumin w/creat. ratio   Diabetes type 2, controlled (HCC)   Relevant Orders   B12   CMP14+EGFR   Hemoglobin A1c   TSH   Vitamin B1   Vitamin B6   Copper, serum   Urine Microalbumin w/creat.  ratio     Other   Vitamin B12 deficiency   Relevant Orders   B12   CMP14+EGFR   Hemoglobin A1c   TSH   Vitamin B1   Vitamin B6   Copper, serum   Urine Microalbumin w/creat. ratio   High serum copper level - Primary   Gene testing negative for Wilson's disease.  Right now just trying to minimize copper exposure.      Relevant Orders   B12   CMP14+EGFR   Hemoglobin A1c   TSH   Vitamin B1   Vitamin B6   Copper, serum   Urine Microalbumin w/creat. ratio   Other Visit Diagnoses       Paresthesia of both feet       Relevant Orders   B12   CMP14+EGFR   Hemoglobin A1c   TSH   Vitamin B1   Vitamin B6   Copper, serum   Urine Microalbumin w/creat. ratio      It affected both feet and came on very abruptly but seems to be improving on its own.  No sign of swelling or discoloration to indicate DVT.  Will check labs to rule out deficiencies etc.  She is due for her next B12 shot.  Will get updated A1c that her last 1 looked absolutely phenomenal and this is not the  typical progression we would see with diabetic neuropathy.  Good blood flow to that foot.  Will call with results once available.  If everything comes back normal then we will plan to just monitor over the next week or 2 and see if it gradually improves on its own.  No orders of the defined types were placed in this encounter.   No follow-ups on file.  Nani Gasser, MD

## 2023-07-27 NOTE — Assessment & Plan Note (Signed)
 Gene testing negative for Wilson's disease.  Right now just trying to minimize copper exposure.

## 2023-07-28 ENCOUNTER — Encounter: Payer: Self-pay | Admitting: Family Medicine

## 2023-07-28 NOTE — Progress Notes (Signed)
 Hi Megan [name], A1c is 5.7 in the prediabetes range.  B12 looks good.  Metabolic panel is normal.  Thyroid looks great.  Vitamin B1 and B6 are still pending as well as copper levels.  No excess protein in the urine.

## 2023-07-31 ENCOUNTER — Ambulatory Visit (INDEPENDENT_AMBULATORY_CARE_PROVIDER_SITE_OTHER)

## 2023-07-31 VITALS — BP 140/65 | HR 91 | Ht 64.0 in

## 2023-07-31 DIAGNOSIS — E538 Deficiency of other specified B group vitamins: Secondary | ICD-10-CM | POA: Diagnosis not present

## 2023-07-31 MED ORDER — CYANOCOBALAMIN 1000 MCG/ML IJ SOLN
1000.0000 ug | Freq: Once | INTRAMUSCULAR | Status: AC
  Administered 2023-07-31: 1000 ug via INTRAMUSCULAR

## 2023-07-31 NOTE — Patient Instructions (Signed)
Return in 30 days for next B12 injection as nurse visit.

## 2023-07-31 NOTE — Progress Notes (Signed)
   Established Patient Office Visit  Subjective   Patient ID: Megan MCARTHY, female    DOB: 1971-03-12  Age: 53 y.o. MRN: [id number]  Chief Complaint  Patient presents with   b12 deficiency    B12 injection nurse visit.     HPI  B12 deficiency- B12 injection nurse visit. Patient denies weakness, irregular heart rate , GI problems or problems with past B12 inejctions. Patient states she did have Left knee surgery x 1 week ago- doing well today after procedure.  ROS    Objective:     BP (!) 140/65   Pulse 91   Ht 5\' 4"  (1.626 m)   LMP  (LMP Unknown)   SpO2 100%   BMI 33.64 kg/m    Physical Exam   No results found for any visits on 07/31/23.    The 10-year ASCVD risk score (Arnett DK, et al., 2019) is: 3.3%    Assessment & Plan:  B12 injection - admin cyanocobalamin 1,067mcg IM Right deltoid. Patient tolerated injection well without complications.  Return in 30 days for next B12 injection, Problem List Items Addressed This Visit       Other   Vitamin B12 deficiency - Primary    Return in about 30 days (around 08/30/2023) for B12 injection nurse visit. Elizabeth Palau, LPN

## 2023-08-01 LAB — CMP14+EGFR
ALT: 32 IU/L (ref 0–32)
AST: 21 IU/L (ref 0–40)
Albumin: 4.1 g/dL (ref 3.9–4.9)
Alkaline Phosphatase: 91 IU/L (ref 44–121)
BUN/Creatinine Ratio: 13 (ref 9–23)
BUN: 12 mg/dL (ref 6–24)
Bilirubin Total: 0.6 mg/dL (ref 0.0–1.2)
CO2: 23 mmol/L (ref 20–29)
Calcium: 9.3 mg/dL (ref 8.7–10.2)
Chloride: 102 mmol/L (ref 96–106)
Creatinine, Ser: 0.89 mg/dL (ref 0.57–1.00)
Globulin, Total: 2.1 g/dL (ref 1.5–4.5)
Glucose: 87 mg/dL (ref 70–99)
Potassium: 4 mmol/L (ref 3.5–5.2)
Sodium: 141 mmol/L (ref 134–144)
Total Protein: 6.2 g/dL (ref 6.0–8.5)
eGFR: 79 mL/min/{1.73_m2} (ref >59)

## 2023-08-01 LAB — MICROALBUMIN / CREATININE URINE RATIO
Creatinine, Urine: 60.7 mg/dL
Microalb/Creat Ratio: <5 mg/g{creat} (ref 0–29)
Microalbumin, Urine: <3 ug/mL

## 2023-08-01 LAB — VITAMIN B6: Vitamin B6: 9.3 ug/L (ref 3.4–65.2)

## 2023-08-01 LAB — TSH: TSH: 1.81 u[IU]/mL (ref 0.450–4.500)

## 2023-08-01 LAB — HEMOGLOBIN A1C
Est. average glucose Bld gHb Est-mCnc: 117 mg/dL
Hgb A1c MFr Bld: 5.7 % — ABNORMAL HIGH (ref 4.8–5.6)

## 2023-08-01 LAB — VITAMIN B1: Thiamine: 130.9 nmol/L (ref 66.5–200.0)

## 2023-08-01 LAB — VITAMIN B12: Vitamin B-12: 522 pg/mL (ref 232–1245)

## 2023-08-01 LAB — COPPER, SERUM: Copper: 212 ug/dL — ABNORMAL HIGH (ref 80–158)

## 2023-08-02 ENCOUNTER — Other Ambulatory Visit: Payer: Self-pay | Admitting: Family Medicine

## 2023-08-02 NOTE — Progress Notes (Signed)
 Vitamins B1 and B6 are normal.  Copper level is still elevated but similar to what it has been over the last year.

## 2023-08-07 ENCOUNTER — Ambulatory Visit (HOSPITAL_BASED_OUTPATIENT_CLINIC_OR_DEPARTMENT_OTHER): Payer: Self-pay | Admitting: Orthopaedic Surgery

## 2023-08-07 ENCOUNTER — Ambulatory Visit (INDEPENDENT_AMBULATORY_CARE_PROVIDER_SITE_OTHER): Admitting: Orthopaedic Surgery

## 2023-08-07 ENCOUNTER — Other Ambulatory Visit (HOSPITAL_BASED_OUTPATIENT_CLINIC_OR_DEPARTMENT_OTHER): Payer: Self-pay

## 2023-08-07 DIAGNOSIS — M25362 Other instability, left knee: Secondary | ICD-10-CM

## 2023-08-07 MED ORDER — ASPIRIN 325 MG PO TBEC
325.0000 mg | DELAYED_RELEASE_TABLET | Freq: Every day | ORAL | 0 refills | Status: DC
  Filled 2023-08-07: qty 14, 14d supply, fill #0

## 2023-08-07 MED ORDER — OXYCODONE HCL 5 MG PO TABS
5.0000 mg | ORAL_TABLET | ORAL | 0 refills | Status: DC | PRN
  Filled 2023-08-07: qty 15, 3d supply, fill #0

## 2023-08-07 MED ORDER — IBUPROFEN 800 MG PO TABS
800.0000 mg | ORAL_TABLET | Freq: Three times a day (TID) | ORAL | 0 refills | Status: AC
Start: 2023-08-07 — End: 2023-08-17
  Filled 2023-08-07: qty 30, 10d supply, fill #0

## 2023-08-07 MED ORDER — ACETAMINOPHEN 500 MG PO TABS
500.0000 mg | ORAL_TABLET | Freq: Three times a day (TID) | ORAL | 0 refills | Status: AC
  Filled 2023-08-07: qty 30, 10d supply, fill #0

## 2023-08-07 NOTE — Progress Notes (Signed)
 Post Operative Evaluation    Procedure/Date of Surgery: Status post left knee cartilage biopsy 3/17  Interval History:   Presents today for first postop status post left knee cartilage biopsy.  Diagnostic arthroscopy did confirm a 3 x 2 cm lesion over the central trochlea as well as a 2 x 2-1/2 lesion over the median undersurface ridge of the patella.  She is overall doing well after the surgery.  Minimal swelling.  Range of motion is normalized   PMH/PSH/Family History/Social History/Meds/Allergies:    Past Medical History:  Diagnosis Date   Gestational diabetes mellitus in childbirth    Hypertension    Past Surgical History:  Procedure Laterality Date   KNEE ARTHROSCOPY WITH MACI CARTILAGE HARVEST Left 07/24/2023   Procedure: ARTHROSCOPY, KNEE, WITH CARTILAGE PROCUREMENT FOR CULTURE;  Surgeon: Huel Cote, MD;  Location: [facility];  Service: Orthopedics;  Laterality: Left;  LEFT KNEE ARTHROSCOPY WITH MACI HARVEST   TONSILLECTOMY AND ADENOIDECTOMY     TYMPANOSTOMY TUBE PLACEMENT  1984   Social History   Socioeconomic History   Marital status: Single    Spouse name: Marit Goodwill   Number of children: Not on file   Years of education: Not on file   Highest education level: Associate degree: academic program  Occupational History   Occupation: Asst Diplomatic Services operational officer    Comment: [employer]   Tobacco Use   Smoking status: Never   Smokeless tobacco: Never  Vaping Use   Vaping status: Never Used  Substance and Sexual Activity   Alcohol use: No   Drug use: No   Sexual activity: Yes    Partners: Male  Other Topics Concern   Not on file  Social History Narrative   2 caffeine drinks per day. No regular exercise. She works full-time and is going to school.   Social Drivers of Corporate investment banker Strain: Low Risk  (02/27/2023)   Overall Financial Resource Strain (CARDIA)    Difficulty of Paying Living  Expenses: Not very hard  Food Insecurity: No Food Insecurity (02/27/2023)   Hunger Vital Sign    Worried About Running Out of Food in the Last Year: Never true    Ran Out of Food in the Last Year: Never true  Transportation Needs: No Transportation Needs (02/27/2023)   PRAPARE - Administrator, Civil Service (Medical): No    Lack of Transportation (Non-Medical): No  Physical Activity: Unknown (08/24/2022)   Exercise Vital Sign    Days of Exercise per Week: Patient declined    Minutes of Exercise per Session: Not on file  Stress: No Stress Concern Present (08/24/2022)   [employer] of Occupational Health - Occupational Stress Questionnaire    Feeling of Stress : Only a little  Social Connections: Socially Isolated (02/27/2023)   Social Connection and Isolation Panel [NHANES]    Frequency of Communication with Friends and Family: Once a week    Frequency of Social Gatherings with Friends and Family: Once a week    Attends Religious Services: Never    Database administrator or Organizations: No    Attends Engineer, structural: Not on file    Marital Status: Never married   Family History  Problem Relation Age of Onset   Hypertension Mother    Celiac disease  Mother    Hypertension Father    Hypertension Sister    Hypertension Brother    Peripheral vascular disease Maternal Grandmother    Diabetes Paternal Aunt    Diabetes Maternal Uncle    Diabetes Paternal Aunt    Colon cancer Maternal Grandfather    Crohn's disease Cousin    Allergies  Allergen Reactions   Metformin And Related Other (See Comments)    GI upset    Sertraline Nausea Only    sweats   Current Outpatient Medications  Medication Sig Dispense Refill   acetaminophen (TYLENOL) 500 MG tablet Take 1 tablet (500 mg total) by mouth every 8 (eight) hours for 10 days. 30 tablet 0   aspirin EC 325 MG tablet Take 1 tablet (325 mg total) by mouth daily. 14 tablet 0   ibuprofen (ADVIL) 800 MG  tablet Take 1 tablet (800 mg total) by mouth every 8 (eight) hours for 10 days. Please take with food, please alternate with acetaminophen 30 tablet 0   oxyCODONE (ROXICODONE) 5 MG immediate release tablet Take 1 tablet (5 mg total) by mouth every 4 (four) hours as needed for severe pain (pain score 7-10) or breakthrough pain. 15 tablet 0   AMBULATORY NON FORMULARY MEDICATION One touch ultra 2 glucose system  Use to test once daily as directed  Please schedule f/u appt 1 each 0   amLODipine (NORVASC) 5 MG tablet TAKE 1 TABLET BY MOUTH DAILY 90 tablet 3   atorvastatin (LIPITOR) 40 MG tablet Take 1 tablet (40 mg total) by mouth daily. 90 tablet 3   losartan-hydrochlorothiazide (HYZAAR) 100-12.5 MG tablet TAKE 1 TABLET BY MOUTH DAILY 90 tablet 3   norgestrel-ethinyl estradiol (LO/OVRAL,CRYSELLE) 0.3-30 MG-MCG tablet Take 1 tablet by mouth daily.     Semaglutide, 2 MG/DOSE, (OZEMPIC, 2 MG/DOSE,) 8 MG/3ML SOPN Inject 2 mg (0.75 ml) into the skin once a week. 9 mL 2   No current facility-administered medications for this visit.   No results found.  Review of Systems:   A ROS was performed including pertinent positives and negatives as documented in the HPI.   Musculoskeletal Exam:    There were no vitals taken for this visit.  Left knee incisions are well-appearing without erythema or drainage.  Persistent lateral apprehension with 4 quadrants of lateral patellar motion.  This is particularly in hyperextension.  Positive J sign.  Range of motion is from 0 to 130 degrees.  No joint line tenderness negative McMurray, negative Lachman, negative posterior drawer  Imaging:      I personally reviewed and interpreted the radiographs.   Assessment:   2 weeks status post the above biopsy.  She does have confirmed patella and trochlear lesions measuring 2-1/2 x 2 cm and 3 x 2 cm respectively.  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  I did discuss the risks and benefit as well as limitations.  She has had multiple episodes of instability despite physical therapy.  Given this I did also discuss the recovery timeframe.  She has elected for this after discussion  Plan :    -Plan for left knee matrix associated chondrocyte implantation patella and and trochlea with MPFL reconstruction   After a lengthy discussion of treatment options, including risks, benefits, alternatives, complications of surgical and nonsurgical conservative options, the  patient elected surgical repair.   The patient  is aware of the material risks  and complications including, but not limited to injury to adjacent structures, neurovascular injury, infection, numbness, bleeding, implant failure, thermal burns, stiffness, persistent pain, failure to heal, disease transmission from allograft, need for further surgery, dislocation, anesthetic risks, blood clots, risks of death,and others. The probabilities of surgical success and failure discussed with patient given their particular co-morbidities.The time and nature of expected rehabilitation and recovery was discussed.The patient's questions were all answered preoperatively.  No barriers to understanding were noted. I explained the natural history of the disease process and Rx rationale.  I explained to the patient what I considered to be reasonable expectations given their personal situation.  The final treatment plan was arrived at through a shared patient decision making process model.       I personally saw and evaluated the patient, and participated in the management and treatment plan.  Huel Cote, MD Attending Physician, Orthopedic Surgery  This document was dictated using Dragon voice recognition software. A reasonable attempt at proof reading has been made to minimize errors.

## 2023-08-15 ENCOUNTER — Other Ambulatory Visit: Payer: Self-pay | Admitting: Family Medicine

## 2023-08-15 DIAGNOSIS — E119 Type 2 diabetes mellitus without complications: Secondary | ICD-10-CM

## 2023-08-16 ENCOUNTER — Other Ambulatory Visit (HOSPITAL_BASED_OUTPATIENT_CLINIC_OR_DEPARTMENT_OTHER): Payer: Self-pay

## 2023-08-16 MED ORDER — OZEMPIC (2 MG/DOSE) 8 MG/3ML ~~LOC~~ SOPN
2.0000 mg | PEN_INJECTOR | SUBCUTANEOUS | 2 refills | Status: DC
  Filled 2023-08-16: qty 9, 84d supply, fill #0
  Filled 2023-11-15: qty 9, 84d supply, fill #1
  Filled 2024-02-05: qty 9, 84d supply, fill #2

## 2023-08-17 ENCOUNTER — Other Ambulatory Visit (HOSPITAL_BASED_OUTPATIENT_CLINIC_OR_DEPARTMENT_OTHER): Payer: Self-pay

## 2023-08-30 ENCOUNTER — Encounter: Payer: Self-pay | Admitting: Family Medicine

## 2023-08-30 ENCOUNTER — Ambulatory Visit: Payer: 59 | Admitting: Family Medicine

## 2023-08-30 VITALS — BP 132/78 | HR 99 | Ht 64.0 in | Wt 193.0 lb

## 2023-08-30 DIAGNOSIS — I10 Essential (primary) hypertension: Secondary | ICD-10-CM

## 2023-08-30 DIAGNOSIS — E119 Type 2 diabetes mellitus without complications: Secondary | ICD-10-CM | POA: Diagnosis not present

## 2023-08-30 DIAGNOSIS — E1129 Type 2 diabetes mellitus with other diabetic kidney complication: Secondary | ICD-10-CM | POA: Diagnosis not present

## 2023-08-30 DIAGNOSIS — H9202 Otalgia, left ear: Secondary | ICD-10-CM

## 2023-08-30 DIAGNOSIS — R809 Proteinuria, unspecified: Secondary | ICD-10-CM

## 2023-08-30 DIAGNOSIS — E538 Deficiency of other specified B group vitamins: Secondary | ICD-10-CM | POA: Diagnosis not present

## 2023-08-30 DIAGNOSIS — Z7985 Long-term (current) use of injectable non-insulin antidiabetic drugs: Secondary | ICD-10-CM

## 2023-08-30 MED ORDER — CYANOCOBALAMIN 1000 MCG/ML IJ SOLN
1000.0000 ug | Freq: Once | INTRAMUSCULAR | Status: AC
  Administered 2023-08-30: 1000 ug via INTRAMUSCULAR

## 2023-08-30 NOTE — Assessment & Plan Note (Signed)
 Due for b12 injection today

## 2023-08-30 NOTE — Progress Notes (Signed)
 Pt came in today for B12 injection.  Injection given in LD pt tolerated well.   Pt reports no negative side effects from medication. Denies any dizziness, chest pain or palpitations, and no GI problems.  She will RTC in 1 month for next injection

## 2023-08-30 NOTE — Assessment & Plan Note (Signed)
 Well controlled. Continue current regimen. Follow up in  6 mo

## 2023-08-30 NOTE — Patient Instructions (Signed)
 Please schedule your mammo this summer.

## 2023-08-30 NOTE — Assessment & Plan Note (Signed)
 Lab Results  Component Value Date   HGBA1C 5.7 (H) 07/27/2023   Looks good. Keep up the good work.

## 2023-08-30 NOTE — Progress Notes (Signed)
 Established Patient Office Visit  Subjective  Patient ID: Megan [name], female    DOB: 04/15/1971  Age: 53 y.o. MRN: [id number]  Chief Complaint  Patient presents with   Hypertension   B12 Injection    HPI  Hypertension- Pt denies chest pain, SOB, dizziness, or heart palpitations.  Taking meds as directed w/o problems.  Denies medication side effects.    B12 - will give injection today.    She also complains about her left ear today.  She says it has been bothering her on and off for years she is pretty sure she ruptured that eardrum when her son was about 30.  But in the last 3 months it has been more painful and bothersome when she flew on a plane recently it was more severe sharp stabbing pain she has not had any drainage from the ear but it feels like there is water in it.  Knee surgery coming up.  Will need to hold GLP for one week before surgery. Form completed and faxed back to Megan [name] earlier this month.      ROS    Objective:     BP 132/78   Pulse 99   Ht 5\' 4"  (1.626 m)   Wt 193 lb (87.5 kg)   SpO2 100%   BMI 33.13 kg/m    Physical Exam Vitals and nursing note reviewed.  Constitutional:      Appearance: Normal appearance.  HENT:     Head: Normocephalic and atraumatic.     Right Ear: Tympanic membrane, ear canal and external ear normal.     Left Ear: Ear canal and external ear normal.     Ears:     Comments: Left tympanic membrane with small perforation and wrinkling of the TM.  Ossicle appears normal.  No active drainage. Eyes:     Conjunctiva/sclera: Conjunctivae normal.  Cardiovascular:     Rate and Rhythm: Normal rate and regular rhythm.  Pulmonary:     Effort: Pulmonary effort is normal.     Breath sounds: Normal breath sounds.  Skin:    General: Skin is warm and dry.  Neurological:     Mental Status: She is alert.  Psychiatric:        Mood and Affect: Mood normal.      No results found for any visits on 08/30/23.    The 10-year  ASCVD risk score (Arnett DK, et al., 2019) is: 2.9%    Assessment & Plan:   Problem List Items Addressed This Visit       Cardiovascular and Mediastinum   Essential hypertension, benign   Well controlled. Continue current regimen. Follow up in  57mo         Endocrine   Microalbuminuria due to type 2 diabetes mellitus (HCC)   Diabetes type 2, controlled (HCC) - Primary   Lab Results  Component Value Date   HGBA1C 5.7 (H) 07/27/2023   Looks good. Keep up the good work.         Other   Vitamin B12 deficiency   Due for b12 injection today      Other Visit Diagnoses       Left ear pain       Relevant Orders   Ambulatory referral to ENT      Left ear pain, possible new perforation though she has a prior history of perforation so difficult to say no active drainage or infection on exam.  Will refer to ENT for  further workup.  Return in about 4 months (around 12/30/2023) for Diabetes follow-up, Hypertension.    Megan German, MD

## 2023-08-31 ENCOUNTER — Ambulatory Visit

## 2023-09-05 ENCOUNTER — Other Ambulatory Visit: Payer: Self-pay

## 2023-09-05 ENCOUNTER — Encounter (HOSPITAL_BASED_OUTPATIENT_CLINIC_OR_DEPARTMENT_OTHER): Payer: Self-pay | Admitting: Orthopaedic Surgery

## 2023-09-05 NOTE — Progress Notes (Signed)
   09/05/23 1313  Pre-op Phone Call  Surgery Date Verified 09/12/23  Arrival Time Verified 0900  Surgery Location Verified [facility] [place]  Medical History Reviewed Yes  Is the patient taking a GLP-1 receptor agonist? Yes (09/04/23)  Has the patient been informed on holding medication? Yes  Does the patient have diabetes? Type II  Does the patient use a Continuous Blood Glucose Monitor? No  Is the patient on an insulin pump? No  Has the diabetes coordinator been notified? No  Do you have a history of heart problems? No  Does patient have other implanted devices? No  Patient Teaching Enhanced Recovery;Pre / Post Procedure  Patient educated about smoking cessation 24 hours prior to surgery. N/A Non-Smoker  Patient verbalizes understanding of bowel prep? N/A  THA/TKA patients only:  By your surgery date, will you have been taking narcotics for 90 days or greater? No  Med Rec Completed Yes  Take the Following Meds the Morning of Surgery Take Noravasc  Recent  Lab Work, EKG, CXR? No  NPO (Including gum & candy) After midnight  Allowed clear liquids Juice (not-citric and without pulp - diabetics please choose diet or no sugar options);Gatorade  (diabetics please choose diet or no sugar options)  Patient instructed to stop clear liquids including Carb loading drink at: 0700  Stop Solids, Milk, Candy, and Gum STARTING AT MIDNIGHT  Did patient view EMMI videos? No  Responsible adult to drive and be with you for 24 hours? Yes  Name & Phone Number for Ride/Caregiver Megan Way  No Jewelry, money, nail polish or make-up.  No lotions, powders, perfumes. No shaving  48 hrs. prior to surgery. Yes  Contacts, Dentures & Glasses Will Have to be Removed Before OR. Yes  Please bring your ID and Insurance Card the morning of your surgery. (Surgery Centers Only) Yes  Bring any papers or x-rays with you that your surgeon gave you. Yes  Instructed to contact the location of procedure/ provider if they or anyone in  their household develops symptoms or tests positive for COVID-19, has close contact with someone who tests positive for COVID, or has known exposure to any contagious illness. Yes  Call this number the morning of surgery  with any problems that may cancel your surgery. [phone]  Covid-19 Assessment  Have you had a positive COVID-19 test within the previous 90 days? No  COVID Testing Guidance Proceed with the additional questions.  Patient's surgery required a COVID-19 test (cardiothoracic, complex ENT, and bronchoscopies/ EBUS) No  Have you been unmasked and in close contact with anyone with COVID-19 or COVID-19 symptoms within the past 10 days? No  Do you or anyone in your household currently have any COVID-19 symptoms? No

## 2023-09-07 ENCOUNTER — Encounter (HOSPITAL_BASED_OUTPATIENT_CLINIC_OR_DEPARTMENT_OTHER)
Admission: RE | Admit: 2023-09-07 | Discharge: 2023-09-07 | Disposition: A | Discharge status: Home / Self Care | Source: Ambulatory Visit | Attending: Orthopaedic Surgery | Admitting: Orthopaedic Surgery

## 2023-09-07 DIAGNOSIS — Z01812 Encounter for preprocedural laboratory examination: Secondary | ICD-10-CM | POA: Diagnosis not present

## 2023-09-07 DIAGNOSIS — Z01818 Encounter for other preprocedural examination: Secondary | ICD-10-CM | POA: Diagnosis present

## 2023-09-07 LAB — BASIC METABOLIC PANEL WITH GFR
Anion gap: 9 (ref 5–15)
BUN: 17 mg/dL (ref 6–20)
CO2: 25 mmol/L (ref 22–32)
Calcium: 9.3 mg/dL (ref 8.9–10.3)
Chloride: 106 mmol/L (ref 98–111)
Creatinine, Ser: 0.9 mg/dL (ref 0.44–1.00)
GFR, Estimated: >60 mL/min (ref >60)
Glucose, Bld: 94 mg/dL (ref 70–99)
Potassium: 3.5 mmol/L (ref 3.5–5.1)
Sodium: 140 mmol/L (ref 135–145)

## 2023-09-07 NOTE — Progress Notes (Signed)

## 2023-09-11 NOTE — Anesthesia Preprocedure Evaluation (Signed)
 Anesthesia Evaluation  Patient identified by MRN, date of birth, ID band Patient awake    Reviewed: Allergy & Precautions, NPO status , Patient's Chart, lab work & pertinent test results  Airway Mallampati: III  TM Distance: >3 FB Neck ROM: Full    Dental  (+) Teeth Intact, Dental Advisory Given   Pulmonary neg pulmonary ROS Snores at night, no sleep study    Pulmonary exam normal breath sounds clear to auscultation       Cardiovascular hypertension (149/74 preop, per pt normally 120s-130s SBP), Pt. on medications Normal cardiovascular exam Rhythm:Regular Rate:Normal     Neuro/Psych  PSYCHIATRIC DISORDERS Anxiety     negative neurological ROS     GI/Hepatic negative GI ROS, Neg liver ROS,,,  Endo/Other  diabetes, Well Controlled, Type 2  Obesity BMI 33  Renal/GU negative Renal ROS  negative genitourinary   Musculoskeletal negative musculoskeletal ROS (+)    Abdominal  (+) + obese  Peds  Hematology negative hematology ROS (+)   Anesthesia Other Findings Ozempic  LD: 8d  Reproductive/Obstetrics negative OB ROS                             Anesthesia Physical Anesthesia Plan  ASA: 2  Anesthesia Plan: General and Regional   Post-op Pain Management: Tylenol  PO (pre-op)*, Toradol  IV (intra-op)* and Regional block*   Induction: Intravenous  PONV Risk Score and Plan: 3 and Ondansetron , Dexamethasone , Midazolam  and Treatment may vary due to age or medical condition  Airway Management Planned: LMA  Additional Equipment: None  Intra-op Plan:   Post-operative Plan: Extubation in OR  Informed Consent: I have reviewed the patients History and Physical, chart, labs and discussed the procedure including the risks, benefits and alternatives for the proposed anesthesia with the patient or authorized representative who has indicated his/her understanding and acceptance.     Dental advisory  given  Plan Discussed with: CRNA  Anesthesia Plan Comments:        Anesthesia Quick Evaluation

## 2023-09-12 ENCOUNTER — Ambulatory Visit (HOSPITAL_COMMUNITY)

## 2023-09-12 ENCOUNTER — Encounter (HOSPITAL_BASED_OUTPATIENT_CLINIC_OR_DEPARTMENT_OTHER): Payer: Self-pay | Admitting: Orthopaedic Surgery

## 2023-09-12 ENCOUNTER — Encounter (HOSPITAL_BASED_OUTPATIENT_CLINIC_OR_DEPARTMENT_OTHER)
Admission: RE | Disposition: A | Discharge status: Home / Self Care | Payer: Self-pay | Source: Home / Self Care | Attending: Orthopaedic Surgery

## 2023-09-12 ENCOUNTER — Other Ambulatory Visit: Payer: Self-pay

## 2023-09-12 ENCOUNTER — Ambulatory Visit (HOSPITAL_BASED_OUTPATIENT_CLINIC_OR_DEPARTMENT_OTHER): Payer: Self-pay | Admitting: Anesthesiology

## 2023-09-12 ENCOUNTER — Ambulatory Visit (HOSPITAL_BASED_OUTPATIENT_CLINIC_OR_DEPARTMENT_OTHER)
Admission: RE | Admit: 2023-09-12 | Discharge: 2023-09-12 | Disposition: A | Discharge status: Home / Self Care | Attending: Orthopaedic Surgery | Admitting: Orthopaedic Surgery

## 2023-09-12 DIAGNOSIS — M25362 Other instability, left knee: Secondary | ICD-10-CM

## 2023-09-12 DIAGNOSIS — E669 Obesity, unspecified: Secondary | ICD-10-CM | POA: Insufficient documentation

## 2023-09-12 DIAGNOSIS — E119 Type 2 diabetes mellitus without complications: Secondary | ICD-10-CM | POA: Diagnosis not present

## 2023-09-12 DIAGNOSIS — I10 Essential (primary) hypertension: Secondary | ICD-10-CM | POA: Diagnosis not present

## 2023-09-12 DIAGNOSIS — M2242 Chondromalacia patellae, left knee: Secondary | ICD-10-CM | POA: Diagnosis not present

## 2023-09-12 DIAGNOSIS — Z79899 Other long term (current) drug therapy: Secondary | ICD-10-CM | POA: Diagnosis not present

## 2023-09-12 DIAGNOSIS — Z7982 Long term (current) use of aspirin: Secondary | ICD-10-CM | POA: Diagnosis not present

## 2023-09-12 DIAGNOSIS — Z833 Family history of diabetes mellitus: Secondary | ICD-10-CM | POA: Insufficient documentation

## 2023-09-12 DIAGNOSIS — R0683 Snoring: Secondary | ICD-10-CM | POA: Diagnosis not present

## 2023-09-12 DIAGNOSIS — F419 Anxiety disorder, unspecified: Secondary | ICD-10-CM | POA: Insufficient documentation

## 2023-09-12 DIAGNOSIS — M2352 Chronic instability of knee, left knee: Secondary | ICD-10-CM | POA: Diagnosis not present

## 2023-09-12 DIAGNOSIS — Z419 Encounter for procedure for purposes other than remedying health state, unspecified: Secondary | ICD-10-CM

## 2023-09-12 DIAGNOSIS — Z6833 Body mass index (BMI) 33.0-33.9, adult: Secondary | ICD-10-CM | POA: Insufficient documentation

## 2023-09-12 DIAGNOSIS — Z01818 Encounter for other preprocedural examination: Secondary | ICD-10-CM

## 2023-09-12 HISTORY — PX: MEDIAL PATELLOFEMORAL LIGAMENT REPAIR: SHX2020

## 2023-09-12 HISTORY — PX: IMPLANTATION, MATRIX-INDUCED AUTOLOGOUS CHONDROCYTES: SHX7593

## 2023-09-12 LAB — POCT PREGNANCY, URINE: Preg Test, Ur: NEGATIVE

## 2023-09-12 LAB — GLUCOSE, CAPILLARY
Glucose-Capillary: 90 mg/dL (ref 70–99)
Glucose-Capillary: 125 mg/dL — ABNORMAL HIGH (ref 70–99)

## 2023-09-12 SURGERY — RECONSTRUCTION, LIGAMENT, MEDIAL PATELLOFEMORAL
Anesthesia: Regional | Site: Knee | Laterality: Left

## 2023-09-12 MED ORDER — ONDANSETRON HCL 4 MG/2ML IJ SOLN
INTRAMUSCULAR | Status: AC
  Filled 2023-09-12: qty 2

## 2023-09-12 MED ORDER — ROPIVACAINE HCL 5 MG/ML IJ SOLN
INTRAMUSCULAR | Status: DC | PRN
  Administered 2023-09-12: 30 mL via PERINEURAL

## 2023-09-12 MED ORDER — PROPOFOL 10 MG/ML IV BOLUS
INTRAVENOUS | Status: AC
  Filled 2023-09-12: qty 20

## 2023-09-12 MED ORDER — TISSEEL 10 ML EX KIT
PACK | CUTANEOUS | Status: DC | PRN
  Administered 2023-09-12: 10 mL via TOPICAL

## 2023-09-12 MED ORDER — TRANEXAMIC ACID-NACL 1000-0.7 MG/100ML-% IV SOLN
INTRAVENOUS | Status: AC
  Filled 2023-09-12: qty 100

## 2023-09-12 MED ORDER — ACETAMINOPHEN 500 MG PO TABS: 1000.0000 mg | ORAL_TABLET | Freq: Once | ORAL | Status: AC

## 2023-09-12 MED ORDER — GABAPENTIN 300 MG PO CAPS
300.0000 mg | ORAL_CAPSULE | Freq: Once | ORAL | Status: AC
  Administered 2023-09-12: 300 mg via ORAL

## 2023-09-12 MED ORDER — LIDOCAINE 2% (20 MG/ML) 5 ML SYRINGE
INTRAMUSCULAR | Status: AC
  Filled 2023-09-12: qty 5

## 2023-09-12 MED ORDER — FENTANYL CITRATE (PF) 100 MCG/2ML IJ SOLN
INTRAMUSCULAR | Status: AC
  Filled 2023-09-12: qty 2

## 2023-09-12 MED ORDER — ONDANSETRON HCL 4 MG/2ML IJ SOLN
INTRAMUSCULAR | Status: DC | PRN
  Administered 2023-09-12: 4 mg via INTRAVENOUS

## 2023-09-12 MED ORDER — HYDROMORPHONE HCL 1 MG/ML IJ SOLN
INTRAMUSCULAR | Status: AC
  Filled 2023-09-12: qty 0.5

## 2023-09-12 MED ORDER — EPHEDRINE 5 MG/ML INJ
INTRAVENOUS | Status: AC
  Filled 2023-09-12: qty 5

## 2023-09-12 MED ORDER — MIDAZOLAM HCL 2 MG/2ML IJ SOLN
2.0000 mg | Freq: Once | INTRAMUSCULAR | Status: AC
  Administered 2023-09-12: 2 mg via INTRAVENOUS

## 2023-09-12 MED ORDER — MEPERIDINE HCL 25 MG/ML IJ SOLN: 6.2500 mg | INTRAMUSCULAR | Status: DC | PRN

## 2023-09-12 MED ORDER — GABAPENTIN 300 MG PO CAPS
ORAL_CAPSULE | ORAL | Status: AC
  Filled 2023-09-12: qty 1

## 2023-09-12 MED ORDER — OXYCODONE HCL 5 MG PO TABS
ORAL_TABLET | ORAL | Status: AC
  Filled 2023-09-12: qty 1

## 2023-09-12 MED ORDER — KETOROLAC TROMETHAMINE 30 MG/ML IJ SOLN
INTRAMUSCULAR | Status: AC
  Filled 2023-09-12: qty 1

## 2023-09-12 MED ORDER — FENTANYL CITRATE (PF) 100 MCG/2ML IJ SOLN
100.0000 ug | Freq: Once | INTRAMUSCULAR | Status: AC
  Administered 2023-09-12: 100 ug via INTRAVENOUS

## 2023-09-12 MED ORDER — PHENYLEPHRINE HCL (PRESSORS) 10 MG/ML IV SOLN
INTRAVENOUS | Status: DC | PRN
  Administered 2023-09-12: 160 ug via INTRAVENOUS
  Administered 2023-09-12 (×2): 80 ug via INTRAVENOUS

## 2023-09-12 MED ORDER — TRANEXAMIC ACID-NACL 1000-0.7 MG/100ML-% IV SOLN
1000.0000 mg | INTRAVENOUS | Status: AC
  Administered 2023-09-12: 1000 mg via INTRAVENOUS

## 2023-09-12 MED ORDER — AMISULPRIDE (ANTIEMETIC) 5 MG/2ML IV SOLN
INTRAVENOUS | Status: AC
  Filled 2023-09-12: qty 2

## 2023-09-12 MED ORDER — DEXAMETHASONE SODIUM PHOSPHATE 10 MG/ML IJ SOLN
INTRAMUSCULAR | Status: DC | PRN
  Administered 2023-09-12: 10 mg

## 2023-09-12 MED ORDER — ACETAMINOPHEN 500 MG PO TABS
1000.0000 mg | ORAL_TABLET | Freq: Once | ORAL | Status: AC
  Administered 2023-09-12: 1000 mg via ORAL

## 2023-09-12 MED ORDER — PHENYLEPHRINE 80 MCG/ML (10ML) SYRINGE FOR IV PUSH (FOR BLOOD PRESSURE SUPPORT)
PREFILLED_SYRINGE | INTRAVENOUS | Status: AC
  Filled 2023-09-12: qty 10

## 2023-09-12 MED ORDER — MIDAZOLAM HCL 2 MG/2ML IJ SOLN
INTRAMUSCULAR | Status: AC
  Filled 2023-09-12: qty 2

## 2023-09-12 MED ORDER — OXYCODONE HCL 5 MG PO TABS
5.0000 mg | ORAL_TABLET | Freq: Once | ORAL | Status: AC | PRN
  Administered 2023-09-12: 5 mg via ORAL

## 2023-09-12 MED ORDER — OXYCODONE HCL 5 MG/5ML PO SOLN: 5.0000 mg | Freq: Once | ORAL | Status: AC | PRN

## 2023-09-12 MED ORDER — CEFAZOLIN SODIUM-DEXTROSE 2-4 GM/100ML-% IV SOLN
2.0000 g | INTRAVENOUS | Status: AC
  Administered 2023-09-12: 2 g via INTRAVENOUS

## 2023-09-12 MED ORDER — KETOROLAC TROMETHAMINE 30 MG/ML IJ SOLN
30.0000 mg | Freq: Once | INTRAMUSCULAR | Status: AC | PRN
  Administered 2023-09-12: 30 mg via INTRAVENOUS

## 2023-09-12 MED ORDER — PROPOFOL 10 MG/ML IV BOLUS
INTRAVENOUS | Status: DC | PRN
  Administered 2023-09-12: 200 mg via INTRAVENOUS

## 2023-09-12 MED ORDER — LIDOCAINE HCL (CARDIAC) PF 100 MG/5ML IV SOSY
PREFILLED_SYRINGE | INTRAVENOUS | Status: DC | PRN
  Administered 2023-09-12: 40 mg via INTRAVENOUS

## 2023-09-12 MED ORDER — CEFAZOLIN SODIUM-DEXTROSE 2-4 GM/100ML-% IV SOLN
INTRAVENOUS | Status: AC
  Filled 2023-09-12: qty 100

## 2023-09-12 MED ORDER — LACTATED RINGERS IV SOLN: INTRAVENOUS | Status: DC

## 2023-09-12 MED ORDER — AMISULPRIDE (ANTIEMETIC) 5 MG/2ML IV SOLN
10.0000 mg | Freq: Once | INTRAVENOUS | Status: AC | PRN
  Administered 2023-09-12: 10 mg via INTRAVENOUS

## 2023-09-12 MED ORDER — DEXAMETHASONE SODIUM PHOSPHATE 10 MG/ML IJ SOLN
INTRAMUSCULAR | Status: AC
  Filled 2023-09-12: qty 1

## 2023-09-12 MED ORDER — ACETAMINOPHEN 500 MG PO TABS
ORAL_TABLET | ORAL | Status: AC
  Filled 2023-09-12: qty 2

## 2023-09-12 MED ORDER — 0.9 % SODIUM CHLORIDE (POUR BTL) OPTIME
TOPICAL | Status: DC | PRN
  Administered 2023-09-12: 1000 mL

## 2023-09-12 MED ORDER — ONDANSETRON HCL 4 MG/2ML IJ SOLN: 4.0000 mg | Freq: Once | INTRAMUSCULAR | Status: DC | PRN

## 2023-09-12 MED ORDER — HYDROMORPHONE HCL 1 MG/ML IJ SOLN
0.2500 mg | INTRAMUSCULAR | Status: DC | PRN
  Administered 2023-09-12 (×2): 0.5 mg via INTRAVENOUS

## 2023-09-12 MED ORDER — EPHEDRINE SULFATE (PRESSORS) 50 MG/ML IJ SOLN
INTRAMUSCULAR | Status: DC | PRN
  Administered 2023-09-12: 5 mg via INTRAVENOUS

## 2023-09-12 MED ORDER — DEXAMETHASONE SODIUM PHOSPHATE 10 MG/ML IJ SOLN
INTRAMUSCULAR | Status: DC | PRN
  Administered 2023-09-12: 5 mg via INTRAVENOUS

## 2023-09-12 MED ORDER — FENTANYL CITRATE (PF) 100 MCG/2ML IJ SOLN
INTRAMUSCULAR | Status: DC | PRN
  Administered 2023-09-12 (×2): 50 ug via INTRAVENOUS

## 2023-09-12 MED ORDER — DEXMEDETOMIDINE HCL IN NACL 80 MCG/20ML IV SOLN
INTRAVENOUS | Status: DC | PRN
  Administered 2023-09-12 (×2): 8 ug via INTRAVENOUS
  Administered 2023-09-12: 4 ug via INTRAVENOUS

## 2023-09-12 SURGICAL SUPPLY — 48 items
ANCHOR DBL 2.6 SLF-PNCH FIBRTK (Anchor) IMPLANT
BANDAGE ESMARK 6X9 LF (GAUZE/BANDAGES/DRESSINGS) IMPLANT
BLADE SURG 15 STRL LF DISP TIS (BLADE) ×2 IMPLANT
BNDG ELASTIC 4INX 5YD STR LF (GAUZE/BANDAGES/DRESSINGS) ×1 IMPLANT
BNDG ELASTIC 6INX 5YD STR LF (GAUZE/BANDAGES/DRESSINGS) ×1 IMPLANT
CHLORAPREP W/TINT 26 (MISCELLANEOUS) ×1 IMPLANT
COOLER ICEMAN CLASSIC (MISCELLANEOUS) ×1 IMPLANT
DRAPE C-ARM 42X72 X-RAY (DRAPES) IMPLANT
DRAPE C-ARMOR (DRAPES) IMPLANT
DRAPE EXTREMITY T 121X128X90 (DISPOSABLE) ×1 IMPLANT
DRAPE IMP U-DRAPE 54X76 (DRAPES) IMPLANT
DRAPE INCISE IOBAN 66X45 STRL (DRAPES) IMPLANT
DRAPE U-SHAPE 47X51 STRL (DRAPES) ×1 IMPLANT
ELECTRODE REM PT RTRN 9FT ADLT (ELECTROSURGICAL) IMPLANT
GAUZE 4X4 16PLY ~~LOC~~+RFID DBL (SPONGE) IMPLANT
GAUZE PAD ABD 8X10 STRL (GAUZE/BANDAGES/DRESSINGS) ×1 IMPLANT
GAUZE SPONGE 4X4 12PLY STRL (GAUZE/BANDAGES/DRESSINGS) ×1 IMPLANT
GAUZE XEROFORM 1X8 LF (GAUZE/BANDAGES/DRESSINGS) ×1 IMPLANT
GLOVE BIO SURGEON STRL SZ 6 (GLOVE) ×1 IMPLANT
GLOVE BIO SURGEON STRL SZ7.5 (GLOVE) ×1 IMPLANT
GLOVE BIOGEL PI IND STRL 6.5 (GLOVE) ×1 IMPLANT
GLOVE BIOGEL PI IND STRL 8 (GLOVE) ×1 IMPLANT
GOWN STRL REUS W/ TWL LRG LVL3 (GOWN DISPOSABLE) ×1 IMPLANT
GOWN STRL REUS W/ TWL XL LVL3 (GOWN DISPOSABLE) ×1 IMPLANT
GRAFT TISS 230-320 GRACILIS (Bone Implant) IMPLANT
IMMOBILIZER KNEE 24 THIGH 36 (MISCELLANEOUS) IMPLANT
IMMOBILIZER KNEE 24 UNIV (MISCELLANEOUS) IMPLANT
IMPL SYS 2ND FX PEEK 4.75X19.1 (Miscellaneous) IMPLANT
KIT KNEE FIBERTAK DISP (KITS) IMPLANT
KWIRE DBL .062X4 NSTRL (WIRE) IMPLANT
PACK ARTHROSCOPY DSU (CUSTOM PROCEDURE TRAY) ×1 IMPLANT
PACK BASIN DAY SURGERY FS (CUSTOM PROCEDURE TRAY) ×1 IMPLANT
PAD COLD SHLDR WRAP-ON (PAD) ×1 IMPLANT
PADDING CAST COTTON 6X4 STRL (CAST SUPPLIES) ×1 IMPLANT
PENCIL SMOKE EVACUATOR (MISCELLANEOUS) IMPLANT
SCAFFOLD CELL AUTOLOGOUS MACI (Tissue) IMPLANT
SLEEVE SCD COMPRESS KNEE MED (STOCKING) ×1 IMPLANT
SPONGE INTESTINAL PEANUT (DISPOSABLE) ×1 IMPLANT
SPONGE T-LAP 18X18 ~~LOC~~+RFID (SPONGE) ×2 IMPLANT
SUT CHROMIC 3 0 SH 27 (SUTURE) ×1 IMPLANT
SUT ETHILON 3 0 PS 1 (SUTURE) ×1 IMPLANT
SUT VIC AB 0 CT1 27XBRD ANBCTR (SUTURE) ×1 IMPLANT
SUT VIC AB 2-0 CT1 TAPERPNT 27 (SUTURE) ×1 IMPLANT
SUTURE TAPE TIGERLINK 1.3MM BL (SUTURE) IMPLANT
SYR BULB EAR ULCER 3OZ GRN STR (SYRINGE) IMPLANT
TENDON GRACILIS FROZEN 230-320 (Bone Implant) ×1 IMPLANT
TOWEL GREEN STERILE FF (TOWEL DISPOSABLE) ×2 IMPLANT
TUBE SUCTION HIGH CAP CLEAR NV (SUCTIONS) IMPLANT

## 2023-09-12 NOTE — Anesthesia Postprocedure Evaluation (Signed)
 Anesthesia Post Note  Patient: Megan [name]  Procedure(s) Performed: RECONSTRUCTION, LIGAMENT, MEDIAL PATELLOFEMORAL (Left: Knee) IMPLANTATION, MATRIX-INDUCED AUTOLOGOUS CHONDROCYTES (Left: Knee)     Patient location during evaluation: PACU Anesthesia Type: Regional and General Level of consciousness: awake and alert, oriented and patient cooperative Pain management: pain level controlled Vital Signs Assessment: post-procedure vital signs reviewed and stable Respiratory status: spontaneous breathing, nonlabored ventilation and respiratory function stable Cardiovascular status: blood pressure returned to baseline and stable Postop Assessment: no apparent nausea or vomiting Anesthetic complications: no   No notable events documented.  Last Vitals:  Vitals:   09/12/23 1330 09/12/23 1345  BP: 125/71   Pulse: 96 94  Resp: 20 13  Temp:    SpO2: 98% 99%    Last Pain:  Vitals:   09/12/23 1345  TempSrc:   PainSc: 4                  Jacquelyne Matte

## 2023-09-12 NOTE — Transfer of Care (Signed)
 Immediate Anesthesia Transfer of Care Note  Patient: Megan [name]  Procedure(s) Performed: RECONSTRUCTION, LIGAMENT, MEDIAL PATELLOFEMORAL (Left: Knee) IMPLANTATION, MATRIX-INDUCED AUTOLOGOUS CHONDROCYTES (Left: Knee)  Patient Location: PACU  Anesthesia Type:GA combined with regional for post-op pain  Level of Consciousness: awake, alert , and oriented  Airway & Oxygen Therapy: Patient Spontanous Breathing and Patient connected to face mask oxygen  Post-op Assessment: Report given to RN and Post -op Vital signs reviewed and stable  Post vital signs: Reviewed and stable  Last Vitals:  Vitals Value Taken Time  BP 108/64 09/12/23 1311  Temp 36.7 C 09/12/23 1311  Pulse 112 09/12/23 1314  Resp 21 09/12/23 1314  SpO2 96 % 09/12/23 1314  Vitals shown include unfiled device data.  Last Pain:  Vitals:   09/12/23 1311  TempSrc:   PainSc: 7       Patients Stated Pain Goal: 3 (09/12/23 0919)  Complications: No notable events documented.

## 2023-09-12 NOTE — Anesthesia Procedure Notes (Signed)
 Procedure Name: LMA Insertion Date/Time: 09/12/2023 11:47 AM  Performed by: Leverne Reading, CRNAPre-anesthesia Checklist: Patient identified, Emergency Drugs available, Suction available and Patient being monitored Patient Re-evaluated:Patient Re-evaluated prior to induction Oxygen Delivery Method: Circle system utilized Preoxygenation: Pre-oxygenation with 100% oxygen Induction Type: IV induction LMA: LMA inserted LMA Size: 4.0 Number of attempts: 1 Placement Confirmation: positive ETCO2 Tube secured with: Tape Dental Injury: Teeth and Oropharynx as per pre-operative assessment

## 2023-09-12 NOTE — H&P (Signed)
 Post Operative Evaluation      Procedure/Date of Surgery: Status post left knee cartilage biopsy 3/17   Interval History:    Presents today for first postop status post left knee cartilage biopsy.  Diagnostic arthroscopy did confirm a 3 x 2 cm lesion over the central trochlea as well as a 2 x 2-1/2 lesion over the median undersurface ridge of the patella.  She is overall doing well after the surgery.  Minimal swelling.  Range of motion is normalized     PMH/PSH/Family History/Social History/Meds/Allergies:         Past Medical History:  Diagnosis Date   Gestational diabetes mellitus in childbirth     Hypertension               Past Surgical History:  Procedure Laterality Date   KNEE ARTHROSCOPY WITH MACI  CARTILAGE HARVEST Left 07/24/2023    Procedure: ARTHROSCOPY, KNEE, WITH CARTILAGE PROCUREMENT FOR CULTURE;  Surgeon: Wilhelmenia Harada, MD;  Location: [facility];  Service: Orthopedics;  Laterality: Left;  LEFT KNEE ARTHROSCOPY WITH MACI  HARVEST   TONSILLECTOMY AND ADENOIDECTOMY       TYMPANOSTOMY TUBE PLACEMENT   1984        Social History         Socioeconomic History   Marital status: Single      Spouse name: Abrienne Turrill   Number of children: Not on file   Years of education: Not on file   Highest education level: Associate degree: academic program  Occupational History   Occupation: Asst Diplomatic Services operational officer      Comment: [employer]   Tobacco Use   Smoking status: Never   Smokeless tobacco: Never  Vaping Use   Vaping status: Never Used  Substance and Sexual Activity   Alcohol use: No   Drug use: No   Sexual activity: Yes      Partners: Male  Other Topics Concern   Not on file  Social History Narrative    2 caffeine  drinks per day. No regular exercise. She works full-time and is going to school.    Social Drivers of Acupuncturist Strain: Low Risk  (02/27/2023)    Overall Financial Resource  Strain (CARDIA)     Difficulty of Paying Living Expenses: Not very hard  Food Insecurity: No Food Insecurity (02/27/2023)    Hunger Vital Sign     Worried About Running Out of Food in the Last Year: Never true     Ran Out of Food in the Last Year: Never true  Transportation Needs: No Transportation Needs (02/27/2023)    PRAPARE - Therapist, art (Medical): No     Lack of Transportation (Non-Medical): No  Physical Activity: Unknown (08/24/2022)    Exercise Vital Sign     Days of Exercise per Week: Patient declined     Minutes of Exercise per Session: Not on file  Stress: No Stress Concern Present (08/24/2022)    [employer] of Occupational Health - Occupational Stress Questionnaire     Feeling of Stress : Only a little  Social Connections: Socially Isolated (02/27/2023)    Social Connection and Isolation Panel [NHANES]     Frequency of Communication with Friends and Family: Once a week     Frequency of Social Gatherings with Friends and Family: Once a week     Attends Religious Services: Never     Active Member  of Clubs or Organizations: No     Attends Engineer, structural: Not on file     Marital Status: Never married         Family History  Problem Relation Age of Onset   Hypertension Mother     Celiac disease Mother     Hypertension Father     Hypertension Sister     Hypertension Brother     Peripheral vascular disease Maternal Grandmother     Diabetes Paternal Aunt     Diabetes Maternal Uncle     Diabetes Paternal Aunt     Colon cancer Maternal Grandfather     Crohn's disease Cousin          Allergies       Allergies  Allergen Reactions   Metformin  And Related Other (See Comments)      GI upset    Sertraline  Nausea Only      sweats            Current Outpatient Medications  Medication Sig Dispense Refill   acetaminophen  (TYLENOL ) 500 MG tablet Take 1 tablet (500 mg total) by mouth every 8 (eight) hours for 10 days. 30  tablet 0   aspirin  EC 325 MG tablet Take 1 tablet (325 mg total) by mouth daily. 14 tablet 0   ibuprofen  (ADVIL ) 800 MG tablet Take 1 tablet (800 mg total) by mouth every 8 (eight) hours for 10 days. Please take with food, please alternate with acetaminophen  30 tablet 0   oxyCODONE  (ROXICODONE ) 5 MG immediate release tablet Take 1 tablet (5 mg total) by mouth every 4 (four) hours as needed for severe pain (pain score 7-10) or breakthrough pain. 15 tablet 0   AMBULATORY NON FORMULARY MEDICATION One touch ultra 2 glucose system   Use to test once daily as directed   Please schedule f/u appt 1 each 0   amLODipine  (NORVASC ) 5 MG tablet TAKE 1 TABLET BY MOUTH DAILY 90 tablet 3   atorvastatin  (LIPITOR) 40 MG tablet Take 1 tablet (40 mg total) by mouth daily. 90 tablet 3   losartan -hydrochlorothiazide  (HYZAAR) 100-12.5 MG tablet TAKE 1 TABLET BY MOUTH DAILY 90 tablet 3   norgestrel-ethinyl estradiol (LO/OVRAL,CRYSELLE) 0.3-30 MG-MCG tablet Take 1 tablet by mouth daily.       Semaglutide , 2 MG/DOSE, (OZEMPIC , 2 MG/DOSE,) 8 MG/3ML SOPN Inject 2 mg (0.75 ml) into the skin once a week. 9 mL 2      No current facility-administered medications for this visit.      Imaging Results (Last 48 hours)  No results found.     Review of Systems:   A ROS was performed including pertinent positives and negatives as documented in the HPI.     Musculoskeletal Exam:     There were no vitals taken for this visit.   Left knee incisions are well-appearing without erythema or drainage.  Persistent lateral apprehension with 4 quadrants of lateral patellar motion.  This is particularly in hyperextension.  Positive J sign.  Range of motion is from 0 to 130 degrees.  No joint line tenderness negative McMurray, negative Lachman, negative posterior drawer   Imaging:         I personally reviewed and interpreted the radiographs.     Assessment:   2 weeks status post the above biopsy.  She does have confirmed  patella and trochlear lesions measuring 2-1/2 x 2 cm and 3 x 2 cm respectively.  Overall given the fact that she does  have recurrent patellar instability and is now had a dislocation event as well we did discuss that I do believe she be a candidate for MACI  placement as well as MPFL reconstruction.  I did discuss the risks and benefit as well as limitations.  She has had multiple episodes of instability despite physical therapy.  Given this I did also discuss the recovery timeframe.  She has elected for this after discussion   Plan :     -Plan for left knee matrix associated chondrocyte implantation patella and and trochlea with MPFL reconstruction     After a lengthy discussion of treatment options, including risks, benefits, alternatives, complications of surgical and nonsurgical conservative options, the patient elected surgical repair.    The patient  is aware of the material risks  and complications including, but not limited to injury to adjacent structures, neurovascular injury, infection, numbness, bleeding, implant failure, thermal burns, stiffness, persistent pain, failure to heal, disease transmission from allograft, need for further surgery, dislocation, anesthetic risks, blood clots, risks of death,and others. The probabilities of surgical success and failure discussed with patient given their particular co-morbidities.The time and nature of expected rehabilitation and recovery was discussed.The patient's questions were all answered preoperatively.  No barriers to understanding were noted. I explained the natural history of the disease process and Rx rationale.  I explained to the patient what I considered to be reasonable expectations given their personal situation.  The final treatment plan was arrived at through a shared patient decision making process model.             I personally saw and evaluated the patient, and participated in the management and treatment plan.   Wilhelmenia Harada, MD Attending Physician, Orthopedic Surgery   This document was dictated using Dragon voice recognition software. A reasonable attempt at proof reading has been made to minimize errors.

## 2023-09-12 NOTE — Op Note (Signed)
 Date of Surgery: 09/12/2023  INDICATIONS: Ms. 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  The risk and benefits of the procedure were discussed in detail and documented in the pre-operative evaluation.   PREOPERATIVE DIAGNOSIS: 1. Left knee patella instability 2. Left knee 3x2 cm lateral trochlear chondral loss, 2x2 central patella chondral loss  POSTOPERATIVE DIAGNOSIS: Same.  PROCEDURE: 1. Left knee medial patellofemoral ligament reconstruction 2. Left knee matrix associated chondrocyte implantation patella defect, trochlear defect  SURGEON: Carmina Chris MD  ASSISTANT: Deon Flatter, ATC  ANESTHESIA:  general plus adductor canal block  IV FLUIDS AND URINE: See anesthesia record.  ANTIBIOTICS: Ancef   ESTIMATED BLOOD LOSS: 25 mL.  IMPLANTS:  Implant Name Type Inv. Item Serial No. Manufacturer Lot No. LRB No. Used Action  ANCHOR DBL 2.6 SLF-PNCH FIBRTK - [id number] Anchor ANCHOR DBL 2.6 SLF-PNCH FIBRTK  [employer] [id number] Left 1 Implanted  ANCHOR DBL 2.6 SLF-PNCH FIBRTK - [id number] Anchor ANCHOR DBL 2.6 SLF-PNCH FIBRTK  [employer] [id number] Left 1 Implanted  IMPL SYS 2ND FX PEEK 4.75X19.1 - [id number] Miscellaneous IMPL SYS 2ND FX PEEK 4.75X19.1  [employer] [id number] Left 1 Implanted  TENDON GRACILIS FROZEN 230-320 - [id number] Bone Implant TENDON GRACILIS FROZEN [id number] [facility] [id number] Left 1 Implanted  MACI  autologous chondrocytes     [id number] Left 1 Implanted    DRAINS: None  CULTURES: None  COMPLICATIONS: none  DESCRIPTION OF PROCEDURE:   I identified the patient in the pre-operative holding area.  I marked the operative right knee with my initials. I reviewed the risks and benefits of the proposed surgical intervention and the patient wished to proceed.  Anesthesia was then performed with regional block.  The patient was  transferred to the operative suite and placed in the supine position with all bony prominences padded.     SCDs were placed on the non-operative lower extremity. Appropriate antibiotics was administered within 1 hour before incision. The operative extremity was then prepped and draped in standard fashion. A time out was performed confirming the correct extremity, correct patient and correct procedure.   I began with a central approach to the patellofemoral joint.  15 blade was used to incise through the midline.  Electrocautery was used to achieve hemostasis.  At this time the dissection was performed in order to identify between layer 2 and 3 of the medial patellofemoral retinaculum.  Passing loop was placed in this interval.  I then proceeded with a medial arthrotomy with a hemostat under the tissue in order to protect the patellofemoral joint.  This was carried with a standard medial patellofemoral approach.  At this time the patellofemoral joint was identified in the full-thickness chondral areas of the patella and the trochlea were identified.  2x63 K wires were then placed in the medial patella in order to evert this.  I also placed the proximal knee fiber tack anchors and converted one of the looped sutures to an internal brace type suture.  At this time the matrix associated chondrocyte implantation was performed of both the patella and the trochlea.  I started with the trochlea.  The regular guide was used around the defect.  The native cartilage was curetted.  The same exact guide and procedure was utilized on the patella.  The base of both of these defects was filled with fibrin glue.  Corresponding size graft was then taken from the membrane and this  was placed cell side down.  The remaining glue was used to form a ring around the implant.  These both had excellent apposition into the defects that were created.  Once the glue had been cured I turned my attention to the MPFL reconstruction.  The K  wires were removed from the patella.  The patella was everted back.  The gracilis graft was then looped through the 2 loops of the remaining fiber tack anchors on the patella.  These were tensioned and cut.  These were then passed in addition to the internal brace style sutures through the layer between 2 and 3.  The corresponding counterincision was created.  I then localized the shuttles point using a lateral x-ray with fluoroscopy.  A fiber loop was used around the 2 limbs of the gracilis graft to connect these.  Isometry was checked around the Of the medial anchor and these were placed into shuttles point using a 4.75 mm swivel lock with the knee in 40 degrees of flexion.  The knee was taken through range of motion with excellent patella stability without any residual lateral subluxation.  Arthrotomy was closed with 0 Vicryl and the remaining wounds were closed with 2-0 Vicryl and 3-0 nylon.  All counts were correct at the end of the case.  There were no complications.  Dressing with Xeroform gauze Webril and Ace was applied.  Hinged knee brace was applied   The patient awoke from anesthesia without difficulty and was transferred to PACU in stable condition.       POSTOPERATIVE PLAN: She will be weightbearing as tolerated on the left leg.  She will follow the MPFL reconstruction protocol.  She was placed on aspirin  for 2 weeks for blood clot prevention  Carmina Chris, MD 1:22 PM

## 2023-09-12 NOTE — Brief Op Note (Signed)
   Brief Op Note  Date of Surgery: 09/12/2023  Preoperative Diagnosis: LEFT KNEE CHONDRAL DEFECT  Postoperative Diagnosis: same  Procedure: Procedure(s): RECONSTRUCTION, LIGAMENT, MEDIAL PATELLOFEMORAL IMPLANTATION, MATRIX-INDUCED AUTOLOGOUS CHONDROCYTES  Implants: Implant Name Type Inv. Item Serial No. Manufacturer Lot No. LRB No. Used Action  ANCHOR DBL 2.6 SLF-PNCH FIBRTK - [id number] Anchor ANCHOR DBL 2.6 SLF-PNCH FIBRTK  [employer] [id number] Left 1 Implanted  ANCHOR DBL 2.6 SLF-PNCH FIBRTK - [id number] Anchor ANCHOR DBL 2.6 SLF-PNCH FIBRTK  [employer] [id number] Left 1 Implanted  IMPL SYS 2ND FX PEEK 4.75X19.1 - [id number] Miscellaneous IMPL SYS 2ND FX PEEK 4.75X19.1  [employer] [id number] Left 1 Implanted  TENDON GRACILIS FROZEN 230-320 - [id number] Bone Implant TENDON GRACILIS FROZEN 230-320 [id number] [facility] [id number] Left 1 Implanted  MACI  autologous chondrocytes     [id number] Left 1 Implanted    Surgeons: Surgeon(s): Wilhelmenia Harada, MD  Anesthesia: General    Estimated Blood Loss: See anesthesia record  Complications: None  Condition to PACU: Stable  Carmina Chris, MD 09/12/2023 1:22 PM

## 2023-09-12 NOTE — Anesthesia Procedure Notes (Signed)
 Anesthesia Regional Block: Adductor canal block   Pre-Anesthetic Checklist: , timeout performed,  Correct Patient, Correct Site, Correct Laterality,  Correct Procedure, Correct Position, site marked,  Risks and benefits discussed,  Surgical consent,  Pre-op evaluation,  At surgeon's request and post-op pain management  Laterality: Left  Prep: Maximum Sterile Barrier Precautions used, chloraprep       Needles:  Injection technique: Single-shot  Needle Type: Echogenic Stimulator Needle     Needle Length: 9cm  Needle Gauge: 22     Additional Needles:   Procedures:,,,, ultrasound used (permanent image in chart),,    Narrative:  Start time: 09/12/2023 10:25 AM End time: 09/12/2023 10:30 AM Injection made incrementally with aspirations every 5 mL.  Performed by: Personally  Anesthesiologist: Jacquelyne Matte, DO  Additional Notes: Monitors applied. No increased pain on injection. No increased resistance to injection. Injection made in 5cc increments. Good needle visualization. Patient tolerated procedure well.

## 2023-09-12 NOTE — Discharge Instructions (Addendum)
 Discharge Instructions    Attending Surgeon: Wilhelmenia Harada, MD Office Phone Number: [phone]   Diagnosis and Procedures:    Surgeries Performed: Left knee MPFL reconstruction with MACI  placement  Discharge Plan:    Diet: Resume usual diet. Begin with light or bland foods.  Drink plenty of fluids.  Activity:  Weight bearing as tolerted left leg. You are advised to go home directly from the hospital or surgical center. Restrict your activities.  GENERAL INSTRUCTIONS: 1.  Please apply ice to your wound to help with swelling and inflammation. This will improve your comfort and your overall recovery following surgery.     2. Please call Dr. Verline Glow office at [phone] with questions Monday-Friday during business hours. If no one answers, please leave a message and someone should get back to the patient within 24 hours. For emergencies please call 911 or proceed to the emergency room.   3. Patient to notify surgical team if experiences any of the following: Bowel/Bladder dysfunction, uncontrolled pain, nerve/muscle weakness, incision with increased drainage or redness, nausea/vomiting and Fever greater than 101.0 F.  Be alert for signs of infection including redness, streaking, odor, fever or chills. Be alert for excessive pain or bleeding and notify your surgeon immediately.  WOUND INSTRUCTIONS:   Leave your dressing, cast, or splint in place until your post operative visit.  Keep it clean and dry.  Always keep the incision clean and dry until the staples/sutures are removed. If there is no drainage from the incision you should keep it open to air. If there is drainage from the incision you must keep it covered at all times until the drainage stops  Do not soak in a bath tub, hot tub, pool, lake or other body of water until 21 days after your surgery and your incision is completely dry and healed.  If you have removable sutures (or staples) they must be removed 10-14  days (unless otherwise instructed) from the day of your surgery.     1)  Elevate the extremity as much as possible.  2)  Keep the dressing clean and dry.  3)  Please call us  if the dressing becomes wet or dirty.  4)  If you are experiencing worsening pain or worsening swelling, please call.     MEDICATIONS: Resume all previous home medications at the previous prescribed dose and frequency unless otherwise noted Start taking the  pain medications on an as-needed basis as prescribed  Please taper down pain medication over the next week following surgery.  Ideally you should not require a refill of any narcotic pain medication.  Take pain medication with food to minimize nausea. In addition to the prescribed pain medication, you may take over-the-counter pain relievers such as Tylenol .  Do NOT take additional tylenol  if your pain medication already has tylenol  in it.  Aspirin  325mg  daily per instructions on bottle. Narcotic policy: Per [facility] clinic policy, our goal is ensure optimal postoperative pain control with a multimodal pain management strategy. For all [facility] patients, our goal is to wean post-operative narcotic medications by 6 weeks post-operatively, and many times sooner. If this is not possible due to utilization of pain medication prior to surgery, your [facility] doctor will support your acute post-operative pain control for the first 6 weeks postoperatively, with a plan to transition you back to your primary pain team following that. Max Spain will work to ensure a Therapist, occupational.       FOLLOWUP INSTRUCTIONS: 1. Follow up at  the Physical Therapy Clinic 3-4 days following surgery. This appointment should be scheduled unless other arrangements have been made.The Physical Therapy scheduling number is [phone] if an appointment has not already been arranged.  2. Contact Dr. Verline Glow office during office hours at [phone] or the practice after hours line at  [phone] for non-emergencies. For medical emergencies call 911.   Discharge Location: Home      Last dose of Tylenol  given at 9:20am today Last dose of NSAIDs given at 1:15pm today  Post Anesthesia Home Care Instructions  Activity: Get plenty of rest for the remainder of the day. A responsible individual must stay with you for 24 hours following the procedure.  For the next 24 hours, DO NOT: -Drive a car -Advertising copywriter -Drink alcoholic beverages -Take any medication unless instructed by your physician -Make any legal decisions or sign important papers.  Meals: Start with liquid foods such as gelatin or soup. Progress to regular foods as tolerated. Avoid greasy, spicy, heavy foods. If nausea and/or vomiting occur, drink only clear liquids until the nausea and/or vomiting subsides. Call your physician if vomiting continues.  Special Instructions/Symptoms: Your throat may feel dry or sore from the anesthesia or the breathing tube placed in your throat during surgery. If this causes discomfort, gargle with warm salt water. The discomfort should disappear within 24 hours.  If you had a scopolamine  patch placed behind your ear for the management of post- operative nausea and/or vomiting:  1. The medication in the patch is effective for 72 hours, after which it should be removed.  Wrap patch in a tissue and discard in the trash. Wash hands thoroughly with soap and water. 2. You may remove the patch earlier than 72 hours if you experience unpleasant side effects which may include dry mouth, dizziness or visual disturbances. 3. Avoid touching the patch. Wash your hands with soap and water after contact with the patch.    Regional Anesthesia Blocks  1. You may not be able to move or feel the "blocked" extremity after a regional anesthetic block. This may last may last from 3-48 hours after placement, but it will go away. The length of time depends on the medication injected and  your individual response to the medication. As the nerves start to wake up, you may experience tingling as the movement and feeling returns to your extremity. If the numbness and inability to move your extremity has not gone away after 48 hours, please call your surgeon.   2. The extremity that is blocked will need to be protected until the numbness is gone and the strength has returned. Because you cannot feel it, you will need to take extra care to avoid injury. Because it may be weak, you may have difficulty moving it or using it. You may not know what position it is in without looking at it while the block is in effect.  3. For blocks in the legs and feet, returning to weight bearing and walking needs to be done carefully. You will need to wait until the numbness is entirely gone and the strength has returned. You should be able to move your leg and foot normally before you try and bear weight or walk. You will need someone to be with you when you first try to ensure you do not fall and possibly risk injury.  4. Bruising and tenderness at the needle site are common side effects and will resolve in a few days.  5. Persistent numbness or  new problems with movement should be communicated to the surgeon or the [facility] [phone] [facility] [phone]).

## 2023-09-12 NOTE — Interval H&P Note (Signed)
 History and Physical Interval Note:  09/12/2023 11:04 AM  Megan [name]  has presented today for surgery, with the diagnosis of LEFT KNEE CHONDRAL DEFECT.  The various methods of treatment have been discussed with the patient and family. After consideration of risks, benefits and other options for treatment, the patient has consented to  Procedure(s) with comments: RECONSTRUCTION, LIGAMENT, MEDIAL PATELLOFEMORAL (Left) - LEFT KNEE MEDIAL PATELLOFEMORAL LIGAMENT RECONSTRUCTION ANDMATRIX ASSOCIATED CHONDROCYTES IMPLANTATION (PATELLA AND FEMUR) IMPLANTATION, MATRIX-INDUCED AUTOLOGOUS CHONDROCYTES (Left) as a surgical intervention.  The patient's history has been reviewed, patient examined, no change in status, stable for surgery.  I have reviewed the patient's chart and labs.  Questions were answered to the patient's satisfaction.     Megan [name]

## 2023-09-13 ENCOUNTER — Encounter (HOSPITAL_BASED_OUTPATIENT_CLINIC_OR_DEPARTMENT_OTHER): Payer: Self-pay | Admitting: Orthopaedic Surgery

## 2023-09-16 ENCOUNTER — Ambulatory Visit (HOSPITAL_BASED_OUTPATIENT_CLINIC_OR_DEPARTMENT_OTHER): Attending: Orthopaedic Surgery | Admitting: Physical Therapy

## 2023-09-16 ENCOUNTER — Other Ambulatory Visit: Payer: Self-pay

## 2023-09-16 ENCOUNTER — Encounter (HOSPITAL_BASED_OUTPATIENT_CLINIC_OR_DEPARTMENT_OTHER): Payer: Self-pay | Admitting: Physical Therapy

## 2023-09-16 DIAGNOSIS — M6281 Muscle weakness (generalized): Secondary | ICD-10-CM | POA: Diagnosis present

## 2023-09-16 DIAGNOSIS — M25562 Pain in left knee: Secondary | ICD-10-CM | POA: Diagnosis present

## 2023-09-16 DIAGNOSIS — G8929 Other chronic pain: Secondary | ICD-10-CM | POA: Insufficient documentation

## 2023-09-16 DIAGNOSIS — R262 Difficulty in walking, not elsewhere classified: Secondary | ICD-10-CM | POA: Diagnosis present

## 2023-09-16 DIAGNOSIS — M25362 Other instability, left knee: Secondary | ICD-10-CM | POA: Insufficient documentation

## 2023-09-16 NOTE — Therapy (Signed)
 OUTPATIENT PHYSICAL THERAPY LOWER EXTREMITY EVALUATION   Patient Name: Megan [name] MRN: [id number] DOB:07-Jun-1970, 53 y.o., female Today's Date: 09/16/2023  END OF SESSION:  PT End of Session - 09/16/23 1004     Visit Number 1    Number of Visits 24    Date for PT Re-Evaluation 12/09/23    Authorization Type [employer]    PT Start Time 1004    PT Stop Time 1038    PT Time Calculation (min) 34 min    Activity Tolerance Patient tolerated treatment well    Behavior During Therapy WFL for tasks assessed/performed             Past Medical History:  Diagnosis Date   Gestational diabetes mellitus in childbirth    Hypertension    Past Surgical History:  Procedure Laterality Date   IMPLANTATION, MATRIX-INDUCED AUTOLOGOUS CHONDROCYTES Left 09/12/2023   Procedure: IMPLANTATION, MATRIX-INDUCED AUTOLOGOUS CHONDROCYTES;  Surgeon: Wilhelmenia Harada, MD;  Location: [facility];  Service: Orthopedics;  Laterality: Left;   KNEE ARTHROSCOPY WITH MACI  CARTILAGE HARVEST Left 07/24/2023   Procedure: ARTHROSCOPY, KNEE, WITH CARTILAGE PROCUREMENT FOR CULTURE;  Surgeon: Wilhelmenia Harada, MD;  Location: [facility];  Service: Orthopedics;  Laterality: Left;  LEFT KNEE ARTHROSCOPY WITH MACI  HARVEST   MEDIAL PATELLOFEMORAL LIGAMENT REPAIR Left 09/12/2023   Procedure: RECONSTRUCTION, LIGAMENT, MEDIAL PATELLOFEMORAL;  Surgeon: Wilhelmenia Harada, MD;  Location: [facility];  Service: Orthopedics;  Laterality: Left;  LEFT KNEE MEDIAL PATELLOFEMORAL LIGAMENT RECONSTRUCTION ANDMATRIX ASSOCIATED CHONDROCYTES IMPLANTATION (PATELLA AND FEMUR)   TONSILLECTOMY AND ADENOIDECTOMY     TYMPANOSTOMY TUBE PLACEMENT  1984   Patient Active Problem List   Diagnosis Date Noted   Patellar instability of left knee 07/24/2023   High serum copper  level 03/01/2023   Vitamin B12 deficiency 10/31/2022   Encounter for weight management 10/31/2022   Left lumbar radiculitis 07/13/2022   Right hand  pain 10/28/2021   Raynaud's disease, idiopathic 07/08/2019   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HCC) 03/06/2019   Near syncope 02/12/2019   Microalbuminuria due to type 2 diabetes mellitus (HCC) 02/12/2019   Guyon syndrome, 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   Cubital tunnel syndrome on 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   Flexor carpi ulnaris tenosynovitis 08/30/2018   Patellar subluxation, left, initial encounter 09/23/2015   Plantar fasciitis, bilateral 01/08/2014   Reaction, situational, acute, to stress 10/16/2013   Lumbar pars defect 10/15/2012   Idiopathic scoliosis 10/11/2012   Essential hypertension, benign 12/27/2011   Hyperlipidemia 12/27/2011   Diabetes type 2, controlled (HCC) 12/27/2011   POLYCYSTIC OVARIAN DISEASE 05/06/2008    PCP: Cydney Draft, MD  REFERRING PROVIDER: Wilhelmenia Harada, MD  REFERRING DIAG: [id number] (ICD-10-CM) - Patellar instability of left knee; MPFL and MACI  implant DOS 09/12/23  THERAPY DIAG:  Chronic pain of left knee  Muscle weakness (generalized)  Difficulty in walking, not elsewhere classified  Rationale for Evaluation and Treatment: Rehabilitation  ONSET DATE: DOS 09/12/23  SUBJECTIVE:   SUBJECTIVE STATEMENT: Patient states knee is doing alright. Not using crutches.   PERTINENT HISTORY: HTN, HLD, DM, idiopathic scoliosis PAIN:  Are you having pain? Yes: NPRS scale: 2/10 Pain location: L knee Pain description: aching, pulling Aggravating factors: weightbearing Relieving factors: rest  PRECAUTIONS: Knee  WEIGHT BEARING RESTRICTIONS: WBAT  FALLS:  Has patient fallen in last 6 months? No   OCCUPATION: Supervisor at [employer]  PLOF: Independent  PATIENT GOALS: to walk right and without pain  OBJECTIVE: (objective measures from initial evaluation unless otherwise dated)   PATIENT SURVEYS: LEFS 23/80  COGNITION: Overall cognitive status: Within functional limits for tasks assessed     SENSATION:WFL  EDEMA: gross edema L knee  POSTURE: No Significant postural  limitations  PALPATION: grossly TTP L knee  LOWER EXTREMITY ROM:  Active ROM Right eval Left eval  Hip flexion    Hip extension    Hip abduction    Hip adduction    Hip internal rotation    Hip external rotation    Knee flexion    Knee extension  0  Ankle dorsiflexion  53  Ankle plantarflexion    Ankle inversion    Ankle eversion     (Blank rows = not tested) *= pain/symptoms  LOWER EXTREMITY MMT: NT due to post op status; good quad set on L, not able to perform SLR  MMT Right eval Left eval  Hip flexion    Hip extension    Hip abduction    Hip adduction    Hip internal rotation    Hip external rotation    Knee flexion    Knee extension    Ankle dorsiflexion    Ankle plantarflexion    Ankle inversion    Ankle eversion     (Blank rows = not tested) *= pain/symptoms   GAIT: Distance walked: 50 feet Assistive device utilized: None Level of assistance: Modified independence Comments: LLE in brace, locked in extension   TODAY'S TREATMENT:                                                                                                                              DATE:  Eval, dressing change, HEP, Education    PATIENT EDUCATION:  Education details: Patient educated on exam findings, POC, scope of PT, HEP, relevant anatomy, and protocol/precautions. Person educated: Patient Education method: Explanation, Demonstration, and Handouts Education comprehension: verbalized understanding, returned demonstration, verbal cues required, and tactile cues required  HOME EXERCISE PROGRAM: Access Code: [email or web address] URL: [email or web address] Date: 09/16/2023 Prepared by: Debria Fang Stephanne Greeley  Exercises - Long Sitting Quad Set  - 5 x daily - 7 x weekly - 2 sets - 10 reps - 10 second hold - Supine Heel Slide with Strap  - 5 x daily - 7 x weekly - 10 reps - 10 second hold - Active Straight Leg Raise with Quad Set  - 5 x daily - 7 x weekly - 2 sets - 10 reps - Long  Sitting Calf Stretch with Strap  - 1 x daily - 7 x weekly - 3 reps - 20 second hold - Seated Ankle Pumps  - 5 x daily - 7 x weekly - 20 reps  ASSESSMENT:  CLINICAL IMPRESSION: Patient a 53 y.o. y.o. female who was seen today for physical therapy evaluation and treatment for s/p L MPFL and MACI  implant DOS 09/12/23. Patient presents with pain limited deficits in L knee strength, ROM, endurance, activity tolerance, and functional mobility with ADL. Patient is having to modify and restrict ADL as  indicated by outcome measure score as well as subjective information and objective measures which is affecting overall participation. Patient will benefit from skilled physical therapy in order to improve function and reduce impairment.  OBJECTIVE IMPAIRMENTS: Abnormal gait, decreased activity tolerance, decreased balance, decreased endurance, decreased mobility, difficulty walking, decreased ROM, decreased strength, impaired flexibility, improper body mechanics, and pain.   ACTIVITY LIMITATIONS: carrying, lifting, bending, standing, squatting, sleeping, stairs, transfers, bathing, hygiene/grooming, locomotion level, and caring for others  PARTICIPATION LIMITATIONS: meal prep, cleaning, laundry, shopping, community activity, occupation, and yard work  PERSONAL FACTORS: Fitness, Time since onset of injury/illness/exacerbation, and 3+ comorbidities: HTN, HLD, DM, idiopathic scoliosis are also affecting patient's functional outcome.   REHAB POTENTIAL: Good  CLINICAL DECISION MAKING: Stable/uncomplicated  EVALUATION COMPLEXITY: Low   GOALS: Goals reviewed with patient? Yes  SHORT TERM GOALS: Target date: 10/14/2023    Patient will be independent with HEP in order to improve functional outcomes. Baseline: Goal status: INITIAL  2.  Patient will report at least 25% improvement in symptoms for improved quality of life. Baseline: Goal status: INITIAL  3.  Patient will perform SLR without lag. Baseline:   Goal status: INITIAL    LONG TERM GOALS: Target date: 12/09/2023    Patient will report at least 75% improvement in symptoms for improved quality of life. Baseline:  Goal status: INITIAL  2.  Patient will improve LEFS score by at least 18 points in order to indicate improved tolerance to activity. Baseline:  Goal status: INITIAL  3.  Patient will be able to squat without compensation in order to demonstrate improved LE strength. Baseline:  Goal status: INITIAL  4.  Patient will demonstrate full left knee ROM for improved ability to squat and navigate stairs. Baseline:  Goal status: INITIAL  5.  Patient will be able to perform forward step down test without deviation in order to demonstrate improved LE strength and motor control.  Baseline:  Goal status: INITIAL  6.  Patient will demonstrate grade of 5/5 MMT grade in all tested musculature as evidence of improved strength to assist with stair ambulation and gait.   Baseline:  Goal status: INITIAL   PLAN:  PT FREQUENCY: 1-2x/week  PT DURATION: 12 weeks  PLANNED INTERVENTIONS: 97164- PT Re-evaluation, 97110-Therapeutic exercises, 97530- Therapeutic activity, 97112- Neuromuscular re-education, 97535- Self Care, 19147- Manual therapy, [phone]- Gait training, [phone]- Orthotic Fit/training, [phone]- Canalith repositioning, [id number]- Aquatic Therapy, [phone]- Splinting, Patient/Family education, Balance training, Stair training, Taping, Dry Needling, Joint mobilization, Joint manipulation, Spinal manipulation, Spinal mobilization, Scar mobilization, and DME instructions.  PLAN FOR NEXT SESSION: per op note follow the MPFL reconstruction protocol   Perfecto Bracket Brandalyn Harting, PT 09/16/2023, 10:45 AM

## 2023-09-22 ENCOUNTER — Encounter (HOSPITAL_BASED_OUTPATIENT_CLINIC_OR_DEPARTMENT_OTHER): Admitting: Physical Therapy

## 2023-09-22 ENCOUNTER — Ambulatory Visit (INDEPENDENT_AMBULATORY_CARE_PROVIDER_SITE_OTHER): Admitting: Orthopaedic Surgery

## 2023-09-22 DIAGNOSIS — M25362 Other instability, left knee: Secondary | ICD-10-CM

## 2023-09-22 NOTE — Progress Notes (Signed)
 Post Operative Evaluation    Procedure/Date of Surgery: Left knee Maci  placement with MPFL reconstruction 09/12/23  Interval History:   Presents today post above procedure overall doing extremely well.  She has been compliant with brace usage.  She has been utilizing 2 crutches.  Overall with some swelling and occasional popping but working well with physical therapy  PMH/PSH/Family History/Social History/Meds/Allergies:    Past Medical History:  Diagnosis Date   Gestational diabetes mellitus in childbirth    Hypertension    Past Surgical History:  Procedure Laterality Date   IMPLANTATION, MATRIX-INDUCED AUTOLOGOUS CHONDROCYTES Left 09/12/2023   Procedure: IMPLANTATION, MATRIX-INDUCED AUTOLOGOUS CHONDROCYTES;  Surgeon: Wilhelmenia Harada, MD;  Location: [facility];  Service: Orthopedics;  Laterality: Left;   KNEE ARTHROSCOPY WITH MACI  CARTILAGE HARVEST Left 07/24/2023   Procedure: ARTHROSCOPY, KNEE, WITH CARTILAGE PROCUREMENT FOR CULTURE;  Surgeon: Wilhelmenia Harada, MD;  Location: [facility];  Service: Orthopedics;  Laterality: Left;  LEFT KNEE ARTHROSCOPY WITH MACI  HARVEST   MEDIAL PATELLOFEMORAL LIGAMENT REPAIR Left 09/12/2023   Procedure: RECONSTRUCTION, LIGAMENT, MEDIAL PATELLOFEMORAL;  Surgeon: Wilhelmenia Harada, MD;  Location: [facility];  Service: Orthopedics;  Laterality: Left;  LEFT KNEE MEDIAL PATELLOFEMORAL LIGAMENT RECONSTRUCTION ANDMATRIX ASSOCIATED CHONDROCYTES IMPLANTATION (PATELLA AND FEMUR)   TONSILLECTOMY AND ADENOIDECTOMY     TYMPANOSTOMY TUBE PLACEMENT  1984   Social History   Socioeconomic History   Marital status: Single    Spouse name: Ciearra Leifer   Number of children: Not on file   Years of education: Not on file   Highest education level: Associate degree: academic program  Occupational History   Occupation: Asst Diplomatic Services operational officer    Comment: [employer]   Tobacco Use   Smoking  status: Never   Smokeless tobacco: Never  Vaping Use   Vaping status: Never Used  Substance and Sexual Activity   Alcohol use: No   Drug use: No   Sexual activity: Yes    Partners: Male  Other Topics Concern   Not on file  Social History Narrative   2 caffeine  drinks per day. No regular exercise. She works full-time and is going to school.   Social Drivers of Corporate investment banker Strain: Low Risk  (08/26/2023)   Overall Financial Resource Strain (CARDIA)    Difficulty of Paying Living Expenses: Not very hard  Food Insecurity: No Food Insecurity (08/26/2023)   Hunger Vital Sign    Worried About Running Out of Food in the Last Year: Never true    Ran Out of Food in the Last Year: Never true  Transportation Needs: No Transportation Needs (08/26/2023)   PRAPARE - Administrator, Civil Service (Medical): No    Lack of Transportation (Non-Medical): No  Physical Activity: Unknown (08/26/2023)   Exercise Vital Sign    Days of Exercise per Week: 0 days    Minutes of Exercise per Session: Not on file  Stress: No Stress Concern Present (08/26/2023)   [employer] of Occupational Health - Occupational Stress Questionnaire    Feeling of Stress : Only a little  Social Connections: Socially Isolated (08/26/2023)   Social Connection and Isolation Panel [NHANES]    Frequency of Communication with Friends and Family: Once a week    Frequency of Social Gatherings with Friends and Family: Once  a week    Attends Religious Services: Never    Active Member of Clubs or Organizations: No    Attends Engineer, structural: Not on file    Marital Status: Never married   Family History  Problem Relation Age of Onset   Hypertension Mother    Celiac disease Mother    Hypertension Father    Hypertension Sister    Hypertension Brother    Peripheral vascular disease Maternal Grandmother    Diabetes Paternal Aunt    Diabetes Maternal Uncle    Diabetes Paternal Aunt     Colon cancer Maternal Grandfather    Crohn's disease Cousin    Allergies  Allergen Reactions   Metformin  And Related Other (See Comments)    GI upset    Sertraline  Nausea Only    sweats   Current Outpatient Medications  Medication Sig Dispense Refill   AMBULATORY NON FORMULARY MEDICATION One touch ultra 2 glucose system  Use to test once daily as directed  Please schedule f/u appt 1 each 0   amLODipine  (NORVASC ) 5 MG tablet TAKE 1 TABLET BY MOUTH DAILY 90 tablet 3   aspirin  EC 325 MG tablet Take 1 tablet (325 mg total) by mouth daily. (Patient not taking: Reported on 08/30/2023) 14 tablet 0   atorvastatin  (LIPITOR) 40 MG tablet Take 1 tablet (40 mg total) by mouth daily. 90 tablet 3   losartan -hydrochlorothiazide  (HYZAAR) 100-12.5 MG tablet TAKE 1 TABLET BY MOUTH DAILY 90 tablet 3   norgestrel-ethinyl estradiol (LO/OVRAL,CRYSELLE) 0.3-30 MG-MCG tablet Take 1 tablet by mouth daily.     oxyCODONE  (ROXICODONE ) 5 MG immediate release tablet Take 1 tablet (5 mg total) by mouth every 4 (four) hours as needed for severe pain (pain score 7-10) or breakthrough pain. (Patient not taking: Reported on 08/30/2023) 15 tablet 0   Semaglutide , 2 MG/DOSE, (OZEMPIC , 2 MG/DOSE,) 8 MG/3ML SOPN Inject 2 mg (0.75 ml) into the skin once a week. 9 mL 2   No current facility-administered medications for this visit.   No results found.  Review of Systems:   A ROS was performed including pertinent positives and negatives as documented in the HPI.   Musculoskeletal Exam:    There were no vitals taken for this visit.  Left knee incisions are well-appearing without erythema or drainage.  There is no lateral laxity with testing of the patella in extension.  Range of motion is from 0 to 35 degrees.  Trace effusion  Imaging:      I personally reviewed and interpreted the radiographs.   Assessment:   2 weeks status post left knee MPFL reconstruction with Macy placement overall doing extremely well.  At  this time she will continue to advance her weightbearing.  I am okay with her discontinuing the brace initially while at her house for walking.  I will plan to see her back in 4 weeks for reassessment Plan :    - Return to clinic 4 weeks for reassessment       I personally saw and evaluated the patient, and participated in the management and treatment plan.  Wilhelmenia Harada, MD Attending Physician, Orthopedic Surgery  This document was dictated using Dragon voice recognition software. A reasonable attempt at proof reading has been made to minimize errors.

## 2023-09-29 ENCOUNTER — Ambulatory Visit (INDEPENDENT_AMBULATORY_CARE_PROVIDER_SITE_OTHER)

## 2023-09-29 ENCOUNTER — Ambulatory Visit (HOSPITAL_BASED_OUTPATIENT_CLINIC_OR_DEPARTMENT_OTHER): Admitting: Physical Therapy

## 2023-09-29 ENCOUNTER — Encounter (HOSPITAL_BASED_OUTPATIENT_CLINIC_OR_DEPARTMENT_OTHER): Payer: Self-pay | Admitting: Physical Therapy

## 2023-09-29 VITALS — BP 115/76 | HR 97 | Ht 64.0 in | Wt 190.0 lb

## 2023-09-29 DIAGNOSIS — E538 Deficiency of other specified B group vitamins: Secondary | ICD-10-CM

## 2023-09-29 DIAGNOSIS — M25562 Pain in left knee: Secondary | ICD-10-CM | POA: Diagnosis not present

## 2023-09-29 DIAGNOSIS — R262 Difficulty in walking, not elsewhere classified: Secondary | ICD-10-CM

## 2023-09-29 DIAGNOSIS — G8929 Other chronic pain: Secondary | ICD-10-CM

## 2023-09-29 DIAGNOSIS — M6281 Muscle weakness (generalized): Secondary | ICD-10-CM

## 2023-09-29 MED ORDER — CYANOCOBALAMIN 1000 MCG/ML IJ SOLN
1000.0000 ug | Freq: Once | INTRAMUSCULAR | Status: AC
  Administered 2023-09-29: 1000 ug via INTRAMUSCULAR

## 2023-09-29 NOTE — Therapy (Signed)
 OUTPATIENT PHYSICAL THERAPY LOWER EXTREMITY EVALUATION   Patient Name: Megan [name] MRN: [id number] DOB:1971-01-31, 53 y.o., female Today's Date: 09/29/2023  END OF SESSION:  PT End of Session - 09/29/23 0826     Visit Number 2    Number of Visits 24    Date for PT Re-Evaluation 12/09/23    Authorization Type [employer]    PT Start Time 0801    PT Stop Time 0839    PT Time Calculation (min) 38 min    Activity Tolerance Patient tolerated treatment well    Behavior During Therapy [facility] for tasks assessed/performed              Past Medical History:  Diagnosis Date   Gestational diabetes mellitus in childbirth    Hypertension    Past Surgical History:  Procedure Laterality Date   IMPLANTATION, MATRIX-INDUCED AUTOLOGOUS CHONDROCYTES Left 09/12/2023   Procedure: IMPLANTATION, MATRIX-INDUCED AUTOLOGOUS CHONDROCYTES;  Surgeon: Wilhelmenia Harada, MD;  Location: [facility];  Service: Orthopedics;  Laterality: Left;   KNEE ARTHROSCOPY WITH MACI  CARTILAGE HARVEST Left 07/24/2023   Procedure: ARTHROSCOPY, KNEE, WITH CARTILAGE PROCUREMENT FOR CULTURE;  Surgeon: Wilhelmenia Harada, MD;  Location: [facility];  Service: Orthopedics;  Laterality: Left;  LEFT KNEE ARTHROSCOPY WITH MACI  HARVEST   MEDIAL PATELLOFEMORAL LIGAMENT REPAIR Left 09/12/2023   Procedure: RECONSTRUCTION, LIGAMENT, MEDIAL PATELLOFEMORAL;  Surgeon: Wilhelmenia Harada, MD;  Location: [facility];  Service: Orthopedics;  Laterality: Left;  LEFT KNEE MEDIAL PATELLOFEMORAL LIGAMENT RECONSTRUCTION ANDMATRIX ASSOCIATED CHONDROCYTES IMPLANTATION (PATELLA AND FEMUR)   TONSILLECTOMY AND ADENOIDECTOMY     TYMPANOSTOMY TUBE PLACEMENT  1984   Patient Active Problem List   Diagnosis Date Noted   Patellar instability of left knee 07/24/2023   High serum copper  level 03/01/2023   Vitamin B12 deficiency 10/31/2022   Encounter for weight management 10/31/2022   Left lumbar radiculitis 07/13/2022   Right  hand pain 10/28/2021   Raynaud's disease, idiopathic 07/08/2019   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HCC) 03/06/2019   Near syncope 02/12/2019   Microalbuminuria due to type 2 diabetes mellitus (HCC) 02/12/2019   Guyon syndrome, 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   Cubital tunnel syndrome on 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   Flexor carpi ulnaris tenosynovitis 08/30/2018   Patellar subluxation, left, initial encounter 09/23/2015   Plantar fasciitis, bilateral 01/08/2014   Reaction, situational, acute, to stress 10/16/2013   Lumbar pars defect 10/15/2012   Idiopathic scoliosis 10/11/2012   Essential hypertension, benign 12/27/2011   Hyperlipidemia 12/27/2011   Diabetes type 2, controlled (HCC) 12/27/2011   POLYCYSTIC OVARIAN DISEASE 05/06/2008    PCP: Cydney Draft, MD  REFERRING PROVIDER: Wilhelmenia Harada, MD  REFERRING DIAG: [id number] (ICD-10-CM) - Patellar instability of left knee; MPFL and MACI  implant DOS 09/12/23  THERAPY DIAG:  Chronic pain of left knee  Muscle weakness (generalized)  Difficulty in walking, not elsewhere classified  Rationale for Evaluation and Treatment: Rehabilitation  ONSET DATE: DOS 09/12/23  SUBJECTIVE:   SUBJECTIVE STATEMENT: Pt states that the swelling is better. Pt feels flexion is harder than extension. Will swell after exercise.   PERTINENT HISTORY: HTN, HLD, DM, idiopathic scoliosis PAIN:  Are you having pain? Yes: NPRS scale: 4/10 Pain location: L knee Pain description: aching, pulling Aggravating factors: weightbearing Relieving factors: rest  PRECAUTIONS: Knee  WEIGHT BEARING RESTRICTIONS: WBAT  FALLS:  Has patient fallen in last 6 months? No   OCCUPATION: Supervisor at [employer]  PLOF: Independent  PATIENT GOALS: to walk right and without pain  OBJECTIVE: (objective measures from initial  evaluation unless otherwise dated)   PATIENT SURVEYS: LEFS 23/80  COGNITION: Overall cognitive status: Within functional limits for tasks assessed     SENSATION:WFL  EDEMA:  gross edema L knee  POSTURE: No Significant postural limitations  PALPATION: grossly TTP L knee  LOWER EXTREMITY ROM:  Active ROM Right eval Left eval  Hip flexion    Hip extension    Hip abduction    Hip adduction    Hip internal rotation    Hip external rotation    Knee flexion    Knee extension  0  Ankle dorsiflexion  53  Ankle plantarflexion    Ankle inversion    Ankle eversion     (Blank rows = not tested) *= pain/symptoms  LOWER EXTREMITY MMT: NT due to post op status; good quad set on L, not able to perform SLR  MMT Right eval Left eval  Hip flexion    Hip extension    Hip abduction    Hip adduction    Hip internal rotation    Hip external rotation    Knee flexion    Knee extension    Ankle dorsiflexion    Ankle plantarflexion    Ankle inversion    Ankle eversion     (Blank rows = not tested) *= pain/symptoms   GAIT: Distance walked: 50 feet Assistive device utilized: None Level of assistance: Modified independence Comments: LLE in brace, locked in extension   TODAY'S TREATMENT:                                                                                                                              DATE:   5/23  Patellar mobs inf and sup grade III  PROM flexion to tolerance (55 deg); ext to -3 Calf stretch with quad set 2x10 3s SLR 10x with quad set focus (no extensor lag)  Taiglate stretch 5x 10x ( up to 71 deg)   Gait: single crutch to reduce antalgic pattern; walking safety  HEP updates, ROM and quad firing focus    Eval:  Eval, dressing change, HEP, Education    PATIENT EDUCATION:  Education details: surgical precautions and goals, anatomy, exercise progression, DOMS expectations, muscle firing,  envelope of function, HEP, POC  Person educated: Patient Education method: Explanation, Demonstration, and Handouts Education comprehension: verbalized understanding, returned demonstration, verbal cues required, and tactile cues  required  HOME EXERCISE PROGRAM: Access Code: [email or web address] URL: [email or web address] Date: 09/16/2023 Prepared by: Debria Fang Zaunegger  Exercises - Long Sitting Quad Set  - 5 x daily - 7 x weekly - 2 sets - 10 reps - 10 second hold - Supine Heel Slide with Strap  - 5 x daily - 7 x weekly - 10 reps - 10 second hold - Active Straight Leg Raise with Quad Set  - 5 x daily - 7 x weekly - 2 sets - 10 reps - Long Sitting Calf Stretch with Strap  - 1 x daily -  7 x weekly - 3 reps - 20 second hold - Seated Ankle Pumps  - 5 x daily - 7 x weekly - 20 reps  ASSESSMENT:  CLINICAL IMPRESSION: Pt is 2.5  wks post op at this time. Pt with good progression of ROM of flexion to 71 deg and ext to 2 deg of hyper at end of session. Pt does have antalgic gait without AD but improves with proper quad firing, sequencing cuing, and proper AD sizing. Pt able to demo 10 SLR without extensor lag and able perform all exercise without pain. Plan to continue per MPFL protocol. AD advised at this time to improve gait quality and WB tolerance. Patient will benefit from skilled physical therapy in order to improve function and reduce impairment.  OBJECTIVE IMPAIRMENTS: Abnormal gait, decreased activity tolerance, decreased balance, decreased endurance, decreased mobility, difficulty walking, decreased ROM, decreased strength, impaired flexibility, improper body mechanics, and pain.   ACTIVITY LIMITATIONS: carrying, lifting, bending, standing, squatting, sleeping, stairs, transfers, bathing, hygiene/grooming, locomotion level, and caring for others  PARTICIPATION LIMITATIONS: meal prep, cleaning, laundry, shopping, community activity, occupation, and yard work  PERSONAL FACTORS: Fitness, Time since onset of injury/illness/exacerbation, and 3+ comorbidities: HTN, HLD, DM, idiopathic scoliosis are also affecting patient's functional outcome.   REHAB POTENTIAL: Good  CLINICAL DECISION MAKING:  Stable/uncomplicated  EVALUATION COMPLEXITY: Low   GOALS: Goals reviewed with patient? Yes  SHORT TERM GOALS: Target date: 10/14/2023    Patient will be independent with HEP in order to improve functional outcomes. Baseline: Goal status: INITIAL  2.  Patient will report at least 25% improvement in symptoms for improved quality of life. Baseline: Goal status: INITIAL  3.  Patient will perform SLR without lag. Baseline:  Goal status: INITIAL    LONG TERM GOALS: Target date: 12/09/2023    Patient will report at least 75% improvement in symptoms for improved quality of life. Baseline:  Goal status: INITIAL  2.  Patient will improve LEFS score by at least 18 points in order to indicate improved tolerance to activity. Baseline:  Goal status: INITIAL  3.  Patient will be able to squat without compensation in order to demonstrate improved LE strength. Baseline:  Goal status: INITIAL  4.  Patient will demonstrate full left knee ROM for improved ability to squat and navigate stairs. Baseline:  Goal status: INITIAL  5.  Patient will be able to perform forward step down test without deviation in order to demonstrate improved LE strength and motor control.  Baseline:  Goal status: INITIAL  6.  Patient will demonstrate grade of 5/5 MMT grade in all tested musculature as evidence of improved strength to assist with stair ambulation and gait.   Baseline:  Goal status: INITIAL   PLAN:  PT FREQUENCY: 1-2x/week  PT DURATION: 12 weeks  PLANNED INTERVENTIONS: 97164- PT Re-evaluation, 97110-Therapeutic exercises, 97530- Therapeutic activity, 97112- Neuromuscular re-education, 97535- Self Care, 16109- Manual therapy, [phone]- Gait training, [phone]- Orthotic Fit/training, [phone]- Canalith repositioning, [id number]- Aquatic Therapy, [phone]- Splinting, Patient/Family education, Balance training, Stair training, Taping, Dry Needling, Joint mobilization, Joint manipulation, Spinal manipulation,  Spinal mobilization, Scar mobilization, and DME instructions.  PLAN FOR NEXT SESSION: per op note follow the MPFL reconstruction protocol   Silver Dross, PT 09/29/2023, 8:46 AM

## 2023-09-29 NOTE — Progress Notes (Unsigned)
Patient is here for a vitamin B12 injection. Denies GI problems or dizziness.   Patient tolerated injection well without complications. Pt is advised to schedule next injection in 30 days.  Location:  [facility] 

## 2023-10-06 ENCOUNTER — Encounter (HOSPITAL_BASED_OUTPATIENT_CLINIC_OR_DEPARTMENT_OTHER)

## 2023-10-10 ENCOUNTER — Encounter (HOSPITAL_BASED_OUTPATIENT_CLINIC_OR_DEPARTMENT_OTHER): Payer: Self-pay | Admitting: Physical Therapy

## 2023-10-10 ENCOUNTER — Ambulatory Visit (HOSPITAL_BASED_OUTPATIENT_CLINIC_OR_DEPARTMENT_OTHER): Attending: Orthopaedic Surgery | Admitting: Physical Therapy

## 2023-10-10 DIAGNOSIS — R262 Difficulty in walking, not elsewhere classified: Secondary | ICD-10-CM | POA: Insufficient documentation

## 2023-10-10 DIAGNOSIS — M25562 Pain in left knee: Secondary | ICD-10-CM | POA: Insufficient documentation

## 2023-10-10 DIAGNOSIS — G8929 Other chronic pain: Secondary | ICD-10-CM | POA: Diagnosis present

## 2023-10-10 DIAGNOSIS — M6281 Muscle weakness (generalized): Secondary | ICD-10-CM | POA: Insufficient documentation

## 2023-10-10 NOTE — Therapy (Signed)
 OUTPATIENT PHYSICAL THERAPY LOWER EXTREMITY EVALUATION   Patient Name: Megan [name] MRN: [id number] DOB:04-08-1971, 53 y.o., female Today's Date: 10/10/2023  END OF SESSION:  PT End of Session - 10/10/23 0811     Visit Number 3    Number of Visits 24    Date for PT Re-Evaluation 12/09/23    Authorization Type [employer]    PT Start Time 0803    PT Stop Time 0845    PT Time Calculation (min) 42 min    Activity Tolerance Patient tolerated treatment well    Behavior During Therapy [facility] for tasks assessed/performed               Past Medical History:  Diagnosis Date   Gestational diabetes mellitus in childbirth    Hypertension    Past Surgical History:  Procedure Laterality Date   IMPLANTATION, MATRIX-INDUCED AUTOLOGOUS CHONDROCYTES Left 09/12/2023   Procedure: IMPLANTATION, MATRIX-INDUCED AUTOLOGOUS CHONDROCYTES;  Surgeon: Wilhelmenia Harada, MD;  Location: [facility];  Service: Orthopedics;  Laterality: Left;   KNEE ARTHROSCOPY WITH MACI  CARTILAGE HARVEST Left 07/24/2023   Procedure: ARTHROSCOPY, KNEE, WITH CARTILAGE PROCUREMENT FOR CULTURE;  Surgeon: Wilhelmenia Harada, MD;  Location: [facility];  Service: Orthopedics;  Laterality: Left;  LEFT KNEE ARTHROSCOPY WITH MACI  HARVEST   MEDIAL PATELLOFEMORAL LIGAMENT REPAIR Left 09/12/2023   Procedure: RECONSTRUCTION, LIGAMENT, MEDIAL PATELLOFEMORAL;  Surgeon: Wilhelmenia Harada, MD;  Location: [facility];  Service: Orthopedics;  Laterality: Left;  LEFT KNEE MEDIAL PATELLOFEMORAL LIGAMENT RECONSTRUCTION ANDMATRIX ASSOCIATED CHONDROCYTES IMPLANTATION (PATELLA AND FEMUR)   TONSILLECTOMY AND ADENOIDECTOMY     TYMPANOSTOMY TUBE PLACEMENT  1984   Patient Active Problem List   Diagnosis Date Noted   Patellar instability of left knee 07/24/2023   High serum copper  level 03/01/2023   Vitamin B12 deficiency 10/31/2022   Encounter for weight management 10/31/2022   Left lumbar radiculitis 07/13/2022   Right  hand pain 10/28/2021   Raynaud's disease, idiopathic 07/08/2019   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HCC) 03/06/2019   Near syncope 02/12/2019   Microalbuminuria due to type 2 diabetes mellitus (HCC) 02/12/2019   Guyon syndrome, 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   Cubital tunnel syndrome on 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   Flexor carpi ulnaris tenosynovitis 08/30/2018   Patellar subluxation, left, initial encounter 09/23/2015   Plantar fasciitis, bilateral 01/08/2014   Reaction, situational, acute, to stress 10/16/2013   Lumbar pars defect 10/15/2012   Idiopathic scoliosis 10/11/2012   Essential hypertension, benign 12/27/2011   Hyperlipidemia 12/27/2011   Diabetes type 2, controlled (HCC) 12/27/2011   POLYCYSTIC OVARIAN DISEASE 05/06/2008    PCP: Cydney Draft, MD  REFERRING PROVIDER: Wilhelmenia Harada, MD  REFERRING DIAG: [phone] (ICD-10-CM) - Patellar instability of left knee; MPFL and MACI  implant DOS 09/12/23  THERAPY DIAG:  Chronic pain of left knee  Muscle weakness (generalized)  Difficulty in walking, not elsewhere classified  Rationale for Evaluation and Treatment: Rehabilitation  ONSET DATE: DOS 09/12/23  SUBJECTIVE:   SUBJECTIVE STATEMENT: Pt states the knee is just sore. The knee pain is down. 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 Has been propping it while at work.   PERTINENT HISTORY: HTN, HLD, DM, idiopathic scoliosis PAIN:  Are you having pain? No: NPRS scale: 0/10 Pain location: L knee Pain description: aching, pulling Aggravating factors: weightbearing Relieving factors: rest  PRECAUTIONS: Knee  WEIGHT BEARING RESTRICTIONS: WBAT  FALLS:  Has patient fallen in last 6 months? No   OCCUPATION: Supervisor at [employer]  PLOF:  Independent  PATIENT GOALS: to walk right and without pain  OBJECTIVE: (objective measures from initial evaluation unless otherwise dated)   PATIENT SURVEYS: LEFS 23/80  COGNITION: Overall cognitive status: Within  functional limits for tasks assessed     SENSATION:WFL  EDEMA: gross edema L knee  POSTURE: No Significant postural limitations  PALPATION: grossly TTP L knee  LOWER EXTREMITY ROM:  Active ROM Right eval Left eval  Hip flexion    Hip extension    Hip abduction    Hip adduction    Hip internal rotation    Hip external rotation    Knee flexion    Knee extension  0  Ankle dorsiflexion  53  Ankle plantarflexion    Ankle inversion    Ankle eversion     (Blank rows = not tested) *= pain/symptoms  LOWER EXTREMITY MMT: NT due to post op status; good quad set on L, not able to perform SLR  MMT Right eval Left eval  Hip flexion    Hip extension    Hip abduction    Hip adduction    Hip internal rotation    Hip external rotation    Knee flexion    Knee extension    Ankle dorsiflexion    Ankle plantarflexion    Ankle inversion    Ankle eversion     (Blank rows = not tested) *= pain/symptoms   GAIT: Distance walked: 50 feet Assistive device utilized: None Level of assistance: Modified independence Comments: LLE in brace, locked in extension   TODAY'S TREATMENT:                                                                                                                              DATE:   6/3  Recumbent bike half revs 6.5 min Flexion 75; ext  Propped quad set with 2.5 weight overpressure 3x10 2s  SAQ 2.5lbs 3x10 LAQ 2x10 Knee ext isometric 5s 2x10 SLS 30s 4x  Tall table STS 2x10   5/23  Patellar mobs inf and sup grade III  PROM flexion to tolerance (55 deg); ext to -3 Calf stretch with quad set 2x10 3s SLR 10x with quad set focus (no extensor lag)  Taiglate stretch 5x 10x ( up to 71 deg)   Gait: single crutch to reduce antalgic pattern; walking safety  HEP updates, ROM and quad firing focus    Eval:  Eval, dressing change, HEP, Education    PATIENT EDUCATION:  Education details: surgical precautions and goals, anatomy, exercise  progression, DOMS expectations, muscle firing,  envelope of function, HEP, POC  Person educated: Patient Education method: Explanation, Demonstration, and Handouts Education comprehension: verbalized understanding, returned demonstration, verbal cues required, and tactile cues required  HOME EXERCISE PROGRAM: Access Code: [email or web address] URL: [email or web address] Date: 09/16/2023 Prepared by: Debria Fang Zaunegger  Exercises - Long Sitting Quad Set  - 5 x daily - 7 x weekly - 2 sets - 10 reps - 10 second  hold - Supine Heel Slide with Strap  - 5 x daily - 7 x weekly - 10 reps - 10 second hold - Active Straight Leg Raise with Quad Set  - 5 x daily - 7 x weekly - 2 sets - 10 reps - Long Sitting Calf Stretch with Strap  - 1 x daily - 7 x weekly - 3 reps - 20 second hold - Seated Ankle Pumps  - 5 x daily - 7 x weekly - 20 reps  ASSESSMENT:  CLINICAL IMPRESSION: Pt is 4  wks post op at this time. Pt HEP progressed as per protocol and able to start partial ROM CKC exercise. No pain noted during session. Pt progressing well with flexion ROM to 90 and ext to 0 today. HEP updated accordingly with protocol. Consider 2 aquatic sessions as pt has access to pool at home for advancing early WB exercise. Patient will benefit from skilled physical therapy in order to improve function and reduce impairment.  OBJECTIVE IMPAIRMENTS: Abnormal gait, decreased activity tolerance, decreased balance, decreased endurance, decreased mobility, difficulty walking, decreased ROM, decreased strength, impaired flexibility, improper body mechanics, and pain.   ACTIVITY LIMITATIONS: carrying, lifting, bending, standing, squatting, sleeping, stairs, transfers, bathing, hygiene/grooming, locomotion level, and caring for others  PARTICIPATION LIMITATIONS: meal prep, cleaning, laundry, shopping, community activity, occupation, and yard work  PERSONAL FACTORS: Fitness, Time since onset of injury/illness/exacerbation, and  3+ comorbidities: HTN, HLD, DM, idiopathic scoliosis are also affecting patient's functional outcome.   REHAB POTENTIAL: Good  CLINICAL DECISION MAKING: Stable/uncomplicated  EVALUATION COMPLEXITY: Low   GOALS: Goals reviewed with patient? Yes  SHORT TERM GOALS: Target date: 10/14/2023    Patient will be independent with HEP in order to improve functional outcomes. Baseline: Goal status: INITIAL  2.  Patient will report at least 25% improvement in symptoms for improved quality of life. Baseline: Goal status: INITIAL  3.  Patient will perform SLR without lag. Baseline:  Goal status: INITIAL    LONG TERM GOALS: Target date: 12/09/2023    Patient will report at least 75% improvement in symptoms for improved quality of life. Baseline:  Goal status: INITIAL  2.  Patient will improve LEFS score by at least 18 points in order to indicate improved tolerance to activity. Baseline:  Goal status: INITIAL  3.  Patient will be able to squat without compensation in order to demonstrate improved LE strength. Baseline:  Goal status: INITIAL  4.  Patient will demonstrate full left knee ROM for improved ability to squat and navigate stairs. Baseline:  Goal status: INITIAL  5.  Patient will be able to perform forward step down test without deviation in order to demonstrate improved LE strength and motor control.  Baseline:  Goal status: INITIAL  6.  Patient will demonstrate grade of 5/5 MMT grade in all tested musculature as evidence of improved strength to assist with stair ambulation and gait.   Baseline:  Goal status: INITIAL   PLAN:  PT FREQUENCY: 1-2x/week  PT DURATION: 12 weeks  PLANNED INTERVENTIONS: 97164- PT Re-evaluation, 97110-Therapeutic exercises, 97530- Therapeutic activity, 97112- Neuromuscular re-education, 97535- Self Care, 16109- Manual therapy, [phone]- Gait training, [phone]- Orthotic Fit/training, [phone]- Canalith repositioning, [id number]- Aquatic Therapy, [phone]-  Splinting, Patient/Family education, Balance training, Stair training, Taping, Dry Needling, Joint mobilization, Joint manipulation, Spinal manipulation, Spinal mobilization, Scar mobilization, and DME instructions.  PLAN FOR NEXT SESSION: per op note follow the MPFL reconstruction protocol   Silver Dross, PT 10/10/2023, 8:47 AM

## 2023-10-13 ENCOUNTER — Ambulatory Visit (HOSPITAL_BASED_OUTPATIENT_CLINIC_OR_DEPARTMENT_OTHER): Admitting: Physical Therapy

## 2023-10-13 ENCOUNTER — Encounter (HOSPITAL_BASED_OUTPATIENT_CLINIC_OR_DEPARTMENT_OTHER): Payer: Self-pay | Admitting: Physical Therapy

## 2023-10-13 DIAGNOSIS — G8929 Other chronic pain: Secondary | ICD-10-CM

## 2023-10-13 DIAGNOSIS — M25562 Pain in left knee: Secondary | ICD-10-CM | POA: Diagnosis not present

## 2023-10-13 DIAGNOSIS — M6281 Muscle weakness (generalized): Secondary | ICD-10-CM

## 2023-10-13 DIAGNOSIS — R262 Difficulty in walking, not elsewhere classified: Secondary | ICD-10-CM

## 2023-10-13 NOTE — Therapy (Signed)
 OUTPATIENT PHYSICAL THERAPY LOWER EXTREMITY EVALUATION   Patient Name: Megan [name] MRN: [id number] DOB:[birth year], 53 y.o., female Today's Date: 10/13/2023  END OF SESSION:  PT End of Session - 10/13/23 0806     Visit Number 4    Number of Visits 24    Date for PT Re-Evaluation 12/09/23    Authorization Type [employer]et[employer]    PT Start Time 0804    PT Stop Time 0843    PT Time Calculation (min) 39 min    Activity Tolerance Patient tolerated treatment well    Behavior During Therapy [facility] for tasks assessed/performed                Past Medical History:  Diagnosis Date   Gestational diabetes mellitus in childbirth    Hypertension    Past Surgical History:  Procedure Laterality Date   IMPLANTATION, MATRIX-INDUCED AUTOLOGOUS CHONDROCYTES Left 09/12/2023   Procedure: IMPLANTATION, MATRIX-INDUCED AUTOLOGOUS CHONDROCYTES;  Surgeon: Wilhelmenia Harada, MD;  Location: [facility];  Service: Orthopedics;  Laterality: Left;   KNEE ARTHROSCOPY WITH MACI  CARTILAGE HARVEST Left 07/24/2023   Procedure: ARTHROSCOPY, KNEE, WITH CARTILAGE PROCUREMENT FOR CULTURE;  Surgeon: Wilhelmenia Harada, MD;  Location: [facility];  Service: Orthopedics;  Laterality: Left;  LEFT KNEE ARTHROSCOPY WITH MACI  HARVEST   MEDIAL PATELLOFEMORAL LIGAMENT REPAIR Left 09/12/2023   Procedure: RECONSTRUCTION, LIGAMENT, MEDIAL PATELLOFEMORAL;  Surgeon: Wilhelmenia Harada, MD;  Location: [facility];  Service: Orthopedics;  Laterality: Left;  LEFT KNEE MEDIAL PATELLOFEMORAL LIGAMENT RECONSTRUCTION ANDMATRIX ASSOCIATED CHONDROCYTES IMPLANTATION (PATELLA AND FEMUR)   TONSILLECTOMY AND ADENOIDECTOMY     TYMPANOSTOMY TUBE PLACEMENT  1984   Patient Active Problem List   Diagnosis Date Noted   Patellar instability of left knee 07/24/2023   High serum copper  level 03/01/2023   Vitamin B12 deficiency 10/31/2022   Encounter for weight management 10/31/2022   Left lumbar radiculitis 07/13/2022   Right  hand pain 10/28/2021   Raynaud's disease, idiopathic 07/08/2019   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HCC) 03/06/2019   Near syncope 02/12/2019   Microalbuminuria due to type 2 diabetes mellitus (HCC) 02/12/2019   Guyon syndrome, 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   Cubital tunnel syndrome on 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   Flexor carpi ulnaris tenosynovitis 08/30/2018   Patellar subluxation, left, initial encounter 09/23/2015   Plantar fasciitis, bilateral 01/08/2014   Reaction, situational, acute, to stress 10/16/2013   Lumbar pars defect 10/15/2012   Idiopathic scoliosis 10/11/2012   Essential hypertension, benign 12/27/2011   Hyperlipidemia 12/27/2011   Diabetes type 2, controlled (HCC) 12/27/2011   POLYCYSTIC OVARIAN DISEASE 05/06/2008    PCP: Cydney Draft, MD  REFERRING PROVIDER: Wilhelmenia Harada, MD  REFERRING DIAG: [phone] (ICD-10-CM) - Patellar instability of left knee; MPFL and MACI  implant DOS 09/12/23  THERAPY DIAG:  Chronic pain of left knee  Muscle weakness (generalized)  Difficulty in walking, not elsewhere classified  Rationale for Evaluation and Treatment: Rehabilitation  ONSET DATE: DOS 09/12/23  SUBJECTIVE:   SUBJECTIVE STATEMENT: Pt reports muscle soreness since last session but no other issues.   PERTINENT HISTORY: HTN, HLD, DM, idiopathic scoliosis PAIN:  Are you having pain? No: NPRS scale: 0/10 Pain location: L knee Pain description: aching, pulling Aggravating factors: weightbearing Relieving factors: rest  PRECAUTIONS: Knee  WEIGHT BEARING RESTRICTIONS: WBAT  FALLS:  Has patient fallen in last 6 months? No   OCCUPATION: Supervisor at [employer]  PLOF: Independent  PATIENT GOALS: to walk right and without pain  OBJECTIVE: (objective measures from initial evaluation unless otherwise dated)  PATIENT SURVEYS: LEFS 23/80  COGNITION: Overall cognitive status: Within functional limits for tasks assessed     SENSATION:WFL  EDEMA: gross edema L knee  POSTURE: No Significant  postural limitations  PALPATION: grossly TTP L knee  LOWER EXTREMITY ROM:  Active ROM Right eval Left eval  Hip flexion    Hip extension    Hip abduction    Hip adduction    Hip internal rotation    Hip external rotation    Knee flexion    Knee extension  0  Ankle dorsiflexion  53  Ankle plantarflexion    Ankle inversion    Ankle eversion     (Blank rows = not tested) *= pain/symptoms  LOWER EXTREMITY MMT: NT due to post op status; good quad set on L, not able to perform SLR  MMT Right eval Left eval  Hip flexion    Hip extension    Hip abduction    Hip adduction    Hip internal rotation    Hip external rotation    Knee flexion    Knee extension    Ankle dorsiflexion    Ankle plantarflexion    Ankle inversion    Ankle eversion     (Blank rows = not tested) *= pain/symptoms   GAIT: Distance walked: 50 feet Assistive device utilized: None Level of assistance: Modified independence Comments: LLE in brace, locked in extension   TODAY'S TREATMENT:                                                                                                                              DATE:   6/6  Recumbent bike half revs 7 min Flexion to 90 Propped quad set with 2.5 weight overpressure 3x10 2s  Wall slide flexion 10s 15x LAQ 2lbs 3x12 Bridge 4x8  Patellar mobs grade III in all 4 directions  Skin rolling and xfriction scar massage 6/3  Recumbent bike half revs 6.5 min Flexion 75; ext  Propped quad set with 2.5 weight overpressure 3x10 2s  SAQ 2.5lbs 3x10 LAQ 2x10 Knee ext isometric 5s 2x10 SLS 30s 4x  Tall table STS 2x10   5/23  Patellar mobs inf and sup grade III  PROM flexion to tolerance (55 deg); ext to -3 Calf stretch with quad set 2x10 3s SLR 10x with quad set focus (no extensor lag)  Taiglate stretch 5x 10x ( up to 71 deg)   Gait: single crutch to reduce antalgic pattern; walking safety  HEP updates, ROM and quad firing focus    Eval:   Eval, dressing change, HEP, Education    PATIENT EDUCATION:  Education details: surgical precautions and goals, anatomy, exercise progression, DOMS expectations, muscle firing,  envelope of function, HEP, POC  Person educated: Patient Education method: Explanation, Demonstration, and Handouts Education comprehension: verbalized understanding, returned demonstration, verbal cues required, and tactile cues required  HOME EXERCISE PROGRAM: Access Code: [email or web address] (electronic)  URL: [email or web address] Date: 09/16/2023 Prepared by:  Debria Fang Zaunegger    ASSESSMENT:  CLINICAL IMPRESSION: Pt is 4  wks post op at this time. Pt ROM improves to 90 deg flexion and 0 deg ext today. Pt able to start progress quad strength in open chain and had good tolerance to hip and HS strength. Self STM and manual techniques demo'd for improvement patellar mobility. Plan to continue per protocol. Consider light shuttle pressing, STS, and isometric press/holds. Patient will benefit from skilled physical therapy in order to improve function and reduce impairment.  OBJECTIVE IMPAIRMENTS: Abnormal gait, decreased activity tolerance, decreased balance, decreased endurance, decreased mobility, difficulty walking, decreased ROM, decreased strength, impaired flexibility, improper body mechanics, and pain.   ACTIVITY LIMITATIONS: carrying, lifting, bending, standing, squatting, sleeping, stairs, transfers, bathing, hygiene/grooming, locomotion level, and caring for others  PARTICIPATION LIMITATIONS: meal prep, cleaning, laundry, shopping, community activity, occupation, and yard work  PERSONAL FACTORS: Fitness, Time since onset of injury/illness/exacerbation, and 3+ comorbidities: HTN, HLD, DM, idiopathic scoliosis are also affecting patient's functional outcome.   REHAB POTENTIAL: Good  CLINICAL DECISION MAKING: Stable/uncomplicated  EVALUATION COMPLEXITY: Low   GOALS: Goals reviewed with  patient? Yes  SHORT TERM GOALS: Target date: 10/14/2023    Patient will be independent with HEP in order to improve functional outcomes. Baseline: Goal status: INITIAL  2.  Patient will report at least 25% improvement in symptoms for improved quality of life. Baseline: Goal status: INITIAL  3.  Patient will perform SLR without lag. Baseline:  Goal status: INITIAL    LONG TERM GOALS: Target date: 12/09/2023    Patient will report at least 75% improvement in symptoms for improved quality of life. Baseline:  Goal status: INITIAL  2.  Patient will improve LEFS score by at least 18 points in order to indicate improved tolerance to activity. Baseline:  Goal status: INITIAL  3.  Patient will be able to squat without compensation in order to demonstrate improved LE strength. Baseline:  Goal status: INITIAL  4.  Patient will demonstrate full left knee ROM for improved ability to squat and navigate stairs. Baseline:  Goal status: INITIAL  5.  Patient will be able to perform forward step down test without deviation in order to demonstrate improved LE strength and motor control.  Baseline:  Goal status: INITIAL  6.  Patient will demonstrate grade of 5/5 MMT grade in all tested musculature as evidence of improved strength to assist with stair ambulation and gait.   Baseline:  Goal status: INITIAL   PLAN:  PT FREQUENCY: 1-2x/week  PT DURATION: 12 weeks  PLANNED INTERVENTIONS: 97164- PT Re-evaluation, 97110-Therapeutic exercises, 97530- Therapeutic activity, 97112- Neuromuscular re-education, 97535- Self Care, 16109- Manual therapy, [phone]- Gait training, [phone]- Orthotic Fit/training, [phone]- Canalith repositioning, [id number]- Aquatic Therapy, [phone]- Splinting, Patient/Family education, Balance training, Stair training, Taping, Dry Needling, Joint mobilization, Joint manipulation, Spinal manipulation, Spinal mobilization, Scar mobilization, and DME instructions.  PLAN FOR NEXT SESSION:  per op note follow the MPFL reconstruction protocol   Silver Dross, PT 10/13/2023, 8:56 AM

## 2023-10-17 ENCOUNTER — Ambulatory Visit (HOSPITAL_BASED_OUTPATIENT_CLINIC_OR_DEPARTMENT_OTHER): Admitting: Physical Therapy

## 2023-10-17 ENCOUNTER — Encounter (HOSPITAL_BASED_OUTPATIENT_CLINIC_OR_DEPARTMENT_OTHER): Payer: Self-pay | Admitting: Physical Therapy

## 2023-10-17 DIAGNOSIS — M6281 Muscle weakness (generalized): Secondary | ICD-10-CM

## 2023-10-17 DIAGNOSIS — G8929 Other chronic pain: Secondary | ICD-10-CM

## 2023-10-17 DIAGNOSIS — R262 Difficulty in walking, not elsewhere classified: Secondary | ICD-10-CM

## 2023-10-17 DIAGNOSIS — M25562 Pain in left knee: Secondary | ICD-10-CM | POA: Diagnosis not present

## 2023-10-17 NOTE — Therapy (Signed)
 OUTPATIENT PHYSICAL THERAPY LOWER EXTREMITY EVALUATION   Patient Name: Megan [name] MRN: [id number] DOB:[birth year], 53 y.o., female Today's Date: 10/17/2023  END OF SESSION:  PT End of Session - 10/17/23 0815     Visit Number 5    Number of Visits 24    Date for PT Re-Evaluation 12/09/23    Authorization Type [employer]    PT Start Time 0801    PT Stop Time 0842    PT Time Calculation (min) 41 min    Activity Tolerance Patient tolerated treatment well    Behavior During Therapy [facility] for tasks assessed/performed                 Past Medical History:  Diagnosis Date   Gestational diabetes mellitus in childbirth    Hypertension    Past Surgical History:  Procedure Laterality Date   IMPLANTATION, MATRIX-INDUCED AUTOLOGOUS CHONDROCYTES Left 09/12/2023   Procedure: IMPLANTATION, MATRIX-INDUCED AUTOLOGOUS CHONDROCYTES;  Surgeon: Wilhelmenia Harada, MD;  Location: [facility];  Service: Orthopedics;  Laterality: Left;   KNEE ARTHROSCOPY WITH MACI  CARTILAGE HARVEST Left 07/24/2023   Procedure: ARTHROSCOPY, KNEE, WITH CARTILAGE PROCUREMENT FOR CULTURE;  Surgeon: Wilhelmenia Harada, MD;  Location: [facility];  Service: Orthopedics;  Laterality: Left;  LEFT KNEE ARTHROSCOPY WITH MACI  HARVEST   MEDIAL PATELLOFEMORAL LIGAMENT REPAIR Left 09/12/2023   Procedure: RECONSTRUCTION, LIGAMENT, MEDIAL PATELLOFEMORAL;  Surgeon: Wilhelmenia Harada, MD;  Location: [facility];  Service: Orthopedics;  Laterality: Left;  LEFT KNEE MEDIAL PATELLOFEMORAL LIGAMENT RECONSTRUCTION ANDMATRIX ASSOCIATED CHONDROCYTES IMPLANTATION (PATELLA AND FEMUR)   TONSILLECTOMY AND ADENOIDECTOMY     TYMPANOSTOMY TUBE PLACEMENT  1984   Patient Active Problem List   Diagnosis Date Noted   Patellar instability of left knee 07/24/2023   High serum copper  level 03/01/2023   Vitamin B12 deficiency 10/31/2022   Encounter for weight management 10/31/2022   Left lumbar radiculitis 07/13/2022    Right hand pain 10/28/2021   Raynaud's disease, idiopathic 07/08/2019   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HCC) 03/06/2019   Near syncope 02/12/2019   Microalbuminuria due to type 2 diabetes mellitus (HCC) 02/12/2019   Guyon syndrome, 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   Cubital tunnel syndrome on 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   Flexor carpi ulnaris tenosynovitis 08/30/2018   Patellar subluxation, left, initial encounter 09/23/2015   Plantar fasciitis, bilateral 01/08/2014   Reaction, situational, acute, to stress 10/16/2013   Lumbar pars defect 10/15/2012   Idiopathic scoliosis 10/11/2012   Essential hypertension, benign 12/27/2011   Hyperlipidemia 12/27/2011   Diabetes type 2, controlled (HCC) 12/27/2011   POLYCYSTIC OVARIAN DISEASE 05/06/2008    PCP: Cydney Draft, MD  REFERRING PROVIDER: Wilhelmenia Harada, MD  REFERRING DIAG: [phone] (ICD-10-CM) - Patellar instability of left knee; MPFL and MACI  implant DOS 09/12/23  THERAPY DIAG:  Chronic pain of left knee  Muscle weakness (generalized)  Difficulty in walking, not elsewhere classified  Rationale for Evaluation and Treatment: Rehabilitation  ONSET DATE: DOS 09/12/23  SUBJECTIVE:   SUBJECTIVE STATEMENT: Pt reports ant knee soreness but no pain. Stiffness improves with exercise and moving it. Had mild swelling   PERTINENT HISTORY: HTN, HLD, DM, idiopathic scoliosis PAIN:  Are you having pain? No: NPRS scale: 0/10 Pain location: L knee Pain description: aching, pulling Aggravating factors: weightbearing Relieving factors: rest  PRECAUTIONS: Knee  WEIGHT BEARING RESTRICTIONS: WBAT  FALLS:  Has patient fallen in last 6 months? No   OCCUPATION: Supervisor at [employer]  PLOF: Independent  PATIENT GOALS: to walk right and without pain  OBJECTIVE: (objective  measures from initial evaluation unless otherwise dated)   PATIENT SURVEYS: LEFS 23/80  COGNITION: Overall cognitive status: Within functional limits for tasks  assessed     SENSATION:WFL  EDEMA: gross edema L knee  POSTURE: No Significant postural limitations  PALPATION: grossly TTP L knee  LOWER EXTREMITY ROM:  Active ROM Right eval Left eval  Hip flexion    Hip extension    Hip abduction    Hip adduction    Hip internal rotation    Hip external rotation    Knee flexion    Knee extension  0  Ankle dorsiflexion  53  Ankle plantarflexion    Ankle inversion    Ankle eversion     (Blank rows = not tested) *= pain/symptoms  LOWER EXTREMITY MMT: NT due to post op status; good quad set on L, not able to perform SLR  MMT Right eval Left eval  Hip flexion    Hip extension    Hip abduction    Hip adduction    Hip internal rotation    Hip external rotation    Knee flexion    Knee extension    Ankle dorsiflexion    Ankle plantarflexion    Ankle inversion    Ankle eversion     (Blank rows = not tested) *= pain/symptoms   GAIT: Distance walked: 50 feet Assistive device utilized: None Level of assistance: Modified independence Comments: LLE in brace, locked in extension   TODAY'S TREATMENT:                                                                                                                              DATE:   6/10  Upright bike half revs 6 min  Shuttle DL leg press 3s holds at bottom 3x10 High table STS 3x10 (progressively drop height for stretch) SL calf raise 3x12 SLS 30s 3x Calf stretch with quad set 3s 2x10 SAQ with focus on hyper ext 3lbs 2x20   6/6  Recumbent bike half revs 7 min Flexion to 90 Propped quad set with 2.5 weight overpressure 3x10 2s  Wall slide flexion 10s 15x LAQ 2lbs 3x12 Bridge 4x8  Patellar mobs grade III in all 4 directions  Skin rolling and xfriction scar massage 6/3  Recumbent bike half revs 6.5 min Flexion 75; ext  Propped quad set with 2.5 weight overpressure 3x10 2s  SAQ 2.5lbs 3x10 LAQ 2x10 Knee ext isometric 5s 2x10 SLS 30s 4x  Tall table STS  2x10   5/23  Patellar mobs inf and sup grade III  PROM flexion to tolerance (55 deg); ext to -3 Calf stretch with quad set 2x10 3s SLR 10x with quad set focus (no extensor lag)  Taiglate stretch 5x 10x ( up to 71 deg)   Gait: single crutch to reduce antalgic pattern; walking safety  HEP updates, ROM and quad firing focus    Eval:  Eval, dressing change, HEP, Education  PATIENT EDUCATION:  Education details: surgical precautions and goals, anatomy, exercise progression, DOMS expectations, muscle firing,  envelope of function, HEP, POC  Person educated: Patient Education method: Explanation, Demonstration, and Handouts Education comprehension: verbalized understanding, returned demonstration, verbal cues required, and tactile cues required  HOME EXERCISE PROGRAM: Access Code: [email or web address] (electronic)  URL: [email or web address] Date: 09/16/2023 Prepared by: Debria Fang Zaunegger    ASSESSMENT:  CLINICAL IMPRESSION: Pt is 5 wks post op at this time. Pt exercise progress to include light CKC, balance, loaded flexion and end range ext holds. Pt without pain during session and maintain knee hyper ext. Flexion is still at 90 at this time but improves with loaded stretching. Plan for pt to continue with previously updated HEP for a full week and progress STS, balance, and CKC exercise for HEP at next. Patient will benefit from skilled physical therapy in order to improve function and reduce impairment.  OBJECTIVE IMPAIRMENTS: Abnormal gait, decreased activity tolerance, decreased balance, decreased endurance, decreased mobility, difficulty walking, decreased ROM, decreased strength, impaired flexibility, improper body mechanics, and pain.   ACTIVITY LIMITATIONS: carrying, lifting, bending, standing, squatting, sleeping, stairs, transfers, bathing, hygiene/grooming, locomotion level, and caring for others  PARTICIPATION LIMITATIONS: meal prep, cleaning, laundry, shopping,  community activity, occupation, and yard work  PERSONAL FACTORS: Fitness, Time since onset of injury/illness/exacerbation, and 3+ comorbidities: HTN, HLD, DM, idiopathic scoliosis are also affecting patient's functional outcome.   REHAB POTENTIAL: Good  CLINICAL DECISION MAKING: Stable/uncomplicated  EVALUATION COMPLEXITY: Low   GOALS: Goals reviewed with patient? Yes  SHORT TERM GOALS: Target date: 10/14/2023    Patient will be independent with HEP in order to improve functional outcomes. Baseline: Goal status: INITIAL  2.  Patient will report at least 25% improvement in symptoms for improved quality of life. Baseline: Goal status: INITIAL  3.  Patient will perform SLR without lag. Baseline:  Goal status: INITIAL    LONG TERM GOALS: Target date: 12/09/2023    Patient will report at least 75% improvement in symptoms for improved quality of life. Baseline:  Goal status: INITIAL  2.  Patient will improve LEFS score by at least 18 points in order to indicate improved tolerance to activity. Baseline:  Goal status: INITIAL  3.  Patient will be able to squat without compensation in order to demonstrate improved LE strength. Baseline:  Goal status: INITIAL  4.  Patient will demonstrate full left knee ROM for improved ability to squat and navigate stairs. Baseline:  Goal status: INITIAL  5.  Patient will be able to perform forward step down test without deviation in order to demonstrate improved LE strength and motor control.  Baseline:  Goal status: INITIAL  6.  Patient will demonstrate grade of 5/5 MMT grade in all tested musculature as evidence of improved strength to assist with stair ambulation and gait.   Baseline:  Goal status: INITIAL   PLAN:  PT FREQUENCY: 1-2x/week  PT DURATION: 12 weeks  PLANNED INTERVENTIONS: 97164- PT Re-evaluation, 97110-Therapeutic exercises, 97530- Therapeutic activity, 97112- Neuromuscular re-education, 97535- Self Care, 41324-  Manual therapy, [phone]- Gait training, [phone]- Orthotic Fit/training, [phone]- Canalith repositioning, [id number]- Aquatic Therapy, [phone]- Splinting, Patient/Family education, Balance training, Stair training, Taping, Dry Needling, Joint mobilization, Joint manipulation, Spinal manipulation, Spinal mobilization, Scar mobilization, and DME instructions.  PLAN FOR NEXT SESSION: per op note follow the MPFL reconstruction protocol   Silver Dross, PT 10/17/2023, 8:43 AM

## 2023-10-19 ENCOUNTER — Ambulatory Visit (HOSPITAL_BASED_OUTPATIENT_CLINIC_OR_DEPARTMENT_OTHER): Admitting: Student

## 2023-10-19 DIAGNOSIS — M25362 Other instability, left knee: Secondary | ICD-10-CM

## 2023-10-19 NOTE — Progress Notes (Signed)
 Post Operative Evaluation    Procedure/Date of Surgery: Left knee Maci  placement with MPFL reconstruction 09/12/23  Interval History:   Patient presents to clinic today 6 weeks status post the above procedure.  She has been working with physical therapy and states that this is going well.  She has been able to discontinue use of the brace and crutches.  Pain is well-controlled without use of pain medication.   PMH/PSH/Family History/Social History/Meds/Allergies:    Past Medical History:  Diagnosis Date   Gestational diabetes mellitus in childbirth    Hypertension    Past Surgical History:  Procedure Laterality Date   IMPLANTATION, MATRIX-INDUCED AUTOLOGOUS CHONDROCYTES Left 09/12/2023   Procedure: IMPLANTATION, MATRIX-INDUCED AUTOLOGOUS CHONDROCYTES;  Surgeon: Megan Harada, MD;  Location: [facility];  Service: Orthopedics;  Laterality: Left;   KNEE ARTHROSCOPY WITH MACI  CARTILAGE HARVEST Left 07/24/2023   Procedure: ARTHROSCOPY, KNEE, WITH CARTILAGE PROCUREMENT FOR CULTURE;  Surgeon: Megan Harada, MD;  Location: [facility];  Service: Orthopedics;  Laterality: Left;  LEFT KNEE ARTHROSCOPY WITH MACI  HARVEST   MEDIAL PATELLOFEMORAL LIGAMENT REPAIR Left 09/12/2023   Procedure: RECONSTRUCTION, LIGAMENT, MEDIAL PATELLOFEMORAL;  Surgeon: Megan Harada, MD;  Location: [facility];  Service: Orthopedics;  Laterality: Left;  LEFT KNEE MEDIAL PATELLOFEMORAL LIGAMENT RECONSTRUCTION ANDMATRIX ASSOCIATED CHONDROCYTES IMPLANTATION (PATELLA AND FEMUR)   TONSILLECTOMY AND ADENOIDECTOMY     TYMPANOSTOMY TUBE PLACEMENT  1984   Social History   Socioeconomic History   Marital status: Single    Spouse name: Megan [name]   Number of children: Not on file   Years of education: Not on file   Highest education level: Associate degree: academic program  Occupational History   Occupation: Asst Diplomatic Services operational officer    Comment:  [employer]   Tobacco Use   Smoking status: Never   Smokeless tobacco: Never  Vaping Use   Vaping status: Never Used  Substance and Sexual Activity   Alcohol use: No   Drug use: No   Sexual activity: Yes    Partners: Male  Other Topics Concern   Not on file  Social History Narrative   2 caffeine  drinks per day. No regular exercise. She works full-time and is going to school.   Social Drivers of Corporate investment banker Strain: Low Risk  (08/26/2023)   Overall Financial Resource Strain (CARDIA)    Difficulty of Paying Living Expenses: Not very hard  Food Insecurity: No Food Insecurity (08/26/2023)   Hunger Vital Sign    Worried About Running Out of Food in the Last Year: Never true    Ran Out of Food in the Last Year: Never true  Transportation Needs: No Transportation Needs (08/26/2023)   PRAPARE - Administrator, Civil Service (Medical): No    Lack of Transportation (Non-Medical): No  Physical Activity: Unknown (08/26/2023)   Exercise Vital Sign    Days of Exercise per Week: 0 days    Minutes of Exercise per Session: Not on file  Stress: No Stress Concern Present (08/26/2023)   Megan [name] of Occupational Health - Occupational Stress Questionnaire    Feeling of Stress : Only a little  Social Connections: Socially Isolated (08/26/2023)   Social Connection and Isolation Panel    Frequency of Communication with Friends and Family: Once a week  Frequency of Social Gatherings with Friends and Family: Once a week    Attends Religious Services: Never    Database administrator or Organizations: No    Attends Engineer, structural: Not on file    Marital Status: Never married   Family History  Problem Relation Age of Onset   Hypertension Mother    Celiac disease Mother    Hypertension Father    Hypertension Sister    Hypertension Brother    Peripheral vascular disease Maternal Grandmother    Diabetes Paternal Aunt    Diabetes Maternal Uncle     Diabetes Paternal Aunt    Colon cancer Maternal Grandfather    Crohn's disease Cousin    Allergies  Allergen Reactions   Metformin  And Related Other (See Comments)    GI upset    Sertraline  Nausea Only    sweats   Current Outpatient Medications  Medication Sig Dispense Refill   AMBULATORY NON FORMULARY MEDICATION One touch ultra 2 glucose system  Use to test once daily as directed  Please schedule f/u appt 1 each 0   amLODipine  (NORVASC ) 5 MG tablet TAKE 1 TABLET BY MOUTH DAILY 90 tablet 3   aspirin  EC 325 MG tablet Take 1 tablet (325 mg total) by mouth daily. (Patient not taking: Reported on 08/30/2023) 14 tablet 0   atorvastatin  (LIPITOR) 40 MG tablet Take 1 tablet (40 mg total) by mouth daily. 90 tablet 3   losartan -hydrochlorothiazide  (HYZAAR) 100-12.5 MG tablet TAKE 1 TABLET BY MOUTH DAILY 90 tablet 3   norgestrel-ethinyl estradiol (LO/OVRAL,CRYSELLE) 0.3-30 MG-MCG tablet Take 1 tablet by mouth daily.     oxyCODONE  (ROXICODONE ) 5 MG immediate release tablet Take 1 tablet (5 mg total) by mouth every 4 (four) hours as needed for severe pain (pain score 7-10) or breakthrough pain. (Patient not taking: Reported on 08/30/2023) 15 tablet 0   Semaglutide , 2 MG/DOSE, (OZEMPIC , 2 MG/DOSE,) 8 MG/3ML SOPN Inject 2 mg (0.75 ml) into the skin once a week. 9 mL 2   No current facility-administered medications for this visit.   No results found.  Review of Systems:   A ROS was performed including pertinent positives and negatives as documented in the HPI.   Musculoskeletal Exam:    There were no vitals taken for this visit.  Incisions of the left knee are well-appearing without evidence of erythema or drainage.  No point tenderness throughout the knee.  Range of motion from 0 to 90 degrees.  Knee remains mildly swollen.   Imaging:     Assessment:   Patient is 6 weeks status post left knee MPFL reconstruction and Maci  implantation progressing very well.  She has been able to  discontinue the brace and is working with physical therapy.  No further concerns at this time.  She can continue progressing per protocol and return to clinic for follow-up in another 6 weeks with Dr. Hermina Loosen.  Plan :    - Return to clinic in 6 weeks for reassessment       I personally saw and evaluated the patient, and participated in the management and treatment plan.   Sharrell Deck, PA-C Orthopedics   This document was dictated using Conservation officer, historic buildings. A reasonable attempt at proof reading has been made to minimize errors.

## 2023-10-20 ENCOUNTER — Ambulatory Visit (HOSPITAL_BASED_OUTPATIENT_CLINIC_OR_DEPARTMENT_OTHER)

## 2023-10-20 ENCOUNTER — Encounter (HOSPITAL_BASED_OUTPATIENT_CLINIC_OR_DEPARTMENT_OTHER): Payer: Self-pay

## 2023-10-20 DIAGNOSIS — G8929 Other chronic pain: Secondary | ICD-10-CM

## 2023-10-20 DIAGNOSIS — M25562 Pain in left knee: Secondary | ICD-10-CM | POA: Diagnosis not present

## 2023-10-20 DIAGNOSIS — R262 Difficulty in walking, not elsewhere classified: Secondary | ICD-10-CM

## 2023-10-20 DIAGNOSIS — M6281 Muscle weakness (generalized): Secondary | ICD-10-CM

## 2023-10-20 NOTE — Therapy (Signed)
 OUTPATIENT PHYSICAL THERAPY LOWER EXTREMITY EVALUATION   Patient Name: Megan [name] MRN: [id number] DOB:[birth year], 53 y.o., female Today's Date: 10/20/2023  END OF SESSION:  PT End of Session - 10/20/23 0828     Visit Number 6    Number of Visits 24    Date for PT Re-Evaluation 12/09/23    Authorization Type Aetna    PT Start Time 0803    PT Stop Time 0845    PT Time Calculation (min) 42 min    Activity Tolerance Patient tolerated treatment well    Behavior During Therapy [facility] for tasks assessed/performed               Past Medical History:  Diagnosis Date   Gestational diabetes mellitus in childbirth    Hypertension    Past Surgical History:  Procedure Laterality Date   IMPLANTATION, MATRIX-INDUCED AUTOLOGOUS CHONDROCYTES Left 09/12/2023   Procedure: IMPLANTATION, MATRIX-INDUCED AUTOLOGOUS CHONDROCYTES;  Surgeon: Wilhelmenia Harada, MD;  Location: [facility];  Service: Orthopedics;  Laterality: Left;   KNEE ARTHROSCOPY WITH MACI  CARTILAGE HARVEST Left 07/24/2023   Procedure: ARTHROSCOPY, KNEE, WITH CARTILAGE PROCUREMENT FOR CULTURE;  Surgeon: Wilhelmenia Harada, MD;  Location: [facility];  Service: Orthopedics;  Laterality: Left;  LEFT KNEE ARTHROSCOPY WITH MACI  HARVEST   MEDIAL PATELLOFEMORAL LIGAMENT REPAIR Left 09/12/2023   Procedure: RECONSTRUCTION, LIGAMENT, MEDIAL PATELLOFEMORAL;  Surgeon: Wilhelmenia Harada, MD;  Location: [facility];  Service: Orthopedics;  Laterality: Left;  LEFT KNEE MEDIAL PATELLOFEMORAL LIGAMENT RECONSTRUCTION ANDMATRIX ASSOCIATED CHONDROCYTES IMPLANTATION (PATELLA AND FEMUR)   TONSILLECTOMY AND ADENOIDECTOMY     TYMPANOSTOMY TUBE PLACEMENT  1984   Patient Active Problem List   Diagnosis Date Noted   Patellar instability of left knee 07/24/2023   High serum copper  level 03/01/2023   Vitamin B12 deficiency 10/31/2022   Encounter for weight management 10/31/2022   Left lumbar radiculitis 07/13/2022   Right  hand pain 10/28/2021   Raynaud's disease, idiopathic 07/08/2019   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HCC) 03/06/2019   Near syncope 02/12/2019   Microalbuminuria due to type 2 diabetes mellitus (HCC) 02/12/2019   Guyon syndrome, 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   Cubital tunnel syndrome on 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   Flexor carpi ulnaris tenosynovitis 08/30/2018   Patellar subluxation, left, initial encounter 09/23/2015   Plantar fasciitis, bilateral 01/08/2014   Reaction, situational, acute, to stress 10/16/2013   Lumbar pars defect 10/15/2012   Idiopathic scoliosis 10/11/2012   Essential hypertension, benign 12/27/2011   Hyperlipidemia 12/27/2011   Diabetes type 2, controlled (HCC) 12/27/2011   POLYCYSTIC OVARIAN DISEASE 05/06/2008    PCP: Cydney Draft, MD  REFERRING PROVIDER: Wilhelmenia Harada, MD  REFERRING DIAG: [phone] (ICD-10-CM) - Patellar instability of left knee; MPFL and MACI  implant DOS 09/12/23  THERAPY DIAG:  Chronic pain of left knee  Muscle weakness (generalized)  Difficulty in walking, not elsewhere classified  Rationale for Evaluation and Treatment: Rehabilitation  ONSET DATE: DOS 09/12/23  SUBJECTIVE:   SUBJECTIVE STATEMENT: Pt reports no pain at entry. PERTINENT HISTORY: HTN, HLD, DM, idiopathic scoliosis PAIN:  Are you having pain? No: NPRS scale: 0/10 Pain location: L knee Pain description: aching, pulling Aggravating factors: weightbearing Relieving factors: rest  PRECAUTIONS: Knee  WEIGHT BEARING RESTRICTIONS: WBAT  FALLS:  Has patient fallen in last 6 months? No   OCCUPATION: Supervisor at [employer]  PLOF: Independent  PATIENT GOALS: to walk right and without pain  OBJECTIVE: (objective measures from initial evaluation unless otherwise dated)   PATIENT SURVEYS: LEFS 23/80  COGNITION: Overall  cognitive status: Within functional limits for tasks assessed     SENSATION:WFL  EDEMA: gross edema L knee  POSTURE: No Significant postural limitations  PALPATION:  grossly TTP L knee  LOWER EXTREMITY ROM:  Active ROM Right eval Left eval  Hip flexion    Hip extension    Hip abduction    Hip adduction    Hip internal rotation    Hip external rotation    Knee flexion    Knee extension  0  Ankle dorsiflexion  53  Ankle plantarflexion    Ankle inversion    Ankle eversion     (Blank rows = not tested) *= pain/symptoms  LOWER EXTREMITY MMT: NT due to post op status; good quad set on L, not able to perform SLR  MMT Right eval Left eval  Hip flexion    Hip extension    Hip abduction    Hip adduction    Hip internal rotation    Hip external rotation    Knee flexion    Knee extension    Ankle dorsiflexion    Ankle plantarflexion    Ankle inversion    Ankle eversion     (Blank rows = not tested) *= pain/symptoms   GAIT: Distance walked: 50 feet Assistive device utilized: None Level of assistance: Modified independence Comments: LLE in brace, locked in extension   TODAY'S TREATMENT:                                                                                                                              DATE:    6/13  Sci fit bike half revs 6 min Supine SLR 3x10 PROM L knee Bridges 3x10 Sidelying hip abduction 3x10 Shuttle DL leg press 3s holds at bottom 2x10 50# Elevated table STS 3x10  SL calf raise 2x15 SLS 30s LAQ 5 3x10 3#  6/10  Upright bike half revs 6 min  Shuttle DL leg press 3s holds at bottom 3x10 High table STS 3x10 (progressively drop height for stretch) SL calf raise 3x12 SLS 30s 3x Calf stretch with quad set 3s 2x10 SAQ with focus on hyper ext 3lbs 2x20   6/6  Recumbent bike half revs 7 min Flexion to 90 Propped quad set with 2.5 weight overpressure 3x10 2s  Wall slide flexion 10s 15x LAQ 2lbs 3x12 Bridge 4x8  Patellar mobs grade III in all 4 directions  Skin rolling and xfriction scar massage 6/3  Recumbent bike half revs 6.5 min Flexion 75; ext  Propped quad set with 2.5 weight  overpressure 3x10 2s  SAQ 2.5lbs 3x10 LAQ 2x10 Knee ext isometric 5s 2x10 SLS 30s 4x  Tall table STS 2x10   5/23  Patellar mobs inf and sup grade III  PROM flexion to tolerance (55 deg); ext to -3 Calf stretch with quad set 2x10 3s SLR 10x with quad set focus (no extensor lag)  Taiglate stretch 5x 10x ( up to 71 deg)  Gait: single crutch to reduce antalgic pattern; walking safety  HEP updates, ROM and quad firing focus    Eval:  Eval, dressing change, HEP, Education    PATIENT EDUCATION:  Education details: surgical precautions and goals, anatomy, exercise progression, DOMS expectations, muscle firing,  envelope of function, HEP, POC  Person educated: Patient Education method: Explanation, Demonstration, and Handouts Education comprehension: verbalized understanding, returned demonstration, verbal cues required, and tactile cues required  HOME EXERCISE PROGRAM: Access Code: [email or web address] (electronic)  URL: [email or web address] Date: 09/16/2023 Prepared by: Debria Fang Zaunegger    ASSESSMENT:  CLINICAL IMPRESSION: Continued to work on improving knee flexion ROM. She continues with partial revolutions on recumbent bike due to tightness into knee flexion. She had good tolerance for strengthening interventions with no c/o pain. Will continue to progress as tolerated.  OBJECTIVE IMPAIRMENTS: Abnormal gait, decreased activity tolerance, decreased balance, decreased endurance, decreased mobility, difficulty walking, decreased ROM, decreased strength, impaired flexibility, improper body mechanics, and pain.   ACTIVITY LIMITATIONS: carrying, lifting, bending, standing, squatting, sleeping, stairs, transfers, bathing, hygiene/grooming, locomotion level, and caring for others  PARTICIPATION LIMITATIONS: meal prep, cleaning, laundry, shopping, community activity, occupation, and yard work  PERSONAL FACTORS: Fitness, Time since onset of injury/illness/exacerbation, and  3+ comorbidities: HTN, HLD, DM, idiopathic scoliosis are also affecting patient's functional outcome.   REHAB POTENTIAL: Good  CLINICAL DECISION MAKING: Stable/uncomplicated  EVALUATION COMPLEXITY: Low   GOALS: Goals reviewed with patient? Yes  SHORT TERM GOALS: Target date: 10/14/2023    Patient will be independent with HEP in order to improve functional outcomes. Baseline: Goal status: INITIAL  2.  Patient will report at least 25% improvement in symptoms for improved quality of life. Baseline: Goal status: INITIAL  3.  Patient will perform SLR without lag. Baseline:  Goal status: INITIAL    LONG TERM GOALS: Target date: 12/09/2023    Patient will report at least 75% improvement in symptoms for improved quality of life. Baseline:  Goal status: INITIAL  2.  Patient will improve LEFS score by at least 18 points in order to indicate improved tolerance to activity. Baseline:  Goal status: INITIAL  3.  Patient will be able to squat without compensation in order to demonstrate improved LE strength. Baseline:  Goal status: INITIAL  4.  Patient will demonstrate full left knee ROM for improved ability to squat and navigate stairs. Baseline:  Goal status: INITIAL  5.  Patient will be able to perform forward step down test without deviation in order to demonstrate improved LE strength and motor control.  Baseline:  Goal status: INITIAL  6.  Patient will demonstrate grade of 5/5 MMT grade in all tested musculature as evidence of improved strength to assist with stair ambulation and gait.   Baseline:  Goal status: INITIAL   PLAN:  PT FREQUENCY: 1-2x/week  PT DURATION: 12 weeks  PLANNED INTERVENTIONS: 97164- PT Re-evaluation, 97110-Therapeutic exercises, 97530- Therapeutic activity, 97112- Neuromuscular re-education, 97535- Self Care, 13086- Manual therapy, [phone]- Gait training, [phone]- Orthotic Fit/training, [phone]- Canalith repositioning, [id number]- Aquatic Therapy, [phone]-  Splinting, Patient/Family education, Balance training, Stair training, Taping, Dry Needling, Joint mobilization, Joint manipulation, Spinal manipulation, Spinal mobilization, Scar mobilization, and DME instructions.  PLAN FOR NEXT SESSION: per op note follow the MPFL reconstruction protocol   Fronie Jewett Telia Amundson, PTA 10/20/2023, 11:14 AM

## 2023-10-26 ENCOUNTER — Encounter (HOSPITAL_BASED_OUTPATIENT_CLINIC_OR_DEPARTMENT_OTHER): Admitting: Orthopaedic Surgery

## 2023-10-27 ENCOUNTER — Ambulatory Visit (INDEPENDENT_AMBULATORY_CARE_PROVIDER_SITE_OTHER)

## 2023-10-27 VITALS — BP 114/65 | HR 80 | Ht 64.0 in

## 2023-10-27 DIAGNOSIS — E538 Deficiency of other specified B group vitamins: Secondary | ICD-10-CM | POA: Diagnosis not present

## 2023-10-27 MED ORDER — CYANOCOBALAMIN 1000 MCG/ML IJ SOLN
1000.0000 ug | Freq: Once | INTRAMUSCULAR | Status: AC
  Administered 2023-10-27: 1000 ug via INTRAMUSCULAR

## 2023-10-27 NOTE — Patient Instructions (Signed)
Return in 30 days for next B12 injection nurse visit.

## 2023-10-27 NOTE — Progress Notes (Signed)
   Established Patient Office Visit  Subjective   Patient ID: Megan [name], female    DOB: Nov 24, 1970  Age: 53 y.o. MRN: [id number]  Chief Complaint  Patient presents with   vitamin B12 deficiency    Vitamin B12 injection nurse visit.     HPI  Vitamin B12 deficiency.  Patient denies weakness, irregular heart rate, GI issues or problems with B12 injections.   ROS    Objective:     BP 114/65   Pulse 80   Ht 5' 4 (1.626 m)   SpO2 100%   BMI 32.61 kg/m    Physical Exam   No results found for any visits on 10/27/23.    The 10-year ASCVD risk score (Arnett DK, et al., 2019) is: 2.2%    Assessment & Plan:  Admin 1,026mcg cyanocobalamin  IM left deltoid. Patient tolerated injection well without complications. Return in 30 days for next B12 injection as nurse visit.  Problem List Items Addressed This Visit   None   No follow-ups on file.    Dickie Found, LPN

## 2023-10-30 ENCOUNTER — Encounter (HOSPITAL_BASED_OUTPATIENT_CLINIC_OR_DEPARTMENT_OTHER): Payer: Self-pay | Admitting: Physical Therapy

## 2023-10-30 ENCOUNTER — Ambulatory Visit (HOSPITAL_BASED_OUTPATIENT_CLINIC_OR_DEPARTMENT_OTHER): Payer: Self-pay | Admitting: Physical Therapy

## 2023-10-30 DIAGNOSIS — M6281 Muscle weakness (generalized): Secondary | ICD-10-CM

## 2023-10-30 DIAGNOSIS — R262 Difficulty in walking, not elsewhere classified: Secondary | ICD-10-CM

## 2023-10-30 DIAGNOSIS — M25562 Pain in left knee: Secondary | ICD-10-CM | POA: Diagnosis not present

## 2023-10-30 DIAGNOSIS — G8929 Other chronic pain: Secondary | ICD-10-CM

## 2023-10-30 NOTE — Therapy (Signed)
 OUTPATIENT PHYSICAL THERAPY LOWER EXTREMITY EVALUATION   Patient Name: Megan [name] MRN: [id number] DOB:06/28/1970, 53 y.o., female Today's Date: 10/30/2023  END OF SESSION:  PT End of Session - 10/30/23 0845     Visit Number 7    Number of Visits 24    Date for PT Re-Evaluation 12/09/23    Authorization Type Aetna    PT Start Time 0802    PT Stop Time 0844    PT Time Calculation (min) 42 min    Activity Tolerance Patient tolerated treatment well    Behavior During Therapy [facility] for tasks assessed/performed                Past Medical History:  Diagnosis Date   Gestational diabetes mellitus in childbirth    Hypertension    Past Surgical History:  Procedure Laterality Date   IMPLANTATION, MATRIX-INDUCED AUTOLOGOUS CHONDROCYTES Left 09/12/2023   Procedure: IMPLANTATION, MATRIX-INDUCED AUTOLOGOUS CHONDROCYTES;  Surgeon: Genelle Standing, MD;  Location: [facility];  Service: Orthopedics;  Laterality: Left;   KNEE ARTHROSCOPY WITH MACI  CARTILAGE HARVEST Left 07/24/2023   Procedure: ARTHROSCOPY, KNEE, WITH CARTILAGE PROCUREMENT FOR CULTURE;  Surgeon: Genelle Standing, MD;  Location: [facility];  Service: Orthopedics;  Laterality: Left;  LEFT KNEE ARTHROSCOPY WITH MACI  HARVEST   MEDIAL PATELLOFEMORAL LIGAMENT REPAIR Left 09/12/2023   Procedure: RECONSTRUCTION, LIGAMENT, MEDIAL PATELLOFEMORAL;  Surgeon: Genelle Standing, MD;  Location: [facility];  Service: Orthopedics;  Laterality: Left;  LEFT KNEE MEDIAL PATELLOFEMORAL LIGAMENT RECONSTRUCTION ANDMATRIX ASSOCIATED CHONDROCYTES IMPLANTATION (PATELLA AND FEMUR)   TONSILLECTOMY AND ADENOIDECTOMY     TYMPANOSTOMY TUBE PLACEMENT  1984   Patient Active Problem List   Diagnosis Date Noted   Patellar instability of left knee 07/24/2023   High serum copper  level 03/01/2023   Vitamin B12 deficiency 10/31/2022   Encounter for weight management 10/31/2022   Left lumbar radiculitis 07/13/2022    Right hand pain 10/28/2021   Raynaud's disease, idiopathic 07/08/2019   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HCC) 03/06/2019   Near syncope 02/12/2019   Microalbuminuria due to type 2 diabetes mellitus (HCC) 02/12/2019   Guyon syndrome, 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   Cubital tunnel syndrome on 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   Flexor carpi ulnaris tenosynovitis 08/30/2018   Patellar subluxation, left, initial encounter 09/23/2015   Plantar fasciitis, bilateral 01/08/2014   Reaction, situational, acute, to stress 10/16/2013   Lumbar pars defect 10/15/2012   Idiopathic scoliosis 10/11/2012   Essential hypertension, benign 12/27/2011   Hyperlipidemia 12/27/2011   Diabetes type 2, controlled (HCC) 12/27/2011   POLYCYSTIC OVARIAN DISEASE 05/06/2008    PCP: Alvan Dorothyann BIRCH, MD  REFERRING PROVIDER: Genelle Standing, MD  REFERRING DIAG: [phone] (ICD-10-CM) - Patellar instability of left knee; MPFL and MACI  implant DOS 09/12/23  THERAPY DIAG:  Chronic pain of left knee  Muscle weakness (generalized)  Difficulty in walking, not elsewhere classified  Rationale for Evaluation and Treatment: Rehabilitation  ONSET DATE: DOS 09/12/23  SUBJECTIVE:   SUBJECTIVE STATEMENT: Pt without pain since last session. Knee ROM is still normal. Pt has not had issues with the knee since last session. Pt is nearly 7 wks at this time.  PERTINENT HISTORY: HTN, HLD, DM, idiopathic scoliosis PAIN:  Are you having pain? No: NPRS scale: 0/10 Pain location: L knee Pain description: aching, pulling Aggravating factors: weightbearing Relieving factors: rest  PRECAUTIONS: Knee  WEIGHT BEARING RESTRICTIONS: WBAT  FALLS:  Has patient fallen in last 6 months? No   OCCUPATION: Supervisor at [employer]  PLOF: Independent  PATIENT  GOALS: to walk right and without pain  OBJECTIVE: (objective measures from initial evaluation unless otherwise dated)   PATIENT SURVEYS: LEFS 23/80  COGNITION: Overall cognitive status: Within functional limits for  tasks assessed     SENSATION:WFL  EDEMA: gross edema L knee  POSTURE: No Significant postural limitations  PALPATION: grossly TTP L knee  LOWER EXTREMITY ROM:  Active ROM Right eval Left eval  Hip flexion    Hip extension    Hip abduction    Hip adduction    Hip internal rotation    Hip external rotation    Knee flexion    Knee extension  0  Ankle dorsiflexion  53  Ankle plantarflexion    Ankle inversion    Ankle eversion     (Blank rows = not tested) *= pain/symptoms  LOWER EXTREMITY MMT: NT due to post op status; good quad set on L, not able to perform SLR  MMT Right eval Left eval  Hip flexion    Hip extension    Hip abduction    Hip adduction    Hip internal rotation    Hip external rotation    Knee flexion    Knee extension    Ankle dorsiflexion    Ankle plantarflexion    Ankle inversion    Ankle eversion     (Blank rows = not tested) *= pain/symptoms   GAIT: Distance walked: 50 feet Assistive device utilized: None Level of assistance: Modified independence Comments: LLE in brace, locked in extension   TODAY'S TREATMENT:                                                                                                                              DATE:   6/23  Nu step lvl 6 6 min  STS from low table 4x8  Bridge on ball 3x10 (knee bent) 4 lateral step down 3x10 single UE support Grey leg press 40lbs 4x8 Knee ext machine 4x8 20lbs DL (29/69 left right)  SL balance on airex 30s 4x    6/13  Sci fit bike half revs 6 min Supine SLR 3x10 PROM L knee Bridges 3x10 Sidelying hip abduction 3x10 Shuttle DL leg press 3s holds at bottom 2x10 50# Elevated table STS 3x10  SL calf raise 2x15 SLS 30s LAQ 5 3x10 3#  6/10  Upright bike half revs 6 min  Shuttle DL leg press 3s holds at bottom 3x10 High table STS 3x10 (progressively drop height for stretch) SL calf raise 3x12 SLS 30s 3x Calf stretch with quad set 3s 2x10 SAQ with focus on  hyper ext 3lbs 2x20   6/6  Recumbent bike half revs 7 min Flexion to 90 Propped quad set with 2.5 weight overpressure 3x10 2s  Wall slide flexion 10s 15x LAQ 2lbs 3x12 Bridge 4x8  Patellar mobs grade III in all 4 directions  Skin rolling and xfriction scar massage 6/3  Recumbent bike half revs 6.5 min Flexion  75; ext  Propped quad set with 2.5 weight overpressure 3x10 2s  SAQ 2.5lbs 3x10 LAQ 2x10 Knee ext isometric 5s 2x10 SLS 30s 4x  Tall table STS 2x10   5/23  Patellar mobs inf and sup grade III  PROM flexion to tolerance (55 deg); ext to -3 Calf stretch with quad set 2x10 3s SLR 10x with quad set focus (no extensor lag)  Taiglate stretch 5x 10x ( up to 71 deg)   Gait: single crutch to reduce antalgic pattern; walking safety  HEP updates, ROM and quad firing focus    Eval:  Eval, dressing change, HEP, Education    PATIENT EDUCATION:  Education details: surgical precautions and goals, anatomy, exercise progression, DOMS expectations, muscle firing,  envelope of function, HEP, POC  Person educated: Patient Education method: Explanation, Demonstration, and Handouts Education comprehension: verbalized understanding, returned demonstration, verbal cues required, and tactile cues required  HOME EXERCISE PROGRAM: Access Code: [email or web address] (electronic)  URL: [email or web address] Date: 09/16/2023 Prepared by: Prentice Zaunegger    ASSESSMENT:  CLINICAL IMPRESSION:  Pt is nearly 7 wks at this time. CKC and OKC exercise progressed with light resistance and RPE kept at 5-6. Pt does have difficulty with control in SL CKC as expected. HEP updated today. No pain noted with exercise. Continue as per protocol. Pt without gym equipment so base HEP largely on freeweight and BW movements.  OBJECTIVE IMPAIRMENTS: Abnormal gait, decreased activity tolerance, decreased balance, decreased endurance, decreased mobility, difficulty walking, decreased ROM, decreased  strength, impaired flexibility, improper body mechanics, and pain.   ACTIVITY LIMITATIONS: carrying, lifting, bending, standing, squatting, sleeping, stairs, transfers, bathing, hygiene/grooming, locomotion level, and caring for others  PARTICIPATION LIMITATIONS: meal prep, cleaning, laundry, shopping, community activity, occupation, and yard work  PERSONAL FACTORS: Fitness, Time since onset of injury/illness/exacerbation, and 3+ comorbidities: HTN, HLD, DM, idiopathic scoliosis are also affecting patient's functional outcome.   REHAB POTENTIAL: Good  CLINICAL DECISION MAKING: Stable/uncomplicated  EVALUATION COMPLEXITY: Low   GOALS: Goals reviewed with patient? Yes  SHORT TERM GOALS: Target date: 10/14/2023    Patient will be independent with HEP in order to improve functional outcomes. Baseline: Goal status: INITIAL  2.  Patient will report at least 25% improvement in symptoms for improved quality of life. Baseline: Goal status: INITIAL  3.  Patient will perform SLR without lag. Baseline:  Goal status: INITIAL    LONG TERM GOALS: Target date: 12/09/2023    Patient will report at least 75% improvement in symptoms for improved quality of life. Baseline:  Goal status: INITIAL  2.  Patient will improve LEFS score by at least 18 points in order to indicate improved tolerance to activity. Baseline:  Goal status: INITIAL  3.  Patient will be able to squat without compensation in order to demonstrate improved LE strength. Baseline:  Goal status: INITIAL  4.  Patient will demonstrate full left knee ROM for improved ability to squat and navigate stairs. Baseline:  Goal status: INITIAL  5.  Patient will be able to perform forward step down test without deviation in order to demonstrate improved LE strength and motor control.  Baseline:  Goal status: INITIAL  6.  Patient will demonstrate grade of 5/5 MMT grade in all tested musculature as evidence of improved strength to  assist with stair ambulation and gait.   Baseline:  Goal status: INITIAL   PLAN:  PT FREQUENCY: 1-2x/week  PT DURATION: 12 weeks  PLANNED INTERVENTIONS: 97164- PT Re-evaluation, 97110-Therapeutic exercises, 97530- Therapeutic activity, [id number]- Neuromuscular re-education,  02464- Self Care, 02859- Manual therapy, [phone]- Gait training, [phone]- Orthotic Fit/training, [phone]- Canalith repositioning, [id number]- Aquatic Therapy, [phone]- Splinting, Patient/Family education, Balance training, Stair training, Taping, Dry Needling, Joint mobilization, Joint manipulation, Spinal manipulation, Spinal mobilization, Scar mobilization, and DME instructions.  PLAN FOR NEXT SESSION: per op note follow the MPFL reconstruction protocol   Dale Call, PT 10/30/2023, 8:48 AM

## 2023-11-02 ENCOUNTER — Encounter (HOSPITAL_BASED_OUTPATIENT_CLINIC_OR_DEPARTMENT_OTHER): Admitting: Physical Therapy

## 2023-11-07 ENCOUNTER — Encounter (HOSPITAL_BASED_OUTPATIENT_CLINIC_OR_DEPARTMENT_OTHER): Payer: Self-pay

## 2023-11-07 ENCOUNTER — Ambulatory Visit (HOSPITAL_BASED_OUTPATIENT_CLINIC_OR_DEPARTMENT_OTHER): Attending: Orthopaedic Surgery

## 2023-11-07 DIAGNOSIS — G8929 Other chronic pain: Secondary | ICD-10-CM | POA: Insufficient documentation

## 2023-11-07 DIAGNOSIS — M25562 Pain in left knee: Secondary | ICD-10-CM | POA: Insufficient documentation

## 2023-11-07 DIAGNOSIS — M6281 Muscle weakness (generalized): Secondary | ICD-10-CM | POA: Insufficient documentation

## 2023-11-07 DIAGNOSIS — R262 Difficulty in walking, not elsewhere classified: Secondary | ICD-10-CM | POA: Insufficient documentation

## 2023-11-07 NOTE — Therapy (Signed)
 OUTPATIENT PHYSICAL THERAPY LOWER EXTREMITY TREATMENT   Patient Name: Megan [name] MRN: [id number] DOB:02-17-1971, 53 y.o., female Today's Date: 11/07/2023  END OF SESSION:  PT End of Session - 11/07/23 0758     Visit Number 8    Number of Visits 24    Date for PT Re-Evaluation 12/09/23    Authorization Type Aet[employer]    PT Start Time 0803    PT Stop Time 0847    PT Time Calculation (min) 44 min    Activity Tolerance Patient tolerated treatment well    Behavior During Therapy [facility] for tasks assessed/performed                 Past Medical History:  Diagnosis Date   Gestational diabetes mellitus in childbirth    Hypertension    Past Surgical History:  Procedure Laterality Date   IMPLANTATION, MATRIX-INDUCED AUTOLOGOUS CHONDROCYTES Left 09/12/2023   Procedure: IMPLANTATION, MATRIX-INDUCED AUTOLOGOUS CHONDROCYTES;  Surgeon: Genelle Standing, MD;  Location: [facility];  Service: Orthopedics;  Laterality: Left;   KNEE ARTHROSCOPY WITH MACI  CARTILAGE HARVEST Left 07/24/2023   Procedure: ARTHROSCOPY, KNEE, WITH CARTILAGE PROCUREMENT FOR CULTURE;  Surgeon: Genelle Standing, MD;  Location: [facility];  Service: Orthopedics;  Laterality: Left;  LEFT KNEE ARTHROSCOPY WITH MACI  HARVEST   MEDIAL PATELLOFEMORAL LIGAMENT REPAIR Left 09/12/2023   Procedure: RECONSTRUCTION, LIGAMENT, MEDIAL PATELLOFEMORAL;  Surgeon: Genelle Standing, MD;  Location: [facility];  Service: Orthopedics;  Laterality: Left;  LEFT KNEE MEDIAL PATELLOFEMORAL LIGAMENT RECONSTRUCTION ANDMATRIX ASSOCIATED CHONDROCYTES IMPLANTATION (PATELLA AND FEMUR)   TONSILLECTOMY AND ADENOIDECTOMY     TYMPANOSTOMY TUBE PLACEMENT  1984   Patient Active Problem List   Diagnosis Date Noted   Patellar instability of left knee 07/24/2023   High serum copper  level 03/01/2023   Vitamin B12 deficiency 10/31/2022   Encounter for weight management 10/31/2022   Left lumbar radiculitis 07/13/2022    Right hand pain 10/28/2021   Raynaud's disease, idiopathic 07/08/2019   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HCC) 03/06/2019   Near syncope 02/12/2019   Microalbuminuria due to type 2 diabetes mellitus (HCC) 02/12/2019   Guyon syndrome, 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   Cubital tunnel syndrome on 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   Flexor carpi ulnaris tenosynovitis 08/30/2018   Patellar subluxation, left, initial encounter 09/23/2015   Plantar fasciitis, bilateral 01/08/2014   Reaction, situational, acute, to stress 10/16/2013   Lumbar pars defect 10/15/2012   Idiopathic scoliosis 10/11/2012   Essential hypertension, benign 12/27/2011   Hyperlipidemia 12/27/2011   Diabetes type 2, controlled (HCC) 12/27/2011   POLYCYSTIC OVARIAN DISEASE 05/06/2008    PCP: Alvan Dorothyann BIRCH, MD  REFERRING PROVIDER: Genelle Standing, MD  REFERRING DIAG: [phone] (ICD-10-CM) - Patellar instability of left knee; MPFL and MACI  implant DOS 09/12/23  THERAPY DIAG:  Chronic pain of left knee  Muscle weakness (generalized)  Difficulty in walking, not elsewhere classified  Rationale for Evaluation and Treatment: Rehabilitation  ONSET DATE: DOS 09/12/23  SUBJECTIVE:   SUBJECTIVE STATEMENT: Pt is 8 weeks s/p today. She denies pain at entry. Muscle soreness after last session, but no knee pain.   PERTINENT HISTORY: HTN, HLD, DM, idiopathic scoliosis PAIN:  Are you having pain? No: NPRS scale: 0/10 Pain location: L knee Pain description: aching, pulling Aggravating factors: weightbearing Relieving factors: rest  PRECAUTIONS: Knee  WEIGHT BEARING RESTRICTIONS: WBAT  FALLS:  Has patient fallen in last 6 months? No   OCCUPATION: Supervisor at [employer]  PLOF: Independent  PATIENT GOALS: to walk right and without pain  OBJECTIVE: (objective measures from initial evaluation unless otherwise dated)   PATIENT SURVEYS: LEFS 23/80  COGNITION: Overall cognitive status: Within functional limits for tasks assessed     SENSATION:WFL  EDEMA:  gross edema L knee  POSTURE: No Significant postural limitations  PALPATION: grossly TTP L knee  LOWER EXTREMITY ROM:  Active ROM Right eval Left eval  Hip flexion    Hip extension    Hip abduction    Hip adduction    Hip internal rotation    Hip external rotation    Knee flexion    Knee extension  0  Ankle dorsiflexion  53  Ankle plantarflexion    Ankle inversion    Ankle eversion     (Blank rows = not tested) *= pain/symptoms  LOWER EXTREMITY MMT: NT due to post op status; good quad set on L, not able to perform SLR  MMT Right eval Left eval  Hip flexion    Hip extension    Hip abduction    Hip adduction    Hip internal rotation    Hip external rotation    Knee flexion    Knee extension    Ankle dorsiflexion    Ankle plantarflexion    Ankle inversion    Ankle eversion     (Blank rows = not tested) *= pain/symptoms   GAIT: Distance walked: 50 feet Assistive device utilized: None Level of assistance: Modified independence Comments: LLE in brace, locked in extension   TODAY'S TREATMENT:                                                                                                                              DATE:   7/1  Nu step lvl 5 6 min STS from low table 4x8  Bridge on ball 3x10 (knee bent) 3sec hold 4 lateral step down 3x10 single UE support Grey leg press 40lbs 3x10 Knee ext machine 3x8 20lbs DL  SL balance on airex 69d 4x SL heel raise 2x20   6/23  Nu step lvl 6 6 min  STS from low table 4x8  Bridge on ball 3x10 (knee bent), 4 lateral step down 3x10 single UE support Grey leg press 40lbs 4x8 Knee ext machine 4x8 20lbs DL (29/69 left right)  SL balance on airex 30s 4x    6/13  Sci fit bike half revs 6 min Supine SLR 3x10 PROM L knee Bridges 3x10 Sidelying hip abduction 3x10 Shuttle DL leg press 3s holds at bottom 2x10 50# Elevated table STS 3x10  SL calf raise 2x15 SLS 30s LAQ 5 3x10 3#  6/10  Upright bike half  revs 6 min  Shuttle DL leg press 3s holds at bottom 3x10 High table STS 3x10 (progressively drop height for stretch) SL calf raise 3x12 SLS 30s 3x Calf stretch with quad set 3s 2x10 SAQ with focus on hyper ext 3lbs 2x20   6/6  Recumbent bike half revs 7 min Flexion to  90 Propped quad set with 2.5 weight overpressure 3x10 2s  Wall slide flexion 10s 15x LAQ 2lbs 3x12 Bridge 4x8  Patellar mobs grade III in all 4 directions  Skin rolling and xfriction scar massage 6/3  Recumbent bike half revs 6.5 min Flexion 75; ext  Propped quad set with 2.5 weight overpressure 3x10 2s  SAQ 2.5lbs 3x10 LAQ 2x10 Knee ext isometric 5s 2x10 SLS 30s 4x  Tall table STS 2x10   5/23  Patellar mobs inf and sup grade III  PROM flexion to tolerance (55 deg); ext to -3 Calf stretch with quad set 2x10 3s SLR 10x with quad set focus (no extensor lag)  Taiglate stretch 5x 10x ( up to 71 deg)   Gait: single crutch to reduce antalgic pattern; walking safety  HEP updates, ROM and quad firing focus    Eval:  Eval, dressing change, HEP, Education    PATIENT EDUCATION:  Education details: surgical precautions and goals, anatomy, exercise progression, DOMS expectations, muscle firing,  envelope of function, HEP, POC  Person educated: Patient Education method: Explanation, Demonstration, and Handouts Education comprehension: verbalized understanding, returned demonstration, verbal cues required, and tactile cues required  HOME EXERCISE PROGRAM: Access Code: [email or web address] (electronic)  URL: [email or web address] Date: 09/16/2023 Prepared by: Prentice Zaunegger    ASSESSMENT:  CLINICAL IMPRESSION: Pt is now 8 weeks s/p. She remains limited into knee flexion, which we continued to work on in clinic. She reported mild popping in knee with passive knee flexion, though denied any pain. She tolerated CKC activities well and performed with good form. Will continue to progress stability,  strength, and ROM as tolerated.   OBJECTIVE IMPAIRMENTS: Abnormal gait, decreased activity tolerance, decreased balance, decreased endurance, decreased mobility, difficulty walking, decreased ROM, decreased strength, impaired flexibility, improper body mechanics, and pain.   ACTIVITY LIMITATIONS: carrying, lifting, bending, standing, squatting, sleeping, stairs, transfers, bathing, hygiene/grooming, locomotion level, and caring for others  PARTICIPATION LIMITATIONS: meal prep, cleaning, laundry, shopping, community activity, occupation, and yard work  PERSONAL FACTORS: Fitness, Time since onset of injury/illness/exacerbation, and 3+ comorbidities: HTN, HLD, DM, idiopathic scoliosis are also affecting patient's functional outcome.   REHAB POTENTIAL: Good  CLINICAL DECISION MAKING: Stable/uncomplicated  EVALUATION COMPLEXITY: Low   GOALS: Goals reviewed with patient? Yes  SHORT TERM GOALS: Target date: 10/14/2023    Patient will be independent with HEP in order to improve functional outcomes. Baseline: Goal status: INITIAL  2.  Patient will report at least 25% improvement in symptoms for improved quality of life. Baseline: Goal status: INITIAL  3.  Patient will perform SLR without lag. Baseline:  Goal status: INITIAL    LONG TERM GOALS: Target date: 12/09/2023    Patient will report at least 75% improvement in symptoms for improved quality of life. Baseline:  Goal status: INITIAL  2.  Patient will improve LEFS score by at least 18 points in order to indicate improved tolerance to activity. Baseline:  Goal status: INITIAL  3.  Patient will be able to squat without compensation in order to demonstrate improved LE strength. Baseline:  Goal status: INITIAL  4.  Patient will demonstrate full left knee ROM for improved ability to squat and navigate stairs. Baseline:  Goal status: INITIAL  5.  Patient will be able to perform forward step down test without deviation in order  to demonstrate improved LE strength and motor control.  Baseline:  Goal status: INITIAL  6.  Patient will demonstrate grade of 5/5 MMT grade in all tested musculature as evidence  of improved strength to assist with stair ambulation and gait.   Baseline:  Goal status: INITIAL   PLAN:  PT FREQUENCY: 1-2x/week  PT DURATION: 12 weeks  PLANNED INTERVENTIONS: 97164- PT Re-evaluation, 97110-Therapeutic exercises, 97530- Therapeutic activity, 97112- Neuromuscular re-education, 97535- Self Care, 02859- Manual therapy, [phone]- Gait training, [phone]- Orthotic Fit/training, [phone]- Canalith repositioning, [id number]- Aquatic Therapy, [phone]- Splinting, Patient/Family education, Balance training, Stair training, Taping, Dry Needling, Joint mobilization, Joint manipulation, Spinal manipulation, Spinal mobilization, Scar mobilization, and DME instructions.  PLAN FOR NEXT SESSION: per op note follow the MPFL reconstruction protocol   Asberry BRAVO Adelia Baptista, PTA 11/07/2023, 8:56 AM

## 2023-11-14 ENCOUNTER — Ambulatory Visit (HOSPITAL_BASED_OUTPATIENT_CLINIC_OR_DEPARTMENT_OTHER): Admitting: Physical Therapy

## 2023-11-16 ENCOUNTER — Encounter (HOSPITAL_BASED_OUTPATIENT_CLINIC_OR_DEPARTMENT_OTHER): Payer: Self-pay

## 2023-11-16 ENCOUNTER — Ambulatory Visit (HOSPITAL_BASED_OUTPATIENT_CLINIC_OR_DEPARTMENT_OTHER)

## 2023-11-16 DIAGNOSIS — R262 Difficulty in walking, not elsewhere classified: Secondary | ICD-10-CM | POA: Diagnosis present

## 2023-11-16 DIAGNOSIS — M25562 Pain in left knee: Secondary | ICD-10-CM | POA: Diagnosis present

## 2023-11-16 DIAGNOSIS — M6281 Muscle weakness (generalized): Secondary | ICD-10-CM | POA: Diagnosis present

## 2023-11-16 DIAGNOSIS — G8929 Other chronic pain: Secondary | ICD-10-CM

## 2023-11-16 NOTE — Therapy (Signed)
 OUTPATIENT PHYSICAL THERAPY LOWER EXTREMITY TREATMENT   Patient Name: Megan [name] MRN: [id number] DOB:26-Jan-1971, 53 y.o., female Today's Date: 11/16/2023  END OF SESSION:  PT End of Session - 11/16/23 0829     Visit Number 9    Number of Visits 24    Date for PT Re-Evaluation 12/09/23    Authorization Type Aet[employer]    PT Start Time 0803    PT Stop Time 0845    PT Time Calculation (min) 42 min    Activity Tolerance Patient tolerated treatment well    Behavior During Therapy [facility] for tasks assessed/performed                  Past Medical History:  Diagnosis Date   Gestational diabetes mellitus in childbirth    Hypertension    Past Surgical History:  Procedure Laterality Date   IMPLANTATION, MATRIX-INDUCED AUTOLOGOUS CHONDROCYTES Left 09/12/2023   Procedure: IMPLANTATION, MATRIX-INDUCED AUTOLOGOUS CHONDROCYTES;  Surgeon: Genelle Standing, MD;  Location: [facility];  Service: Orthopedics;  Laterality: Left;   KNEE ARTHROSCOPY WITH MACI  CARTILAGE HARVEST Left 07/24/2023   Procedure: ARTHROSCOPY, KNEE, WITH CARTILAGE PROCUREMENT FOR CULTURE;  Surgeon: Genelle Standing, MD;  Location: [facility];  Service: Orthopedics;  Laterality: Left;  LEFT KNEE ARTHROSCOPY WITH MACI  HARVEST   MEDIAL PATELLOFEMORAL LIGAMENT REPAIR Left 09/12/2023   Procedure: RECONSTRUCTION, LIGAMENT, MEDIAL PATELLOFEMORAL;  Surgeon: Genelle Standing, MD;  Location: [facility];  Service: Orthopedics;  Laterality: Left;  LEFT KNEE MEDIAL PATELLOFEMORAL LIGAMENT RECONSTRUCTION ANDMATRIX ASSOCIATED CHONDROCYTES IMPLANTATION (PATELLA AND FEMUR)   TONSILLECTOMY AND ADENOIDECTOMY     TYMPANOSTOMY TUBE PLACEMENT  1984   Patient Active Problem List   Diagnosis Date Noted   Patellar instability of left knee 07/24/2023   High serum copper  level 03/01/2023   Vitamin B12 deficiency 10/31/2022   Encounter for weight management 10/31/2022   Left lumbar radiculitis 07/13/2022    Right hand pain 10/28/2021   Raynaud's disease, idiopathic 07/08/2019   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HCC) 03/06/2019   Near syncope 02/12/2019   Microalbuminuria due to type 2 diabetes mellitus (HCC) 02/12/2019   Guyon syndrome, 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   Cubital tunnel syndrome on 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   Flexor carpi ulnaris tenosynovitis 08/30/2018   Patellar subluxation, left, initial encounter 09/23/2015   Plantar fasciitis, bilateral 01/08/2014   Reaction, situational, acute, to stress 10/16/2013   Lumbar pars defect 10/15/2012   Idiopathic scoliosis 10/11/2012   Essential hypertension, benign 12/27/2011   Hyperlipidemia 12/27/2011   Diabetes type 2, controlled (HCC) 12/27/2011   POLYCYSTIC OVARIAN DISEASE 05/06/2008    PCP: Alvan Dorothyann BIRCH, MD  REFERRING PROVIDER: Genelle Standing, MD  REFERRING DIAG: [phone] (ICD-10-CM) - Patellar instability of left knee; MPFL and MACI  implant DOS 09/12/23  THERAPY DIAG:  Chronic pain of left knee  Muscle weakness (generalized)  Difficulty in walking, not elsewhere classified  Rationale for Evaluation and Treatment: Rehabilitation  ONSET DATE: DOS 09/12/23  SUBJECTIVE:   SUBJECTIVE STATEMENT: Pt reports her dog pulled her when she was walking her and caused her to lose her balance. She was able to keep from falling. Knee bothered her a little after this, but seems to be okay. No pain at entry.   PERTINENT HISTORY: HTN, HLD, DM, idiopathic scoliosis PAIN:  Are you having pain? No: NPRS scale: 0/10 Pain location: L knee Pain description: aching, pulling Aggravating factors: weightbearing Relieving factors: rest  PRECAUTIONS: Knee  WEIGHT BEARING RESTRICTIONS: WBAT  FALLS:  Has patient fallen in last  6 months? No   OCCUPATION: Supervisor at [employer]  PLOF: Independent  PATIENT GOALS: to walk right and without pain  OBJECTIVE: (objective measures from initial evaluation unless otherwise dated)   PATIENT SURVEYS: LEFS  23/80  COGNITION: Overall cognitive status: Within functional limits for tasks assessed     SENSATION:WFL  EDEMA: gross edema L knee  POSTURE: No Significant postural limitations  PALPATION: grossly TTP L knee  LOWER EXTREMITY ROM:  Active ROM Right eval Left eval  Hip flexion    Hip extension    Hip abduction    Hip adduction    Hip internal rotation    Hip external rotation    Knee flexion    Knee extension  0  Ankle dorsiflexion  53  Ankle plantarflexion    Ankle inversion    Ankle eversion     (Blank rows = not tested) *= pain/symptoms  LOWER EXTREMITY MMT: NT due to post op status; good quad set on L, not able to perform SLR  MMT Right eval Left eval  Hip flexion    Hip extension    Hip abduction    Hip adduction    Hip internal rotation    Hip external rotation    Knee flexion    Knee extension    Ankle dorsiflexion    Ankle plantarflexion    Ankle inversion    Ankle eversion     (Blank rows = not tested) *= pain/symptoms   GAIT: Distance walked: 50 feet Assistive device utilized: None Level of assistance: Modified independence Comments: LLE in brace, locked in extension   TODAY'S TREATMENT:                                                                                                                              DATE:   7/10  Recumbent bike L3 x41min PROM L knee STM to medial HS  STS from low table 2x10 staggered ( L posterior) Bridge on ball 3x10 (knee bent) 3sec hold 4 fwd eccentric step down 2x10 Grey leg press 40lbs 3x10 Knee ext machine 3x8 20lbs DL  SL balance on airex 69d 4x SL heel raise 2x15 Runner step up 8 2x10   7/1  Nu step lvl 5 6 min STS from low table 4x8  Bridge on ball 3x10 (knee bent) 3sec hold 4 lateral step down 3x10 single UE support Grey leg press 40lbs 3x10 Knee ext machine 3x8 20lbs DL  SL balance on airex 69d 4x SL heel raise 2x20   6/23  Nu step lvl 6 6 min  STS from low table 4x8   Bridge on ball 3x10 (knee bent), 4 lateral step down 3x10 single UE support Grey leg press 40lbs 4x8 Knee ext machine 4x8 20lbs DL (29/69 left right)  SL balance on airex 30s 4x    6/13  Sci fit bike half revs 6 min Supine SLR 3x10 PROM L knee Bridges 3x10 Sidelying hip abduction  3x10 Shuttle DL leg press 3s holds at bottom 2x10 50# Elevated table STS 3x10  SL calf raise 2x15 SLS 30s LAQ 5 3x10 3#  6/10  Upright bike half revs 6 min  Shuttle DL leg press 3s holds at bottom 3x10 High table STS 3x10 (progressively drop height for stretch) SL calf raise 3x12 SLS 30s 3x Calf stretch with quad set 3s 2x10 SAQ with focus on hyper ext 3lbs 2x20   6/6  Recumbent bike half revs 7 min Flexion to 90 Propped quad set with 2.5 weight overpressure 3x10 2s  Wall slide flexion 10s 15x LAQ 2lbs 3x12 Bridge 4x8  Patellar mobs grade III in all 4 directions  Skin rolling and xfriction scar massage  PATIENT EDUCATION:  Education details: surgical precautions and goals, anatomy, exercise progression, DOMS expectations, muscle firing,  envelope of function, HEP, POC  Person educated: Patient Education method: Explanation, Demonstration, and Handouts Education comprehension: verbalized understanding, returned demonstration, verbal cues required, and tactile cues required  HOME EXERCISE PROGRAM: Access Code: [email or web address] (electronic)  URL: [email or web address] Date: 09/16/2023 Prepared by: Prentice Zaunegger    ASSESSMENT:  CLINICAL IMPRESSION: Pt is now 9 weeks s/p. Able ot progress with therex, NMR and therAct interventions today with good tolerance. She was challenged by eccentric fwd step downs, though denied significant discomfort with this. Remains tight into passive knee flexion which she reports medial knee tightness with. Pt with benefit from addition PT to improve knee ROM, strength, and stability.   OBJECTIVE IMPAIRMENTS: Abnormal gait, decreased  activity tolerance, decreased balance, decreased endurance, decreased mobility, difficulty walking, decreased ROM, decreased strength, impaired flexibility, improper body mechanics, and pain.   ACTIVITY LIMITATIONS: carrying, lifting, bending, standing, squatting, sleeping, stairs, transfers, bathing, hygiene/grooming, locomotion level, and caring for others  PARTICIPATION LIMITATIONS: meal prep, cleaning, laundry, shopping, community activity, occupation, and yard work  PERSONAL FACTORS: Fitness, Time since onset of injury/illness/exacerbation, and 3+ comorbidities: HTN, HLD, DM, idiopathic scoliosis are also affecting patient's functional outcome.   REHAB POTENTIAL: Good  CLINICAL DECISION MAKING: Stable/uncomplicated  EVALUATION COMPLEXITY: Low   GOALS: Goals reviewed with patient? Yes  SHORT TERM GOALS: Target date: 10/14/2023    Patient will be independent with HEP in order to improve functional outcomes. Baseline: Goal status: INITIAL  2.  Patient will report at least 25% improvement in symptoms for improved quality of life. Baseline: Goal status: INITIAL  3.  Patient will perform SLR without lag. Baseline:  Goal status: INITIAL    LONG TERM GOALS: Target date: 12/09/2023    Patient will report at least 75% improvement in symptoms for improved quality of life. Baseline:  Goal status: INITIAL  2.  Patient will improve LEFS score by at least 18 points in order to indicate improved tolerance to activity. Baseline:  Goal status: INITIAL  3.  Patient will be able to squat without compensation in order to demonstrate improved LE strength. Baseline:  Goal status: INITIAL  4.  Patient will demonstrate full left knee ROM for improved ability to squat and navigate stairs. Baseline:  Goal status: INITIAL  5.  Patient will be able to perform forward step down test without deviation in order to demonstrate improved LE strength and motor control.  Baseline:  Goal status:  INITIAL  6.  Patient will demonstrate grade of 5/5 MMT grade in all tested musculature as evidence of improved strength to assist with stair ambulation and gait.   Baseline:  Goal status: INITIAL   PLAN:  PT FREQUENCY: 1-2x/week  PT DURATION: 12 weeks  PLANNED INTERVENTIONS: 97164- PT Re-evaluation, 97110-Therapeutic exercises, 97530- Therapeutic activity, 97112- Neuromuscular re-education, [phone]- Self Care, 02859- Manual therapy, [phone]- Gait training, [phone]- Orthotic Fit/training, [phone]- Canalith repositioning, [id number]- Aquatic Therapy, [phone]- Splinting, Patient/Family education, Balance training, Stair training, Taping, Dry Needling, Joint mobilization, Joint manipulation, Spinal manipulation, Spinal mobilization, Scar mobilization, and DME instructions.  PLAN FOR NEXT SESSION: per op note follow the MPFL reconstruction protocol   [place] BRAVO Keaton Stirewalt, PTA 11/16/2023, 9:22 AM

## 2023-11-21 ENCOUNTER — Ambulatory Visit (HOSPITAL_BASED_OUTPATIENT_CLINIC_OR_DEPARTMENT_OTHER): Admitting: Physical Therapy

## 2023-11-21 ENCOUNTER — Encounter (HOSPITAL_BASED_OUTPATIENT_CLINIC_OR_DEPARTMENT_OTHER): Payer: Self-pay | Admitting: Physical Therapy

## 2023-11-21 DIAGNOSIS — G8929 Other chronic pain: Secondary | ICD-10-CM

## 2023-11-21 DIAGNOSIS — M25562 Pain in left knee: Secondary | ICD-10-CM | POA: Diagnosis not present

## 2023-11-21 DIAGNOSIS — M6281 Muscle weakness (generalized): Secondary | ICD-10-CM

## 2023-11-21 DIAGNOSIS — R262 Difficulty in walking, not elsewhere classified: Secondary | ICD-10-CM

## 2023-11-21 NOTE — Therapy (Signed)
 OUTPATIENT PHYSICAL THERAPY LOWER EXTREMITY TREATMENT   Patient Name: Megan [name] MRN: [id number] DOB:November 02, 1970, 53 y.o., female Today's Date: 11/21/2023  END OF SESSION:  PT End of Session - 11/21/23 0814     Visit Number 10    Number of Visits 24    Date for PT Re-Evaluation 12/09/23    Authorization Type Aet[employer]    PT Start Time 0815    PT Stop Time 0844    PT Time Calculation (min) 29 min    Activity Tolerance Patient tolerated treatment well    Behavior During Therapy [facility] for tasks assessed/performed                  Past Medical History:  Diagnosis Date   Gestational diabetes mellitus in childbirth    Hypertension    Past Surgical History:  Procedure Laterality Date   IMPLANTATION, MATRIX-INDUCED AUTOLOGOUS CHONDROCYTES Left 09/12/2023   Procedure: IMPLANTATION, MATRIX-INDUCED AUTOLOGOUS CHONDROCYTES;  Surgeon: Genelle Standing, MD;  Location: [facility];  Service: Orthopedics;  Laterality: Left;   KNEE ARTHROSCOPY WITH MACI  CARTILAGE HARVEST Left 07/24/2023   Procedure: ARTHROSCOPY, KNEE, WITH CARTILAGE PROCUREMENT FOR CULTURE;  Surgeon: Genelle Standing, MD;  Location: [facility];  Service: Orthopedics;  Laterality: Left;  LEFT KNEE ARTHROSCOPY WITH MACI  HARVEST   MEDIAL PATELLOFEMORAL LIGAMENT REPAIR Left 09/12/2023   Procedure: RECONSTRUCTION, LIGAMENT, MEDIAL PATELLOFEMORAL;  Surgeon: Genelle Standing, MD;  Location: [facility];  Service: Orthopedics;  Laterality: Left;  LEFT KNEE MEDIAL PATELLOFEMORAL LIGAMENT RECONSTRUCTION ANDMATRIX ASSOCIATED CHONDROCYTES IMPLANTATION (PATELLA AND FEMUR)   TONSILLECTOMY AND ADENOIDECTOMY     TYMPANOSTOMY TUBE PLACEMENT  1984   Patient Active Problem List   Diagnosis Date Noted   Patellar instability of left knee 07/24/2023   High serum copper  level 03/01/2023   Vitamin B12 deficiency 10/31/2022   Encounter for weight management 10/31/2022   Left lumbar radiculitis 07/13/2022    Right hand pain 10/28/2021   Raynaud's disease, idiopathic 07/08/2019   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HCC) 03/06/2019   Near syncope 02/12/2019   Microalbuminuria due to type 2 diabetes mellitus (HCC) 02/12/2019   Guyon syndrome, 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   Cubital tunnel syndrome on 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   Flexor carpi ulnaris tenosynovitis 08/30/2018   Patellar subluxation, left, initial encounter 09/23/2015   Plantar fasciitis, bilateral 01/08/2014   Reaction, situational, acute, to stress 10/16/2013   Lumbar pars defect 10/15/2012   Idiopathic scoliosis 10/11/2012   Essential hypertension, benign 12/27/2011   Hyperlipidemia 12/27/2011   Diabetes type 2, controlled (HCC) 12/27/2011   POLYCYSTIC OVARIAN DISEASE 05/06/2008    PCP: Alvan Dorothyann BIRCH, MD  REFERRING PROVIDER: Genelle Standing, MD  REFERRING DIAG: [phone] (ICD-10-CM) - Patellar instability of left knee; MPFL and MACI  implant DOS 09/12/23  THERAPY DIAG:  Chronic pain of left knee  Muscle weakness (generalized)  Difficulty in walking, not elsewhere classified  Rationale for Evaluation and Treatment: Rehabilitation  ONSET DATE: DOS 09/12/23  SUBJECTIVE:   SUBJECTIVE STATEMENT: Pt feels soreness into the anterior, lateral part of the knee. Pt has been walking down the stairs normally and describes it as a pulling sensation. Pt notes that bending still feels tight and will not let her progress past a certain point.   PERTINENT HISTORY: HTN, HLD, DM, idiopathic scoliosis PAIN:  Are you having pain? No: NPRS scale: 0/10 Pain location: L knee Pain description: aching, pulling Aggravating factors: weightbearing Relieving factors: rest  PRECAUTIONS: Knee  WEIGHT BEARING RESTRICTIONS: WBAT  FALLS:  Has patient fallen  in last 6 months? No   OCCUPATION: Supervisor at [employer]  PLOF: Independent  PATIENT GOALS: to walk right and without pain  OBJECTIVE: (objective measures from initial evaluation unless otherwise  dated)   PATIENT SURVEYS: LEFS 23/80  7/15    Extreme difficulty/unable (0), Quite a bit of difficulty (1), Moderate difficulty (2), Little difficulty (3), No difficulty (4) Survey date:  7/15  Any of your usual work, housework or school activities 3  2. Usual hobbies, recreational or sporting activities 3  3. Getting into/out of the bath 4  4. Walking between rooms 4  5. Putting on socks/shoes 3  6. Squatting  2  7. Lifting an object, like a bag of groceries from the floor 4  8. Performing light activities around your home 3  9. Performing heavy activities around your home 3  10. Getting into/out of a car 4  11. Walking 2 blocks 4  12. Walking 1 mile 4  13. Going up/down 10 stairs (1 flight) 2  14. Standing for 1 hour 4  15.  sitting for 1 hour 4  16. Running on even ground 3  17. Running on uneven ground 2  18. Making sharp turns while running fast 1  19. Hopping  2  20. Rolling over in bed 4  Score total:  63/80     COGNITION: Overall cognitive status: Within functional limits for tasks assessed     SENSATION:WFL  EDEMA: gross edema L knee  POSTURE: No Significant postural limitations  PALPATION: grossly TTP L knee  LOWER EXTREMITY ROM:  Active ROM Right eval Left eval  Hip flexion    Hip extension    Hip abduction    Hip adduction    Hip internal rotation    Hip external rotation    Knee flexion  110  Knee extension  3  Ankle dorsiflexion    Ankle plantarflexion    Ankle inversion    Ankle eversion     (Blank rows = not tested) *= pain/symptoms  LOWER EXTREMITY MMT:   MMT Right eval Left eval L 7/15  Hip flexion     Hip extension     Hip abduction     Hip adduction     Hip internal rotation     Hip external rotation     Knee flexion   4+/5  Knee extension   4+/5  Ankle dorsiflexion     Ankle plantarflexion     Ankle inversion     Ankle eversion      (Blank rows = not tested) *= pain/symptoms   GAIT: Distance walked: 50  feet WNL   TODAY'S TREATMENT:                                                                                                                              DATE:   7/15  Recumbent bike L3 x9min Patellar sup and inf mobs grade IV L knee flexion  mob with tibial IR and ER grade IV  Prone quad stretch 30s 4x Support lunge 2x10 Stair step flexion stretch for home  Self scar and mobs for ROM, HEP updates  7/10  Recumbent bike L3 x101min PROM L knee STM to medial HS  STS from low table 2x10 staggered ( L posterior) Bridge on ball 3x10 (knee bent) 3sec hold 4 fwd eccentric step down 2x10 Grey leg press 40lbs 3x10 Knee ext machine 3x8 20lbs DL  SL balance on airex 69d 4x SL heel raise 2x15 Runner step up 8 2x10   7/1  Nu step lvl 5 6 min STS from low table 4x8  Bridge on ball 3x10 (knee bent) 3sec hold 4 lateral step down 3x10 single UE support Grey leg press 40lbs 3x10 Knee ext machine 3x8 20lbs DL  SL balance on airex 69d 4x SL heel raise 2x20   6/23  Nu step lvl 6 6 min  STS from low table 4x8  Bridge on ball 3x10 (knee bent), 4 lateral step down 3x10 single UE support Grey leg press 40lbs 4x8 Knee ext machine 4x8 20lbs DL (29/69 left right)  SL balance on airex 30s 4x    6/13  Sci fit bike half revs 6 min Supine SLR 3x10 PROM L knee Bridges 3x10 Sidelying hip abduction 3x10 Shuttle DL leg press 3s holds at bottom 2x10 50# Elevated table STS 3x10  SL calf raise 2x15 SLS 30s LAQ 5 3x10 3#  6/10  Upright bike half revs 6 min  Shuttle DL leg press 3s holds at bottom 3x10 High table STS 3x10 (progressively drop height for stretch) SL calf raise 3x12 SLS 30s 3x Calf stretch with quad set 3s 2x10 SAQ with focus on hyper ext 3lbs 2x20   6/6  Recumbent bike half revs 7 min Flexion to 90 Propped quad set with 2.5 weight overpressure 3x10 2s  Wall slide flexion 10s 15x LAQ 2lbs 3x12 Bridge 4x8  Patellar mobs grade III in all 4 directions   Skin rolling and xfriction scar massage  PATIENT EDUCATION:  Education details: surgical precautions and goals, anatomy, exercise progression, DOMS expectations, muscle firing,  envelope of function, HEP, POC  Person educated: Patient Education method: Explanation, Demonstration, and Handouts Education comprehension: verbalized understanding, returned demonstration, verbal cues required, and tactile cues required  HOME EXERCISE PROGRAM: Access Code: [email or web address] (electronic)  URL: [email or web address] Date: 09/16/2023 Prepared by: Prentice Zaunegger    ASSESSMENT:  CLINICAL IMPRESSION: Pt is now 9 weeks s/p. Pt with limited knee flexion but full knee ext. Pt advised on self mobilization techniques to improve knee flexion. HEP updated today for progressive loaded stretching as well L LE strength. Plan progress with SL stability and update HEP with progressive strengthening. Pt is progressing well with therapy and has met STG. LTG are ongoing at this time. Pt with benefit from addition PT to improve knee ROM, strength, and stability.   OBJECTIVE IMPAIRMENTS: Abnormal gait, decreased activity tolerance, decreased balance, decreased endurance, decreased mobility, difficulty walking, decreased ROM, decreased strength, impaired flexibility, improper body mechanics, and pain.   ACTIVITY LIMITATIONS: carrying, lifting, bending, standing, squatting, sleeping, stairs, transfers, bathing, hygiene/grooming, locomotion level, and caring for others  PARTICIPATION LIMITATIONS: meal prep, cleaning, laundry, shopping, community activity, occupation, and yard work  PERSONAL FACTORS: Fitness, Time since onset of injury/illness/exacerbation, and 3+ comorbidities: HTN, HLD, DM, idiopathic scoliosis are also affecting patient's functional outcome.   REHAB POTENTIAL: Good  CLINICAL DECISION MAKING: Stable/uncomplicated  EVALUATION COMPLEXITY: Low  GOALS: Goals reviewed with patient?  Yes  SHORT TERM GOALS: Target date: 10/14/2023    Patient will be independent with HEP in order to improve functional outcomes. Baseline: Goal status: MET  2.  Patient will report at least 25% improvement in symptoms for improved quality of life. Baseline: Goal status: MET  3.  Patient will perform SLR without lag. Baseline:  Goal status: MET    LONG TERM GOALS: Target date: 12/09/2023    Patient will report at least 75% improvement in symptoms for improved quality of life. Baseline:  Goal status: ongoing  2.  Patient will improve LEFS score by at least 18 points in order to indicate improved tolerance to activity. Baseline:  Goal status: ongoing  3.  Patient will be able to squat without compensation in order to demonstrate improved LE strength. Baseline:  Goal status: ongoing  4.  Patient will demonstrate full left knee ROM for improved ability to squat and navigate stairs. Baseline:  Goal status: ongoing  5.  Patient will be able to perform forward step down test without deviation in order to demonstrate improved LE strength and motor control.  Baseline:  Goal status: ongoing  6.  Patient will demonstrate grade of 5/5 MMT grade in all tested musculature as evidence of improved strength to assist with stair ambulation and gait.   Baseline:  Goal status: ongoing   PLAN:  PT FREQUENCY: 1-2x/week  PT DURATION: 12 weeks  PLANNED INTERVENTIONS: 97164- PT Re-evaluation, 97110-Therapeutic exercises, 97530- Therapeutic activity, 97112- Neuromuscular re-education, 97535- Self Care, 02859- Manual therapy, [phone]- Gait training, [phone]- Orthotic Fit/training, [phone]- Canalith repositioning, [id number]- Aquatic Therapy, [phone]- Splinting, Patient/Family education, Balance training, Stair training, Taping, Dry Needling, Joint mobilization, Joint manipulation, Spinal manipulation, Spinal mobilization, Scar mobilization, and DME instructions.  PLAN FOR NEXT SESSION: per op note follow  the MPFL reconstruction protocol; progress depth of squat, progress lunging and SL stance patterns   Dale Call, PT 11/21/2023, 8:48 AM

## 2023-11-23 ENCOUNTER — Encounter (HOSPITAL_BASED_OUTPATIENT_CLINIC_OR_DEPARTMENT_OTHER): Payer: Self-pay

## 2023-11-23 ENCOUNTER — Ambulatory Visit (HOSPITAL_BASED_OUTPATIENT_CLINIC_OR_DEPARTMENT_OTHER)

## 2023-11-23 DIAGNOSIS — M25562 Pain in left knee: Secondary | ICD-10-CM | POA: Diagnosis not present

## 2023-11-23 DIAGNOSIS — M6281 Muscle weakness (generalized): Secondary | ICD-10-CM

## 2023-11-23 DIAGNOSIS — G8929 Other chronic pain: Secondary | ICD-10-CM

## 2023-11-23 DIAGNOSIS — R262 Difficulty in walking, not elsewhere classified: Secondary | ICD-10-CM

## 2023-11-23 NOTE — Therapy (Signed)
 OUTPATIENT PHYSICAL THERAPY LOWER EXTREMITY TREATMENT   Patient Name: Megan [name] MRN: [id number] DOB:[birth year], 53 y.o., female Today's Date: 11/23/2023  END OF SESSION:  PT End of Session - 11/23/23 0806     Visit Number 11    Number of Visits 24    Date for PT Re-Evaluation 12/09/23    Authorization Type Aet[employer]    PT Start Time 0802    PT Stop Time 0846    PT Time Calculation (min) 44 min    Activity Tolerance Patient tolerated treatment well    Behavior During Therapy [facility] for tasks assessed/performed                   Past Medical History:  Diagnosis Date   Gestational diabetes mellitus in childbirth    Hypertension    Past Surgical History:  Procedure Laterality Date   IMPLANTATION, MATRIX-INDUCED AUTOLOGOUS CHONDROCYTES Left 09/12/2023   Procedure: IMPLANTATION, MATRIX-INDUCED AUTOLOGOUS CHONDROCYTES;  Surgeon: Genelle Standing, MD;  Location: [facility];  Service: Orthopedics;  Laterality: Left;   KNEE ARTHROSCOPY WITH MACI  CARTILAGE HARVEST Left 07/24/2023   Procedure: ARTHROSCOPY, KNEE, WITH CARTILAGE PROCUREMENT FOR CULTURE;  Surgeon: Genelle Standing, MD;  Location: [facility];  Service: Orthopedics;  Laterality: Left;  LEFT KNEE ARTHROSCOPY WITH MACI  HARVEST   MEDIAL PATELLOFEMORAL LIGAMENT REPAIR Left 09/12/2023   Procedure: RECONSTRUCTION, LIGAMENT, MEDIAL PATELLOFEMORAL;  Surgeon: Genelle Standing, MD;  Location: [facility];  Service: Orthopedics;  Laterality: Left;  LEFT KNEE MEDIAL PATELLOFEMORAL LIGAMENT RECONSTRUCTION ANDMATRIX ASSOCIATED CHONDROCYTES IMPLANTATION (PATELLA AND FEMUR)   TONSILLECTOMY AND ADENOIDECTOMY     TYMPANOSTOMY TUBE PLACEMENT  1984   Patient Active Problem List   Diagnosis Date Noted   Patellar instability of left knee 07/24/2023   High serum copper  level 03/01/2023   Vitamin B12 deficiency 10/31/2022   Encounter for weight management 10/31/2022   Left lumbar radiculitis 07/13/2022    Right hand pain 10/28/2021   Raynaud's disease, idiopathic 07/08/2019   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HCC) 03/06/2019   Near syncope 02/12/2019   Microalbuminuria due to type 2 diabetes mellitus (HCC) 02/12/2019   Guyon syndrome, 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   Cubital tunnel syndrome on 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   Flexor carpi ulnaris tenosynovitis 08/30/2018   Patellar subluxation, left, initial encounter 09/23/2015   Plantar fasciitis, bilateral 01/08/2014   Reaction, situational, acute, to stress 10/16/2013   Lumbar pars defect 10/15/2012   Idiopathic scoliosis 10/11/2012   Essential hypertension, benign 12/27/2011   Hyperlipidemia 12/27/2011   Diabetes type 2, controlled (HCC) 12/27/2011   POLYCYSTIC OVARIAN DISEASE 05/06/2008    PCP: Alvan Dorothyann BIRCH, MD  REFERRING PROVIDER: Genelle Standing, MD  REFERRING DIAG: [phone] (ICD-10-CM) - Patellar instability of left knee; MPFL and MACI  implant DOS 09/12/23  THERAPY DIAG:  Muscle weakness (generalized)  Chronic pain of left knee  Difficulty in walking, not elsewhere classified  Rationale for Evaluation and Treatment: Rehabilitation  ONSET DATE: DOS 09/12/23  SUBJECTIVE:   SUBJECTIVE STATEMENT: Pt reports her scar feels looser' after she massages it. She had some soreness after last session, but nothing significant.   PERTINENT HISTORY: HTN, HLD, DM, idiopathic scoliosis PAIN:  Are you having pain? No: NPRS scale: 0/10 Pain location: L knee Pain description: aching, pulling Aggravating factors: weightbearing Relieving factors: rest  PRECAUTIONS: Knee  WEIGHT BEARING RESTRICTIONS: WBAT  FALLS:  Has patient fallen in last 6 months? No   OCCUPATION: Supervisor at [employer]  PLOF: Independent  PATIENT GOALS: to walk right and without  pain  OBJECTIVE: (objective measures from initial evaluation unless otherwise dated)   PATIENT SURVEYS: LEFS 23/80  7/15    Extreme difficulty/unable (0), Quite a bit of difficulty (1), Moderate  difficulty (2), Little difficulty (3), No difficulty (4) Survey date:  7/15  Any of your usual work, housework or school activities 3  2. Usual hobbies, recreational or sporting activities 3  3. Getting into/out of the bath 4  4. Walking between rooms 4  5. Putting on socks/shoes 3  6. Squatting  2  7. Lifting an object, like a bag of groceries from the floor 4  8. Performing light activities around your home 3  9. Performing heavy activities around your home 3  10. Getting into/out of a car 4  11. Walking 2 blocks 4  12. Walking 1 mile 4  13. Going up/down 10 stairs (1 flight) 2  14. Standing for 1 hour 4  15.  sitting for 1 hour 4  16. Running on even ground 3  17. Running on uneven ground 2  18. Making sharp turns while running fast 1  19. Hopping  2  20. Rolling over in bed 4  Score total:  63/80     COGNITION: Overall cognitive status: Within functional limits for tasks assessed     SENSATION:WFL  EDEMA: gross edema L knee  POSTURE: No Significant postural limitations  PALPATION: grossly TTP L knee  LOWER EXTREMITY ROM:  Active ROM Right eval Left eval L 7/17  Hip flexion     Hip extension     Hip abduction     Hip adduction     Hip internal rotation     Hip external rotation     Knee flexion  110 115  Knee extension  3 0  Ankle dorsiflexion     Ankle plantarflexion     Ankle inversion     Ankle eversion      (Blank rows = not tested) *= pain/symptoms  LOWER EXTREMITY MMT:   MMT Right eval Left eval L 7/15  Hip flexion     Hip extension     Hip abduction     Hip adduction     Hip internal rotation     Hip external rotation     Knee flexion   4+/5  Knee extension   4+/5  Ankle dorsiflexion     Ankle plantarflexion     Ankle inversion     Ankle eversion      (Blank rows = not tested) *= pain/symptoms   GAIT: Distance walked: 50 feet WNL   TODAY'S TREATMENT:                                                                                                                               DATE:   7/17  Recumbent bike L3 x47min  Patellar sup and inf mobs grade II-III L knee flexion mob with tibial IR and ER grade  II-III  Prone quad stretch 30s 4x Squats 2x10 STS from low table 2x10 staggered ( L posterior) 4 fwd eccentric step down 2x10   7/15  Recumbent bike L3 x77min Patellar sup and inf mobs grade IV L knee flexion mob with tibial IR and ER grade IV  Prone quad stretch 30s 4x Support lunge 2x10 Stair step flexion stretch for home  Self scar and mobs for ROM, HEP updates  7/10  Recumbent bike L3 x66min PROM L knee STM to medial HS  STS from low table 2x10 staggered ( L posterior) Bridge on ball 3x10 (knee bent) 3sec hold 4 fwd eccentric step down 2x10 Grey leg press 40lbs 3x10 Knee ext machine 3x8 20lbs DL  SL balance on airex 69d 4x SL heel raise 2x15 Runner step up 8 2x10   7/1  Nu step lvl 5 6 min STS from low table 4x8  Bridge on ball 3x10 (knee bent) 3sec hold 4 lateral step down 3x10 single UE support Grey leg press 40lbs 3x10 Knee ext machine 3x8 20lbs DL  SL balance on airex 69d 4x SL heel raise 2x20   6/23  Nu step lvl 6 6 min  STS from low table 4x8  Bridge on ball 3x10 (knee bent), 4 lateral step down 3x10 single UE support Grey leg press 40lbs 4x8 Knee ext machine 4x8 20lbs DL (29/69 left right)  SL balance on airex 30s 4x    6/13  Sci fit bike half revs 6 min Supine SLR 3x10 PROM L knee Bridges 3x10 Sidelying hip abduction 3x10 Shuttle DL leg press 3s holds at bottom 2x10 50# Elevated table STS 3x10  SL calf raise 2x15 SLS 30s LAQ 5 3x10 3#   PATIENT EDUCATION:  Education details: surgical precautions and goals, anatomy, exercise progression, DOMS expectations, muscle firing,  envelope of function, HEP, POC  Person educated: Patient Education method: Explanation, Demonstration, and Handouts Education comprehension: verbalized understanding, returned  demonstration, verbal cues required, and tactile cues required  HOME EXERCISE PROGRAM: Access Code: [email or web address] (electronic)  URL: [email or web address] Date: 09/16/2023 Prepared by: Prentice Zaunegger    ASSESSMENT:  CLINICAL IMPRESSION:  Continued to address ROM and mobility deficits today. Measured at 115 following mobilizations, PROM, and prone quad stretching. Instructed pt to continue working on ROM tasks at home within pain tolerance. With squats today, she does demonstrate slight off loading of L LE. No pain reported with weight bearing activities, though she did demonstrate mod difficulty with eccentric step downs from 4 step. Will continue to address ROM and strength deficits in PT.    Pt is now 9 weeks s/p. Pt with limited knee flexion but full knee ext. Pt advised on self mobilization techniques to improve knee flexion. HEP updated today for progressive loaded stretching as well L LE strength. Plan progress with SL stability and update HEP with progressive strengthening. Pt is progressing well with therapy and has met STG. LTG are ongoing at this time. Pt with benefit from addition PT to improve knee ROM, strength, and stability.   OBJECTIVE IMPAIRMENTS: Abnormal gait, decreased activity tolerance, decreased balance, decreased endurance, decreased mobility, difficulty walking, decreased ROM, decreased strength, impaired flexibility, improper body mechanics, and pain.   ACTIVITY LIMITATIONS: carrying, lifting, bending, standing, squatting, sleeping, stairs, transfers, bathing, hygiene/grooming, locomotion level, and caring for others  PARTICIPATION LIMITATIONS: meal prep, cleaning, laundry, shopping, community activity, occupation, and yard work  PERSONAL FACTORS: Fitness, Time since onset of injury/illness/exacerbation, and 3+ comorbidities: HTN, HLD, DM, idiopathic scoliosis are  also affecting patient's functional outcome.   REHAB POTENTIAL: Good  CLINICAL DECISION  MAKING: Stable/uncomplicated  EVALUATION COMPLEXITY: Low   GOALS: Goals reviewed with patient? Yes  SHORT TERM GOALS: Target date: 10/14/2023    Patient will be independent with HEP in order to improve functional outcomes. Baseline: Goal status: MET  2.  Patient will report at least 25% improvement in symptoms for improved quality of life. Baseline: Goal status: MET  3.  Patient will perform SLR without lag. Baseline:  Goal status: MET    LONG TERM GOALS: Target date: 12/09/2023    Patient will report at least 75% improvement in symptoms for improved quality of life. Baseline:  Goal status: ongoing  2.  Patient will improve LEFS score by at least 18 points in order to indicate improved tolerance to activity. Baseline:  Goal status: ongoing  3.  Patient will be able to squat without compensation in order to demonstrate improved LE strength. Baseline:  Goal status: ongoing  4.  Patient will demonstrate full left knee ROM for improved ability to squat and navigate stairs. Baseline:  Goal status: ongoing  5.  Patient will be able to perform forward step down test without deviation in order to demonstrate improved LE strength and motor control.  Baseline:  Goal status: ongoing  6.  Patient will demonstrate grade of 5/5 MMT grade in all tested musculature as evidence of improved strength to assist with stair ambulation and gait.   Baseline:  Goal status: ongoing   PLAN:  PT FREQUENCY: 1-2x/week  PT DURATION: 12 weeks  PLANNED INTERVENTIONS: 97164- PT Re-evaluation, 97110-Therapeutic exercises, 97530- Therapeutic activity, 97112- Neuromuscular re-education, 97535- Self Care, 02859- Manual therapy, [phone]- Gait training, [phone]- Orthotic Fit/training, [phone]- Canalith repositioning, [id number]- Aquatic Therapy, [phone]- Splinting, Patient/Family education, Balance training, Stair training, Taping, Dry Needling, Joint mobilization, Joint manipulation, Spinal manipulation, Spinal  mobilization, Scar mobilization, and DME instructions.  PLAN FOR NEXT SESSION: per op note follow the MPFL reconstruction protocol; progress depth of squat, progress lunging and SL stance patterns   Asberry BRAVO Latif Nazareno, PTA 11/23/2023, 9:51 AM

## 2023-11-24 ENCOUNTER — Ambulatory Visit: Admitting: Family Medicine

## 2023-11-24 ENCOUNTER — Other Ambulatory Visit (HOSPITAL_BASED_OUTPATIENT_CLINIC_OR_DEPARTMENT_OTHER): Payer: Self-pay

## 2023-11-24 VITALS — BP 123/68 | HR 85 | Ht 64.0 in | Wt 195.0 lb

## 2023-11-24 DIAGNOSIS — E538 Deficiency of other specified B group vitamins: Secondary | ICD-10-CM | POA: Diagnosis not present

## 2023-11-24 MED ORDER — CYANOCOBALAMIN 1000 MCG/ML IJ SOLN
1000.0000 ug | Freq: Once | INTRAMUSCULAR | 0 refills | Status: DC
  Filled 2023-11-24: qty 1, 1d supply, fill #0

## 2023-11-24 MED ORDER — CYANOCOBALAMIN 1000 MCG/ML IJ SOLN
1000.0000 ug | Freq: Once | INTRAMUSCULAR | Status: AC
  Administered 2023-11-24: 1000 ug via INTRAMUSCULAR

## 2023-11-24 NOTE — Progress Notes (Signed)
 Pt came in today for B12 injection.   Pt reports no negative side effects from medication. Denies any dizziness, chest pain or palpitations, and no GI problems..  Injection  tolerated well. Given in RD. She will RTC in 30 days for next injection.

## 2023-11-28 ENCOUNTER — Encounter (HOSPITAL_BASED_OUTPATIENT_CLINIC_OR_DEPARTMENT_OTHER): Payer: Self-pay

## 2023-11-28 ENCOUNTER — Ambulatory Visit (HOSPITAL_BASED_OUTPATIENT_CLINIC_OR_DEPARTMENT_OTHER)

## 2023-11-28 DIAGNOSIS — M6281 Muscle weakness (generalized): Secondary | ICD-10-CM

## 2023-11-28 DIAGNOSIS — R262 Difficulty in walking, not elsewhere classified: Secondary | ICD-10-CM

## 2023-11-28 DIAGNOSIS — M25562 Pain in left knee: Secondary | ICD-10-CM | POA: Diagnosis not present

## 2023-11-28 DIAGNOSIS — G8929 Other chronic pain: Secondary | ICD-10-CM

## 2023-11-28 NOTE — Progress Notes (Signed)
 Agree with documentation as above.   Nani Gasser, MD

## 2023-11-28 NOTE — Therapy (Signed)
 OUTPATIENT PHYSICAL THERAPY LOWER EXTREMITY TREATMENT   Patient Name: Megan [name] MRN: [id number] DOB:August 12, 1970, 53 y.o., female Today's Date: 11/28/2023  END OF SESSION:  PT End of Session - 11/28/23 0800     Visit Number 12    Number of Visits 24    Date for PT Re-Evaluation 12/09/23    Authorization Type [employer]    PT Start Time 0800    PT Stop Time 0845    PT Time Calculation (min) 45 min    Activity Tolerance Patient tolerated treatment well    Behavior During Therapy [facility] for tasks assessed/performed                    Past Medical History:  Diagnosis Date   Gestational diabetes mellitus in childbirth    Hypertension    Past Surgical History:  Procedure Laterality Date   IMPLANTATION, MATRIX-INDUCED AUTOLOGOUS CHONDROCYTES Left 09/12/2023   Procedure: IMPLANTATION, MATRIX-INDUCED AUTOLOGOUS CHONDROCYTES;  Surgeon: Genelle Standing, MD;  Location: [facility];  Service: Orthopedics;  Laterality: Left;   KNEE ARTHROSCOPY WITH MACI  CARTILAGE HARVEST Left 07/24/2023   Procedure: ARTHROSCOPY, KNEE, WITH CARTILAGE PROCUREMENT FOR CULTURE;  Surgeon: Genelle Standing, MD;  Location: [facility];  Service: Orthopedics;  Laterality: Left;  LEFT KNEE ARTHROSCOPY WITH MACI  HARVEST   MEDIAL PATELLOFEMORAL LIGAMENT REPAIR Left 09/12/2023   Procedure: RECONSTRUCTION, LIGAMENT, MEDIAL PATELLOFEMORAL;  Surgeon: Genelle Standing, MD;  Location: [facility];  Service: Orthopedics;  Laterality: Left;  LEFT KNEE MEDIAL PATELLOFEMORAL LIGAMENT RECONSTRUCTION ANDMATRIX ASSOCIATED CHONDROCYTES IMPLANTATION (PATELLA AND FEMUR)   TONSILLECTOMY AND ADENOIDECTOMY     TYMPANOSTOMY TUBE PLACEMENT  1984   Patient Active Problem List   Diagnosis Date Noted   Patellar instability of left knee 07/24/2023   High serum copper  level 03/01/2023   Vitamin B12 deficiency 10/31/2022   Encounter for weight management 10/31/2022   Left lumbar radiculitis 07/13/2022    Right hand pain 10/28/2021   Raynaud's disease, idiopathic 07/08/2019   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HCC) 03/06/2019   Near syncope 02/12/2019   Microalbuminuria due to type 2 diabetes mellitus (HCC) 02/12/2019   Guyon syndrome, 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   Cubital tunnel syndrome on 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   Flexor carpi ulnaris tenosynovitis 08/30/2018   Patellar subluxation, left, initial encounter 09/23/2015   Plantar fasciitis, bilateral 01/08/2014   Reaction, situational, acute, to stress 10/16/2013   Lumbar pars defect 10/15/2012   Idiopathic scoliosis 10/11/2012   Essential hypertension, benign 12/27/2011   Hyperlipidemia 12/27/2011   Diabetes type 2, controlled (HCC) 12/27/2011   POLYCYSTIC OVARIAN DISEASE 05/06/2008    PCP: Alvan Dorothyann BIRCH, MD  REFERRING PROVIDER: Genelle Standing, MD  REFERRING DIAG: [phone] (ICD-10-CM) - Patellar instability of left knee; MPFL and MACI  implant DOS 09/12/23  THERAPY DIAG:  Muscle weakness (generalized)  Chronic pain of left knee  Difficulty in walking, not elsewhere classified  Rationale for Evaluation and Treatment: Rehabilitation  ONSET DATE: DOS 09/12/23  SUBJECTIVE:   SUBJECTIVE STATEMENT: Pt denies pain at entry but reports tightness in L knee.   PERTINENT HISTORY: HTN, HLD, DM, idiopathic scoliosis PAIN:  Are you having pain? No: NPRS scale: 0/10 Pain location: L knee Pain description: aching, pulling Aggravating factors: weightbearing Relieving factors: rest  PRECAUTIONS: Knee  WEIGHT BEARING RESTRICTIONS: WBAT  FALLS:  Has patient fallen in last 6 months? No   OCCUPATION: Supervisor at [employer]  PLOF: Independent  PATIENT GOALS: to walk right and without pain  OBJECTIVE: (objective measures from initial evaluation  unless otherwise dated)   PATIENT SURVEYS: LEFS 23/80  7/15    Extreme difficulty/unable (0), Quite a bit of difficulty (1), Moderate difficulty (2), Little difficulty (3), No difficulty (4) Survey date:  7/15   Any of your usual work, housework or school activities 3  2. Usual hobbies, recreational or sporting activities 3  3. Getting into/out of the bath 4  4. Walking between rooms 4  5. Putting on socks/shoes 3  6. Squatting  2  7. Lifting an object, like a bag of groceries from the floor 4  8. Performing light activities around your home 3  9. Performing heavy activities around your home 3  10. Getting into/out of a car 4  11. Walking 2 blocks 4  12. Walking 1 mile 4  13. Going up/down 10 stairs (1 flight) 2  14. Standing for 1 hour 4  15.  sitting for 1 hour 4  16. Running on even ground 3  17. Running on uneven ground 2  18. Making sharp turns while running fast 1  19. Hopping  2  20. Rolling over in bed 4  Score total:  63/80     COGNITION: Overall cognitive status: Within functional limits for tasks assessed     SENSATION:WFL  EDEMA: gross edema L knee  POSTURE: No Significant postural limitations  PALPATION: grossly TTP L knee  LOWER EXTREMITY ROM:  Active ROM Right eval Left eval L 7/17  Hip flexion     Hip extension     Hip abduction     Hip adduction     Hip internal rotation     Hip external rotation     Knee flexion  110 115  Knee extension  3 0  Ankle dorsiflexion     Ankle plantarflexion     Ankle inversion     Ankle eversion      (Blank rows = not tested) *= pain/symptoms  LOWER EXTREMITY MMT:   MMT Right eval Left eval L 7/15  Hip flexion     Hip extension     Hip abduction     Hip adduction     Hip internal rotation     Hip external rotation     Knee flexion   4+/5  Knee extension   4+/5  Ankle dorsiflexion     Ankle plantarflexion     Ankle inversion     Ankle eversion      (Blank rows = not tested) *= pain/symptoms   GAIT: Distance walked: 50 feet WNL   TODAY'S TREATMENT:                                                                                                                              DATE:  7/22  Recumbent  bike L4 x19min  Patellar sup and inf mobs grade II-III L knee flexion mob with tibial IR and ER grade II-III  Prone quad stretch 30s 4x Single leg  bridges 2x10 3 hold STS from low table 2x15 staggered ( L posterior) 4 fwd eccentric step down 2x10 SL heel raise 2x15 Runner step up 8 2x15  7/17  Recumbent bike L3 x66min  Patellar sup and inf mobs grade II-III L knee flexion mob with tibial IR and ER grade II-III  Prone quad stretch 30s 4x Squats 2x10 STS from low table 2x10 staggered ( L posterior) 4 fwd eccentric step down 2x10   7/15  Recumbent bike L3 x59min Patellar sup and inf mobs grade IV L knee flexion mob with tibial IR and ER grade IV  Prone quad stretch 30s 4x Support lunge 2x10 Stair step flexion stretch for home  Self scar and mobs for ROM, HEP updates  7/10  Recumbent bike L3 x30min PROM L knee STM to medial HS  STS from low table 2x10 staggered ( L posterior) Bridge on ball 3x10 (knee bent) 3sec hold 4 fwd eccentric step down 2x10 Grey leg press 40lbs 3x10 Knee ext machine 3x8 20lbs DL  SL balance on airex 69d 4x SL heel raise 2x15 Runner step up 8 2x10   7/1  Nu step lvl 5 6 min STS from low table 4x8  Bridge on ball 3x10 (knee bent) 3sec hold 4 lateral step down 3x10 single UE support Grey leg press 40lbs 3x10 Knee ext machine 3x8 20lbs DL  SL balance on airex 69d 4x SL heel raise 2x20   PATIENT EDUCATION:  Education details: surgical precautions and goals, anatomy, exercise progression, DOMS expectations, muscle firing,  envelope of function, HEP, POC  Person educated: Patient Education method: Explanation, Demonstration, and Handouts Education comprehension: verbalized understanding, returned demonstration, verbal cues required, and tactile cues required  HOME EXERCISE PROGRAM: Access Code: [email or web address] (electronic)  URL: [email or web address] Date: 09/16/2023 Prepared by: Prentice  Zaunegger    ASSESSMENT:  CLINICAL IMPRESSION:   Continued to work on increasing available knee flexion today. Pt measured at 115 following PROM and then 118 after knee mobilizations and prone quad stretch. Good tolerance for step ups and eccentric step downs, though mild instability observed. With staggered sit to stands, she does demonstrate mild weight shift to R side, but able to self correct. Pt to continue working on ROM at home. Will progress as tolerated.    Continued to address ROM and mobility deficits today. Measured at 115 following mobilizations, PROM, and prone quad stretching. Instructed pt to continue working on ROM tasks at home within pain tolerance. With squats today, she does demonstrate slight off loading of L LE. No pain reported with weight bearing activities, though she did demonstrate mod difficulty with eccentric step downs from 4 step. Will continue to address ROM and strength deficits in PT.    Pt is now 9 weeks s/p. Pt with limited knee flexion but full knee ext. Pt advised on self mobilization techniques to improve knee flexion. HEP updated today for progressive loaded stretching as well L LE strength. Plan progress with SL stability and update HEP with progressive strengthening. Pt is progressing well with therapy and has met STG. LTG are ongoing at this time. Pt with benefit from addition PT to improve knee ROM, strength, and stability.   OBJECTIVE IMPAIRMENTS: Abnormal gait, decreased activity tolerance, decreased balance, decreased endurance, decreased mobility, difficulty walking, decreased ROM, decreased strength, impaired flexibility, improper body mechanics, and pain.   ACTIVITY LIMITATIONS: carrying, lifting, bending, standing, squatting, sleeping, stairs, transfers, bathing, hygiene/grooming, locomotion level, and caring for others  PARTICIPATION LIMITATIONS: meal prep, cleaning, laundry,  shopping, community activity, occupation, and yard work  PERSONAL  FACTORS: Fitness, Time since onset of injury/illness/exacerbation, and 3+ comorbidities: HTN, HLD, DM, idiopathic scoliosis are also affecting patient's functional outcome.   REHAB POTENTIAL: Good  CLINICAL DECISION MAKING: Stable/uncomplicated  EVALUATION COMPLEXITY: Low   GOALS: Goals reviewed with patient? Yes  SHORT TERM GOALS: Target date: 10/14/2023    Patient will be independent with HEP in order to improve functional outcomes. Baseline: Goal status: MET  2.  Patient will report at least 25% improvement in symptoms for improved quality of life. Baseline: Goal status: MET  3.  Patient will perform SLR without lag. Baseline:  Goal status: MET    LONG TERM GOALS: Target date: 12/09/2023    Patient will report at least 75% improvement in symptoms for improved quality of life. Baseline:  Goal status: ongoing  2.  Patient will improve LEFS score by at least 18 points in order to indicate improved tolerance to activity. Baseline:  Goal status: ongoing  3.  Patient will be able to squat without compensation in order to demonstrate improved LE strength. Baseline:  Goal status: ongoing  4.  Patient will demonstrate full left knee ROM for improved ability to squat and navigate stairs. Baseline:  Goal status: ongoing  5.  Patient will be able to perform forward step down test without deviation in order to demonstrate improved LE strength and motor control.  Baseline:  Goal status: ongoing  6.  Patient will demonstrate grade of 5/5 MMT grade in all tested musculature as evidence of improved strength to assist with stair ambulation and gait.   Baseline:  Goal status: ongoing   PLAN:  PT FREQUENCY: 1-2x/week  PT DURATION: 12 weeks  PLANNED INTERVENTIONS: 97164- PT Re-evaluation, 97110-Therapeutic exercises, 97530- Therapeutic activity, 97112- Neuromuscular re-education, 97535- Self Care, 02859- Manual therapy, [phone]- Gait training, [phone]- Orthotic Fit/training,  [phone]- Canalith repositioning, [id number]- Aquatic Therapy, [phone]- Splinting, Patient/Family education, Balance training, Stair training, Taping, Dry Needling, Joint mobilization, Joint manipulation, Spinal manipulation, Spinal mobilization, Scar mobilization, and DME instructions.  PLAN FOR NEXT SESSION: per op note follow the MPFL reconstruction protocol; progress depth of squat, progress lunging and SL stance patterns   Asberry BRAVO Mathhew Buysse, PTA 11/28/2023, 9:57 AM

## 2023-11-29 ENCOUNTER — Ambulatory Visit (HOSPITAL_BASED_OUTPATIENT_CLINIC_OR_DEPARTMENT_OTHER): Admitting: Orthopaedic Surgery

## 2023-11-29 DIAGNOSIS — M25362 Other instability, left knee: Secondary | ICD-10-CM

## 2023-11-29 NOTE — Progress Notes (Signed)
 Post Operative Evaluation    Procedure/Date of Surgery: Left knee Maci  placement with MPFL reconstruction 09/12/23  Interval History:   Presents today post above procedure overall doing extremely well.  Overall she is continuing to work progressive lately on range of motion.  She is occasionally experiencing some tightness and fullness in the front of the knee with stairs but overall continuing to improve  PMH/PSH/Family History/Social History/Meds/Allergies:    Past Medical History:  Diagnosis Date   Gestational diabetes mellitus in childbirth    Hypertension    Past Surgical History:  Procedure Laterality Date   IMPLANTATION, MATRIX-INDUCED AUTOLOGOUS CHONDROCYTES Left 09/12/2023   Procedure: IMPLANTATION, MATRIX-INDUCED AUTOLOGOUS CHONDROCYTES;  Surgeon: Genelle Standing, MD;  Location: [facility];  Service: Orthopedics;  Laterality: Left;   KNEE ARTHROSCOPY WITH MACI  CARTILAGE HARVEST Left 07/24/2023   Procedure: ARTHROSCOPY, KNEE, WITH CARTILAGE PROCUREMENT FOR CULTURE;  Surgeon: Genelle Standing, MD;  Location: [facility];  Service: Orthopedics;  Laterality: Left;  LEFT KNEE ARTHROSCOPY WITH MACI  HARVEST   MEDIAL PATELLOFEMORAL LIGAMENT REPAIR Left 09/12/2023   Procedure: RECONSTRUCTION, LIGAMENT, MEDIAL PATELLOFEMORAL;  Surgeon: Genelle Standing, MD;  [facility];  Service: Orthopedics;  Laterality: Left;  LEFT KNEE MEDIAL PATELLOFEMORAL LIGAMENT RECONSTRUCTION ANDMATRIX ASSOCIATED CHONDROCYTES IMPLANTATION (PATELLA AND FEMUR)   TONSILLECTOMY AND ADENOIDECTOMY     TYMPANOSTOMY TUBE PLACEMENT  1984   Social History   Socioeconomic History   Marital status: Single    Spouse name: Cherrelle Plante   Number of children: Not on file   Years of education: Not on file   Highest education level: Associate degree: academic program  Occupational History   Occupation: Asst Diplomatic Services operational officer    Comment:  [employer]   Tobacco Use   Smoking status: Never   Smokeless tobacco: Never  Vaping Use   Vaping status: Never Used  Substance and Sexual Activity   Alcohol use: No   Drug use: No   Sexual activity: Yes    Partners: Male  Other Topics Concern   Not on file  Social History Narrative   2 caffeine  drinks per day. No regular exercise. She works full-time and is going to school.   Social Drivers of Corporate investment banker Strain: Low Risk  (08/26/2023)   Overall Financial Resource Strain (CARDIA)    Difficulty of Paying Living Expenses: Not very hard  Food Insecurity: No Food Insecurity (08/26/2023)   Hunger Vital Sign    Worried About Running Out of Food in the Last Year: Never true    Ran Out of Food in the Last Year: Never true  Transportation Needs: No Transportation Needs (08/26/2023)   PRAPARE - Administrator, Civil Service (Medical): No    Lack of Transportation (Non-Medical): No  Physical Activity: Unknown (08/26/2023)   Exercise Vital Sign    Days of Exercise per Week: 0 days    Minutes of Exercise per Session: Not on file  Stress: No Stress Concern Present (08/26/2023)   [employer] of Occupational Health - Occupational Stress Questionnaire    Feeling of Stress : Only a little  Social Connections: Socially Isolated (08/26/2023)   Social Connection and Isolation Panel    Frequency of Communication with Friends and Family: Once a week    Frequency of Social Gatherings  with Friends and Family: Once a week    Attends Religious Services: Never    Database administrator or Organizations: No    Attends Engineer, structural: Not on file    Marital Status: Never married   Family History  Problem Relation Age of Onset   Hypertension Mother    Celiac disease Mother    Hypertension Father    Hypertension Sister    Hypertension Brother    Peripheral vascular disease Maternal Grandmother    Diabetes Paternal Aunt    Diabetes Maternal Uncle     Diabetes Paternal Aunt    Colon cancer Maternal Grandfather    Crohn's disease Cousin    Allergies  Allergen Reactions   Metformin  And Related Other (See Comments)    GI upset    Sertraline  Nausea Only    sweats   Current Outpatient Medications  Medication Sig Dispense Refill   AMBULATORY NON FORMULARY MEDICATION One touch ultra 2 glucose system  Use to test once daily as directed  Please schedule f/u appt 1 each 0   amLODipine  (NORVASC ) 5 MG tablet TAKE 1 TABLET BY MOUTH DAILY 90 tablet 3   aspirin  EC 325 MG tablet Take 1 tablet (325 mg total) by mouth daily. 14 tablet 0   atorvastatin  (LIPITOR) 40 MG tablet Take 1 tablet (40 mg total) by mouth daily. 90 tablet 3   losartan -hydrochlorothiazide  (HYZAAR) 100-12.5 MG tablet TAKE 1 TABLET BY MOUTH DAILY 90 tablet 3   norgestrel-ethinyl estradiol (LO/OVRAL,CRYSELLE) 0.3-30 MG-MCG tablet Take 1 tablet by mouth daily.     oxyCODONE  (ROXICODONE ) 5 MG immediate release tablet Take 1 tablet (5 mg total) by mouth every 4 (four) hours as needed for severe pain (pain score 7-10) or breakthrough pain. 15 tablet 0   Semaglutide , 2 MG/DOSE, (OZEMPIC , 2 MG/DOSE,) 8 MG/3ML SOPN Inject 2 mg (0.75 ml) into the skin once a week. 9 mL 2   No current facility-administered medications for this visit.   No results found.  Review of Systems:   A ROS was performed including pertinent positives and negatives as documented in the HPI.   Musculoskeletal Exam:    There were no vitals taken for this visit.  Left knee incisions are well-appearing without erythema or drainage.  There is no lateral laxity with testing of the patella in extension.  Range of motion is from 0 to 35 degrees.  Trace effusion  Imaging:      I personally reviewed and interpreted the radiographs.   Assessment:   12 weeks status post left knee MPFL reconstruction with Maci  placement overall doing extremely well.  She will continue to work on range of motion and strengthening of  the left knee.  I will plan to see her back in 12 weeks for reassessment Plan :    - Return to clinic 12 weeks for reassessment       I personally saw and evaluated the patient, and participated in the management and treatment plan.  Elspeth Parker, MD Attending Physician, Orthopedic Surgery  This document was dictated using Dragon voice recognition software. A reasonable attempt at proof reading has been made to minimize errors.

## 2023-11-30 ENCOUNTER — Ambulatory Visit (HOSPITAL_BASED_OUTPATIENT_CLINIC_OR_DEPARTMENT_OTHER)

## 2023-12-05 ENCOUNTER — Ambulatory Visit (HOSPITAL_BASED_OUTPATIENT_CLINIC_OR_DEPARTMENT_OTHER): Admitting: Physical Therapy

## 2023-12-07 ENCOUNTER — Ambulatory Visit (HOSPITAL_BASED_OUTPATIENT_CLINIC_OR_DEPARTMENT_OTHER)

## 2023-12-07 ENCOUNTER — Encounter (HOSPITAL_BASED_OUTPATIENT_CLINIC_OR_DEPARTMENT_OTHER): Payer: Self-pay

## 2023-12-07 DIAGNOSIS — G8929 Other chronic pain: Secondary | ICD-10-CM

## 2023-12-07 DIAGNOSIS — M6281 Muscle weakness (generalized): Secondary | ICD-10-CM

## 2023-12-07 DIAGNOSIS — M25562 Pain in left knee: Secondary | ICD-10-CM | POA: Diagnosis not present

## 2023-12-07 DIAGNOSIS — R262 Difficulty in walking, not elsewhere classified: Secondary | ICD-10-CM

## 2023-12-07 NOTE — Therapy (Signed)
 OUTPATIENT PHYSICAL THERAPY LOWER EXTREMITY TREATMENT   Patient Name: Megan [name] MRN: [id number] DOB:09/21/1970, 53 y.o., female Today's Date: 12/07/2023  END OF SESSION:  PT End of Session - 12/07/23 0759     Visit Number 13    Number of Visits 24    Date for PT Re-Evaluation 12/09/23    Authorization Type [employer]    PT Start Time 0803    PT Stop Time 0845    PT Time Calculation (min) 42 min    Activity Tolerance Patient tolerated treatment well    Behavior During Therapy [facility] for tasks assessed/performed                     Past Medical History:  Diagnosis Date   Gestational diabetes mellitus in childbirth    Hypertension    Past Surgical History:  Procedure Laterality Date   IMPLANTATION, MATRIX-INDUCED AUTOLOGOUS CHONDROCYTES Left 09/12/2023   Procedure: IMPLANTATION, MATRIX-INDUCED AUTOLOGOUS CHONDROCYTES;  Surgeon: Genelle Standing, MD;  Location: [facility];  Service: Orthopedics;  Laterality: Left;   KNEE ARTHROSCOPY WITH MACI  CARTILAGE HARVEST Left 07/24/2023   Procedure: ARTHROSCOPY, KNEE, WITH CARTILAGE PROCUREMENT FOR CULTURE;  Surgeon: Genelle Standing, MD;  Location: [facility];  Service: Orthopedics;  Laterality: Left;  LEFT KNEE ARTHROSCOPY WITH MACI  HARVEST   MEDIAL PATELLOFEMORAL LIGAMENT REPAIR Left 09/12/2023   Procedure: RECONSTRUCTION, LIGAMENT, MEDIAL PATELLOFEMORAL;  Surgeon: Genelle Standing, MD;  Location: [facility];  Service: Orthopedics;  Laterality: Left;  LEFT KNEE MEDIAL PATELLOFEMORAL LIGAMENT RECONSTRUCTION ANDMATRIX ASSOCIATED CHONDROCYTES IMPLANTATION (PATELLA AND FEMUR)   TONSILLECTOMY AND ADENOIDECTOMY     TYMPANOSTOMY TUBE PLACEMENT  1984   Patient Active Problem List   Diagnosis Date Noted   Patellar instability of left knee 07/24/2023   High serum copper  level 03/01/2023   Vitamin B12 deficiency 10/31/2022   Encounter for weight management 10/31/2022   Left lumbar radiculitis  07/13/2022   Right hand pain 10/28/2021   Raynaud's disease, idiopathic 07/08/2019   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HCC) 03/06/2019   Near syncope 02/12/2019   Microalbuminuria due to type 2 diabetes mellitus (HCC) 02/12/2019   Guyon syndrome, 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   Cubital tunnel syndrome on 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   Flexor carpi ulnaris tenosynovitis 08/30/2018   Patellar subluxation, left, initial encounter 09/23/2015   Plantar fasciitis, bilateral 01/08/2014   Reaction, situational, acute, to stress 10/16/2013   Lumbar pars defect 10/15/2012   Idiopathic scoliosis 10/11/2012   Essential hypertension, benign 12/27/2011   Hyperlipidemia 12/27/2011   Diabetes type 2, controlled (HCC) 12/27/2011   POLYCYSTIC OVARIAN DISEASE 05/06/2008    PCP: Alvan Dorothyann BIRCH, MD  REFERRING PROVIDER: Genelle Standing, MD  REFERRING DIAG: [id number] (ICD-10-CM) - Patellar instability of left knee; MPFL and MACI  implant DOS 09/12/23  THERAPY DIAG:  Muscle weakness (generalized)  Chronic pain of left knee  Difficulty in walking, not elsewhere classified  Rationale for Evaluation and Treatment: Rehabilitation  ONSET DATE: DOS 09/12/23  SUBJECTIVE:   SUBJECTIVE STATEMENT: Pt reports MD visit went well. I'm further along than he thought I would be. She helped her parents move last weekend and was sore afterwards. Cancelled PT appt on Tuesday due to this.   PERTINENT HISTORY: HTN, HLD, DM, idiopathic scoliosis PAIN:  Are you having pain? No: NPRS scale: 0/10 Pain location: L knee Pain description: aching, pulling Aggravating factors: weightbearing Relieving factors: rest  PRECAUTIONS: Knee  WEIGHT BEARING RESTRICTIONS: WBAT  FALLS:  Has patient fallen in last 6 months? No  OCCUPATION: Supervisor at [employer]  PLOF: Independent  PATIENT GOALS: to walk right and without pain  OBJECTIVE: (objective measures from initial evaluation unless otherwise dated)   PATIENT SURVEYS: LEFS 23/80  7/15     Extreme difficulty/unable (0), Quite a bit of difficulty (1), Moderate difficulty (2), Little difficulty (3), No difficulty (4) Survey date:  7/15  Any of your usual work, housework or school activities 3  2. Usual hobbies, recreational or sporting activities 3  3. Getting into/out of the bath 4  4. Walking between rooms 4  5. Putting on socks/shoes 3  6. Squatting  2  7. Lifting an object, like a bag of groceries from the floor 4  8. Performing light activities around your home 3  9. Performing heavy activities around your home 3  10. Getting into/out of a car 4  11. Walking 2 blocks 4  12. Walking 1 mile 4  13. Going up/down 10 stairs (1 flight) 2  14. Standing for 1 hour 4  15.  sitting for 1 hour 4  16. Running on even ground 3  17. Running on uneven ground 2  18. Making sharp turns while running fast 1  19. Hopping  2  20. Rolling over in bed 4  Score total:  63/80     COGNITION: Overall cognitive status: Within functional limits for tasks assessed     SENSATION:WFL  EDEMA: gross edema L knee  POSTURE: No Significant postural limitations  PALPATION: grossly TTP L knee  LOWER EXTREMITY ROM:  Active ROM Right eval Left eval L 7/17  Hip flexion     Hip extension     Hip abduction     Hip adduction     Hip internal rotation     Hip external rotation     Knee flexion  110 115  Knee extension  3 0  Ankle dorsiflexion     Ankle plantarflexion     Ankle inversion     Ankle eversion      (Blank rows = not tested) *= pain/symptoms  LOWER EXTREMITY MMT:   MMT Right eval Left eval L 7/15  Hip flexion     Hip extension     Hip abduction     Hip adduction     Hip internal rotation     Hip external rotation     Knee flexion   4+/5  Knee extension   4+/5  Ankle dorsiflexion     Ankle plantarflexion     Ankle inversion     Ankle eversion      (Blank rows = not tested) *= pain/symptoms   GAIT: Distance walked: 50 feet WNL   TODAY'S TREATMENT:                                                                                                                               DATE:   7/31  Recumbent bike L3 x19min  Patellar sup  and inf mobs grade II-III L tib/fem mobilization with posterior glide grade II-III  Prone quad stretch 30s 4x Single leg bridges 2x10 3 hold STS from low table 2x15 staggered ( L posterior) 4 fwd eccentric step down 2x10 SL heel raise 2x15 Runner step up 8 2x15 Grey leg press 40lbs 3x10 Knee ext machine 3x8 20lbs DL  2/77  Recumbent bike L4 x52min  Patellar sup and inf mobs grade II-III L knee flexion mob with tibial IR and ER grade II-III  Prone quad stretch 30s 4x Single leg bridges 2x10 3 hold STS from low table 2x15 staggered ( L posterior) 4 fwd eccentric step down 2x10 SL heel raise 2x15 Runner step up 8 2x15  7/17  Recumbent bike L3 x54min  Patellar sup and inf mobs grade II-III L knee flexion mob with tibial IR and ER grade II-III  Prone quad stretch 30s 4x Squats 2x10 STS from low table 2x10 staggered ( L posterior) 4 fwd eccentric step down 2x10   7/15  Recumbent bike L3 x48min Patellar sup and inf mobs grade IV L knee flexion mob with tibial IR and ER grade IV  Prone quad stretch 30s 4x Support lunge 2x10 Stair step flexion stretch for home  Self scar and mobs for ROM, HEP updates  7/10  Recumbent bike L3 x22min PROM L knee STM to medial HS  STS from low table 2x10 staggered ( L posterior) Bridge on ball 3x10 (knee bent) 3sec hold 4 fwd eccentric step down 2x10 Grey leg press 40lbs 3x10 Knee ext machine 3x8 20lbs DL  SL balance on airex 69d 4x SL heel raise 2x15 Runner step up 8 2x10   7/1  Nu step lvl 5 6 min STS from low table 4x8  Bridge on ball 3x10 (knee bent) 3sec hold 4 lateral step down 3x10 single UE support Grey leg press 40lbs 3x10 Knee ext machine 3x8 20lbs DL  SL balance on airex 69d 4x SL heel raise 2x20   PATIENT EDUCATION:   Education details: surgical precautions and goals, anatomy, exercise progression, DOMS expectations, muscle firing,  envelope of function, HEP, POC  Person educated: Patient Education method: Explanation, Demonstration, and Handouts Education comprehension: verbalized understanding, returned demonstration, verbal cues required, and tactile cues required  HOME EXERCISE PROGRAM: Access Code: [email or web address] (electronic)  URL: [email or web address] Date: 09/16/2023 Prepared by: Prentice Zaunegger    ASSESSMENT:  CLINICAL IMPRESSION:   No complaints of pain with any interventions today. She remains tight into end range knee flexion, so continued to work on PROM and tib/fem joint mobilizations to improve this. Pt challenged by eccentric step downs and machine strengthening. Will continue to work on ROM and strengthening as tolerated.    Continued to address ROM and mobility deficits today. Measured at 115 following mobilizations, PROM, and prone quad stretching. Instructed pt to continue working on ROM tasks at home within pain tolerance. With squats today, she does demonstrate slight off loading of L LE. No pain reported with weight bearing activities, though she did demonstrate mod difficulty with eccentric step downs from 4 step. Will continue to address ROM and strength deficits in PT.    Pt is now 9 weeks s/p. Pt with limited knee flexion but full knee ext. Pt advised on self mobilization techniques to improve knee flexion. HEP updated today for progressive loaded stretching as well L LE strength. Plan progress with SL stability and update HEP with progressive strengthening. Pt is progressing well with therapy and has met STG. LTG  are ongoing at this time. Pt with benefit from addition PT to improve knee ROM, strength, and stability.   OBJECTIVE IMPAIRMENTS: Abnormal gait, decreased activity tolerance, decreased balance, decreased endurance, decreased mobility, difficulty walking,  decreased ROM, decreased strength, impaired flexibility, improper body mechanics, and pain.   ACTIVITY LIMITATIONS: carrying, lifting, bending, standing, squatting, sleeping, stairs, transfers, bathing, hygiene/grooming, locomotion level, and caring for others  PARTICIPATION LIMITATIONS: meal prep, cleaning, laundry, shopping, community activity, occupation, and yard work  PERSONAL FACTORS: Fitness, Time since onset of injury/illness/exacerbation, and 3+ comorbidities: HTN, HLD, DM, idiopathic scoliosis are also affecting patient's functional outcome.   REHAB POTENTIAL: Good  CLINICAL DECISION MAKING: Stable/uncomplicated  EVALUATION COMPLEXITY: Low   GOALS: Goals reviewed with patient? Yes  SHORT TERM GOALS: Target date: 10/14/2023    Patient will be independent with HEP in order to improve functional outcomes. Baseline: Goal status: MET  2.  Patient will report at least 25% improvement in symptoms for improved quality of life. Baseline: Goal status: MET  3.  Patient will perform SLR without lag. Baseline:  Goal status: MET    LONG TERM GOALS: Target date: 12/09/2023    Patient will report at least 75% improvement in symptoms for improved quality of life. Baseline:  Goal status: ongoing  2.  Patient will improve LEFS score by at least 18 points in order to indicate improved tolerance to activity. Baseline:  Goal status: ongoing  3.  Patient will be able to squat without compensation in order to demonstrate improved LE strength. Baseline:  Goal status: ongoing  4.  Patient will demonstrate full left knee ROM for improved ability to squat and navigate stairs. Baseline:  Goal status: ongoing  5.  Patient will be able to perform forward step down test without deviation in order to demonstrate improved LE strength and motor control.  Baseline:  Goal status: ongoing  6.  Patient will demonstrate grade of 5/5 MMT grade in all tested musculature as evidence of improved  strength to assist with stair ambulation and gait.   Baseline:  Goal status: ongoing   PLAN:  PT FREQUENCY: 1-2x/week  PT DURATION: 12 weeks  PLANNED INTERVENTIONS: 97164- PT Re-evaluation, 97110-Therapeutic exercises, 97530- Therapeutic activity, 97112- Neuromuscular re-education, 97535- Self Care, 02859- Manual therapy, [phone]- Gait training, [phone]- Orthotic Fit/training, [phone]- Canalith repositioning, [id number]- Aquatic Therapy, [phone]- Splinting, Patient/Family education, Balance training, Stair training, Taping, Dry Needling, Joint mobilization, Joint manipulation, Spinal manipulation, Spinal mobilization, Scar mobilization, and DME instructions.  PLAN FOR NEXT SESSION: per op note follow the MPFL reconstruction protocol; progress depth of squat, progress lunging and SL stance patterns   Asberry BRAVO Kristel Durkee, PTA 12/07/2023, 9:01 AM

## 2023-12-14 ENCOUNTER — Ambulatory Visit (HOSPITAL_BASED_OUTPATIENT_CLINIC_OR_DEPARTMENT_OTHER): Payer: Self-pay | Attending: Orthopaedic Surgery | Admitting: Physical Therapy

## 2023-12-14 ENCOUNTER — Encounter (HOSPITAL_BASED_OUTPATIENT_CLINIC_OR_DEPARTMENT_OTHER): Payer: Self-pay | Admitting: Physical Therapy

## 2023-12-14 DIAGNOSIS — G8929 Other chronic pain: Secondary | ICD-10-CM | POA: Insufficient documentation

## 2023-12-14 DIAGNOSIS — R262 Difficulty in walking, not elsewhere classified: Secondary | ICD-10-CM | POA: Diagnosis present

## 2023-12-14 DIAGNOSIS — M6281 Muscle weakness (generalized): Secondary | ICD-10-CM | POA: Insufficient documentation

## 2023-12-14 DIAGNOSIS — M25562 Pain in left knee: Secondary | ICD-10-CM | POA: Insufficient documentation

## 2023-12-14 NOTE — Addendum Note (Signed)
 Addended by: Janiyah Beery S on: 12/14/2023 08:51 AM   Modules accepted: Orders

## 2023-12-14 NOTE — Therapy (Addendum)
 OUTPATIENT PHYSICAL THERAPY LOWER EXTREMITY TREATMENT   Patient Name: Megan [name] MRN: [id number] DOB:1970-10-20, 53 y.o., female Today's Date: 12/14/2023  PHYSICAL THERAPY DISCHARGE SUMMARY  Visits from Start of Care: 14  Current functional level related to goals / functional outcomes: See below   Remaining deficits: See below   Education / Equipment: See below   Patient agrees to discharge. Patient goals were partially met. Patient is being discharged due to being pleased with the current functional level.   Progress Note   Reporting Period 09/16/23 to 12/14/23   See note below for Objective Data and Assessment of Progress/Goals   END OF SESSION:  PT End of Session - 12/14/23 0800     Visit Number 14    Number of Visits 24    Date for PT Re-Evaluation 12/09/23    Authorization Type [employer]    PT Start Time 0800    PT Stop Time 0845    PT Time Calculation (min) 45 min    Activity Tolerance Patient tolerated treatment well    Behavior During Therapy WFL for tasks assessed/performed                     Past Medical History:  Diagnosis Date   Gestational diabetes mellitus in childbirth    Hypertension    Past Surgical History:  Procedure Laterality Date   IMPLANTATION, MATRIX-INDUCED AUTOLOGOUS CHONDROCYTES Left 09/12/2023   Procedure: IMPLANTATION, MATRIX-INDUCED AUTOLOGOUS CHONDROCYTES;  Surgeon: Genelle Standing, MD;  Location: [facility];  Service: Orthopedics;  Laterality: Left;   KNEE ARTHROSCOPY WITH MACI  CARTILAGE HARVEST Left 07/24/2023   Procedure: ARTHROSCOPY, KNEE, WITH CARTILAGE PROCUREMENT FOR CULTURE;  Surgeon: Genelle Standing, MD;  Location: [facility];  Service: Orthopedics;  Laterality: Left;  LEFT KNEE ARTHROSCOPY WITH MACI  HARVEST   MEDIAL PATELLOFEMORAL LIGAMENT REPAIR Left 09/12/2023   Procedure: RECONSTRUCTION, LIGAMENT, MEDIAL PATELLOFEMORAL;  Surgeon: Genelle Standing, MD;  Location:  SURGERY  CENTER;  Service: Orthopedics;  Laterality: Left;  LEFT KNEE MEDIAL PATELLOFEMORAL LIGAMENT RECONSTRUCTION ANDMATRIX ASSOCIATED CHONDROCYTES IMPLANTATION (PATELLA AND FEMUR)   TONSILLECTOMY AND ADENOIDECTOMY     TYMPANOSTOMY TUBE PLACEMENT  1984   Patient Active Problem List   Diagnosis Date Noted   Patellar instability of left knee 07/24/2023   High serum copper  level 03/01/2023   Vitamin B12 deficiency 10/31/2022   Encounter for weight management 10/31/2022   Left lumbar radiculitis 07/13/2022   Right hand pain 10/28/2021   Raynaud's disease, idiopathic 07/08/2019   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HCC) 03/06/2019   Near syncope 02/12/2019   Microalbuminuria due to type 2 diabetes mellitus (HCC) 02/12/2019   Guyon syndrome, 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   Cubital tunnel syndrome on 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   Flexor carpi ulnaris tenosynovitis 08/30/2018   Patellar subluxation, left, initial encounter 09/23/2015   Plantar fasciitis, bilateral 01/08/2014   Reaction, situational, acute, to stress 10/16/2013   Lumbar pars defect 10/15/2012   Idiopathic scoliosis 10/11/2012   Essential hypertension, benign 12/27/2011   Hyperlipidemia 12/27/2011   Diabetes type 2, controlled (HCC) 12/27/2011   POLYCYSTIC OVARIAN DISEASE 05/06/2008    PCP: Alvan Dorothyann BIRCH, MD  REFERRING PROVIDER: Genelle Standing, MD  REFERRING DIAG: [phone] (ICD-10-CM) - Patellar instability of left knee; MPFL and MACI  implant DOS 09/12/23  THERAPY DIAG:  Muscle weakness (generalized)  Chronic pain of left knee  Difficulty in walking, not elsewhere classified  Rationale for Evaluation and Treatment: Rehabilitation  ONSET DATE: DOS 09/12/23  SUBJECTIVE:   SUBJECTIVE STATEMENT: Patient states  HEP going well. 80% functional status. Remaining limitation is with bending the knee. Was limited in function prior to surgery, for past several years. Further along now than prior to surgery. Doesn't have the same pain now as before the surgery. She does  not have the same swelling now as before surgery as well. Feels like if she continues to work the knee it will improve further. Struggles with stairs.   PERTINENT HISTORY: HTN, HLD, DM, idiopathic scoliosis PAIN:  Are you having pain? No: NPRS scale: 0/10 Pain location: L knee Pain description: aching, pulling Aggravating factors: weightbearing Relieving factors: rest  PRECAUTIONS: Knee  WEIGHT BEARING RESTRICTIONS: WBAT  FALLS:  Has patient fallen in last 6 months? No   OCCUPATION: Supervisor at [employer]  PLOF: Independent  PATIENT GOALS: to walk right and without pain  OBJECTIVE: (objective measures from initial evaluation unless otherwise dated)   PATIENT SURVEYS: LEFS 23/80  7/15    Extreme difficulty/unable (0), Quite a bit of difficulty (1), Moderate difficulty (2), Little difficulty (3), No difficulty (4) Survey date:  7/15 12/14/23  Any of your usual work, housework or school activities 3 4  2. Usual hobbies, recreational or sporting activities 3 4  3. Getting into/out of the bath 4 4  4. Walking between rooms 4 4  5. Putting on socks/shoes 3 4  6. Squatting  2 3  7. Lifting an object, like a bag of groceries from the floor 4 4  8. Performing light activities around your home 3 4  9. Performing heavy activities around your home 3 4  10. Getting into/out of a car 4 4  11. Walking 2 blocks 4 4  12. Walking 1 mile 4 4  13. Going up/down 10 stairs (1 flight) 2 3  14. Standing for 1 hour 4 4  15.  sitting for 1 hour 4 4  16. Running on even ground 3 3  17. Running on uneven ground 2 3  18. Making sharp turns while running fast 1 4  19. Hopping  2 3  20. Rolling over in bed 4 4  Score total:  63/80 75/80     COGNITION: Overall cognitive status: Within functional limits for tasks assessed     SENSATION:WFL  EDEMA: gross edema L knee  POSTURE: No Significant postural limitations  PALPATION: grossly TTP L knee  LOWER EXTREMITY ROM:  Active ROM  Right eval Left eval L 7/17 Left 12/14/23  Hip flexion      Hip extension      Hip abduction      Hip adduction      Hip internal rotation      Hip external rotation      Knee flexion  110 115 125  Knee extension  3 0 0  Ankle dorsiflexion      Ankle plantarflexion      Ankle inversion      Ankle eversion       (Blank rows = not tested) *= pain/symptoms  LOWER EXTREMITY MMT:   MMT Right eval Left eval L 7/15 Left 12/14/23  Hip flexion      Hip extension      Hip abduction      Hip adduction      Hip internal rotation      Hip external rotation      Knee flexion   4+/5 5  Knee extension   4+/5 4+  Ankle dorsiflexion      Ankle plantarflexion  Ankle inversion      Ankle eversion       (Blank rows = not tested) *= pain/symptoms   GAIT: Distance walked: 50 feet WNL  Reassessment 12/14/23 Forward step down test: requires bilateral UE support for balance/strength deficit  Squat: weight shift off LLE with deeper squat, c/o anterior tightness  TODAY'S TREATMENT:                                                                                                                              DATE:  12/14/23 Reassessment Discussion of POC and symptoms   7/31  Recumbent bike L3 x71min  Patellar sup and inf mobs grade II-III L tib/fem mobilization with posterior glide grade II-III  Prone quad stretch 30s 4x Single leg bridges 2x10 3 hold STS from low table 2x15 staggered ( L posterior) 4 fwd eccentric step down 2x10 SL heel raise 2x15 Runner step up 8 2x15 Grey leg press 40lbs 3x10 Knee ext machine 3x8 20lbs DL  2/77  Recumbent bike L4 x29min  Patellar sup and inf mobs grade II-III L knee flexion mob with tibial IR and ER grade II-III  Prone quad stretch 30s 4x Single leg bridges 2x10 3 hold STS from low table 2x15 staggered ( L posterior) 4 fwd eccentric step down 2x10 SL heel raise 2x15 Runner step up 8 2x15  7/17  Recumbent bike L3  x64min  Patellar sup and inf mobs grade II-III L knee flexion mob with tibial IR and ER grade II-III  Prone quad stretch 30s 4x Squats 2x10 STS from low table 2x10 staggered ( L posterior) 4 fwd eccentric step down 2x10   7/15  Recumbent bike L3 x23min Patellar sup and inf mobs grade IV L knee flexion mob with tibial IR and ER grade IV  Prone quad stretch 30s 4x Support lunge 2x10 Stair step flexion stretch for home  Self scar and mobs for ROM, HEP updates  7/10  Recumbent bike L3 x15min PROM L knee STM to medial HS  STS from low table 2x10 staggered ( L posterior) Bridge on ball 3x10 (knee bent) 3sec hold 4 fwd eccentric step down 2x10 Grey leg press 40lbs 3x10 Knee ext machine 3x8 20lbs DL  SL balance on airex 69d 4x SL heel raise 2x15 Runner step up 8 2x10   7/1  Nu step lvl 5 6 min STS from low table 4x8  Bridge on ball 3x10 (knee bent) 3sec hold 4 lateral step down 3x10 single UE support Grey leg press 40lbs 3x10 Knee ext machine 3x8 20lbs DL  SL balance on airex 69d 4x SL heel raise 2x20   PATIENT EDUCATION:  Education details: surgical precautions and goals, anatomy, exercise progression, DOMS expectations, muscle firing,  envelope of function, HEP, POC  Person educated: Patient Education method: Explanation, Demonstration, and Handouts Education comprehension: verbalized understanding, returned demonstration, verbal cues required, and tactile cues required  HOME EXERCISE PROGRAM: Access Code: [email or web address] Programmer, applications)  URL: [email or web address] Date: 09/16/2023 Prepared by: Prentice Ellard Nan    ASSESSMENT:  CLINICAL IMPRESSION:  Patient has met 3/3 short term goals and 3/6 long term goals with ability to complete HEP and improvement in symptoms, strength, ROM, activity tolerance, gait, balance, and functional mobility. Remaining goals not met due to continued deficits in strength and functional mobility. Patient has made good  progress toward remaining goals and will work independently on HEP to improve remaining deficit. Discussed progressions with patient and educated on reassessment findings, POC, HEP, returning to PT if needed. Overall, she is doing very well. Patient discharged from PT at this time.    OBJECTIVE IMPAIRMENTS: Abnormal gait, decreased activity tolerance, decreased balance, decreased endurance, decreased mobility, difficulty walking, decreased ROM, decreased strength, impaired flexibility, improper body mechanics, and pain.   ACTIVITY LIMITATIONS: carrying, lifting, bending, standing, squatting, sleeping, stairs, transfers, bathing, hygiene/grooming, locomotion level, and caring for others  PARTICIPATION LIMITATIONS: meal prep, cleaning, laundry, shopping, community activity, occupation, and yard work  PERSONAL FACTORS: Fitness, Time since onset of injury/illness/exacerbation, and 3+ comorbidities: HTN, HLD, DM, idiopathic scoliosis are also affecting patient's functional outcome.   REHAB POTENTIAL: Good  CLINICAL DECISION MAKING: Stable/uncomplicated  EVALUATION COMPLEXITY: Low   GOALS: Goals reviewed with patient? Yes  SHORT TERM GOALS: Target date: 10/14/2023    Patient will be independent with HEP in order to improve functional outcomes. Baseline: Goal status: MET  2.  Patient will report at least 25% improvement in symptoms for improved quality of life. Baseline: Goal status: MET  3.  Patient will perform SLR without lag. Baseline:  Goal status: MET    LONG TERM GOALS: Target date: 12/09/2023    Patient will report at least 75% improvement in symptoms for improved quality of life. Baseline:  Goal status: MET  2.  Patient will improve LEFS score by at least 18 points in order to indicate improved tolerance to activity. Baseline:  Goal status: MET  3.  Patient will be able to squat without compensation in order to demonstrate improved LE strength. Baseline:  Goal status:  ongoing  4.  Patient will demonstrate full left knee ROM for improved ability to squat and navigate stairs. Baseline:  Goal status: MET  5.  Patient will be able to perform forward step down test without deviation in order to demonstrate improved LE strength and motor control.  Baseline:  Goal status: ongoing  6.  Patient will demonstrate grade of 5/5 MMT grade in all tested musculature as evidence of improved strength to assist with stair ambulation and gait.   Baseline:  Goal status: ongoing   PLAN:  PT FREQUENCY: 1-2x/week  PT DURATION: 12 weeks  PLANNED INTERVENTIONS: 97164- PT Re-evaluation, 97110-Therapeutic exercises, 97530- Therapeutic activity, 97112- Neuromuscular re-education, 97535- Self Care, [id number]- Manual therapy, [phone]- Gait training, [phone]- Orthotic Fit/training, [phone]- Canalith repositioning, [id number]- Aquatic Therapy, [phone]- Splinting, Patient/Family education, Balance training, Stair training, Taping, Dry Needling, Joint mobilization, Joint manipulation, Spinal manipulation, Spinal mobilization, Scar mobilization, and DME instructions.  PLAN FOR NEXT SESSION: debara Prentice GORMAN Delorse, PT 12/14/2023, 8:48 AM

## 2023-12-18 ENCOUNTER — Encounter (HOSPITAL_BASED_OUTPATIENT_CLINIC_OR_DEPARTMENT_OTHER)

## 2023-12-21 ENCOUNTER — Encounter (HOSPITAL_BASED_OUTPATIENT_CLINIC_OR_DEPARTMENT_OTHER): Admitting: Physical Therapy

## 2023-12-25 ENCOUNTER — Encounter (HOSPITAL_BASED_OUTPATIENT_CLINIC_OR_DEPARTMENT_OTHER)

## 2023-12-27 ENCOUNTER — Encounter (HOSPITAL_BASED_OUTPATIENT_CLINIC_OR_DEPARTMENT_OTHER): Admitting: Physical Therapy

## 2024-01-01 ENCOUNTER — Encounter: Payer: Self-pay | Admitting: Family Medicine

## 2024-01-01 ENCOUNTER — Other Ambulatory Visit (HOSPITAL_BASED_OUTPATIENT_CLINIC_OR_DEPARTMENT_OTHER): Payer: Self-pay

## 2024-01-01 ENCOUNTER — Other Ambulatory Visit: Payer: Self-pay

## 2024-01-01 ENCOUNTER — Ambulatory Visit (INDEPENDENT_AMBULATORY_CARE_PROVIDER_SITE_OTHER): Admitting: Family Medicine

## 2024-01-01 VITALS — BP 135/70 | HR 86 | Ht 64.0 in | Wt 193.1 lb

## 2024-01-01 DIAGNOSIS — E119 Type 2 diabetes mellitus without complications: Secondary | ICD-10-CM

## 2024-01-01 DIAGNOSIS — E538 Deficiency of other specified B group vitamins: Secondary | ICD-10-CM | POA: Diagnosis not present

## 2024-01-01 DIAGNOSIS — Z7985 Long-term (current) use of injectable non-insulin antidiabetic drugs: Secondary | ICD-10-CM

## 2024-01-01 DIAGNOSIS — I10 Essential (primary) hypertension: Secondary | ICD-10-CM | POA: Diagnosis not present

## 2024-01-01 DIAGNOSIS — R202 Paresthesia of skin: Secondary | ICD-10-CM

## 2024-01-01 DIAGNOSIS — F411 Generalized anxiety disorder: Secondary | ICD-10-CM

## 2024-01-01 DIAGNOSIS — Z7689 Persons encountering health services in other specified circumstances: Secondary | ICD-10-CM

## 2024-01-01 LAB — POCT GLYCOSYLATED HEMOGLOBIN (HGB A1C): Hemoglobin A1C: 5.6 % (ref 4.0–5.6)

## 2024-01-01 MED ORDER — PHENTERMINE HCL 15 MG PO CAPS
15.0000 mg | ORAL_CAPSULE | ORAL | 1 refills | Status: DC
  Filled 2024-01-01: qty 30, 30d supply, fill #0
  Filled 2024-02-05: qty 30, 30d supply, fill #1

## 2024-01-01 MED ORDER — VENLAFAXINE HCL ER 37.5 MG PO CP24
ORAL_CAPSULE | ORAL | 1 refills | Status: DC
  Filled 2024-01-01: qty 30, 30d supply, fill #0
  Filled 2024-01-22 – 2024-01-23 (×2): qty 30, 2d supply, fill #1
  Filled 2024-01-24: qty 30, 30d supply, fill #1
  Filled 2024-02-05: qty 30, fill #2

## 2024-01-01 MED ORDER — CYANOCOBALAMIN 1000 MCG/ML IJ SOLN
1000.0000 ug | Freq: Once | INTRAMUSCULAR | Status: AC
  Administered 2024-01-01: 1000 ug via INTRAMUSCULAR

## 2024-01-01 NOTE — Progress Notes (Signed)
 Established Patient Office Visit  Subjective  Patient ID: Megan [name], female    DOB: [birth year]  Age: 53 y.o. MRN: [id number]  Chief Complaint  Patient presents with   Diabetes   B12 Injection    HPI  Discussed the use of AI scribe software for clinical note transcription with the patient, who gave verbal consent to proceed.  History of Present Illness Megan [name] is a 53 year old female who presents with anxiety and gastrointestinal symptoms.  Anxiety and mood disturbance - Recurrence of anxiety characterized by increased worry and panic, particularly regarding her son and work responsibilities - Ruminates on issues and frequently checks on her son - Anxiety intensified after a recent incident involving her son's dog - Upcoming family trip in February contributing to anxiety due to concerns about her son's well-being - Anticipates worst-case scenarios, such as worrying about car accidents involving family members - Difficulty sleeping and concentrating, attributed to anxiety - Has not discussed these feelings with others but expresses a desire to regain control over her anxiety - Previously managed anxiety without medication but currently feels symptoms are becoming overwhelming  Gastrointestinal symptoms associated with anxiety - Frequent bathroom use during periods of stress, associated with anxiety - History of stress-induced gastrointestinal symptoms, particularly when anticipating stressful events or changes (e.g., recent desk relocation at work)  Peripheral neuropathy symptoms - Intermittent tingling and burning sensations in the legs, knees and feet, she thinks might be associated with birth control medication - Symptoms have intensified recently, affecting ability to tolerate shoes and causing nocturnal awakenings - Similar symptoms occurred in the past, previously thought to be related to copper  levels  Medication intolerance and history - History of sertraline   use for mood, discontinued due to nausea - Trial of another unspecified medication for mood, discontinued due to poor fit - Currently using Ozempic , with a recent A1c of 5.6  Pernicious Anemia  - Received a B12 injection  Weight management concerns - Expresses concern about weight - Considering options to address weight, including possible resumption of phentermine  - Recently cleared to start walking again after surgery      ROS    Objective:     BP 135/70   Pulse 86   Ht 5' 4 (1.626 m)   Wt 193 lb 0.8 oz (87.6 kg)   SpO2 100%   BMI 33.14 kg/m    Physical Exam Vitals and nursing note reviewed.  Constitutional:      Appearance: Normal appearance.  HENT:     Head: Normocephalic and atraumatic.  Eyes:     Conjunctiva/sclera: Conjunctivae normal.  Cardiovascular:     Rate and Rhythm: Normal rate and regular rhythm.  Pulmonary:     Effort: Pulmonary effort is normal.     Breath sounds: Normal breath sounds.  Skin:    General: Skin is warm and dry.  Neurological:     Mental Status: She is alert.  Psychiatric:        Mood and Affect: Mood normal.      Results for orders placed or performed in visit on 01/01/24  POCT HgB A1C  Result Value Ref Range   Hemoglobin A1C 5.6 4.0 - 5.6 %   HbA1c POC (<> result, manual entry)     HbA1c, POC (prediabetic range)     HbA1c, POC (controlled diabetic range)        The 10-year ASCVD risk score (Arnett DK, et al., 2019) is: 3.3%    Assessment &  Plan:   Problem List Items Addressed This Visit       Cardiovascular and Mediastinum   Essential hypertension, benign     Endocrine   Diabetes type 2, controlled (HCC) - Primary   Relevant Orders   POCT HgB A1C (Completed)     Other   Vitamin B12 deficiency   Encounter for weight management   Add phentermine  short term. She has no been released for exercise.  So she will plan to start doing that as well continue to work on healthy food choices.      Relevant  Medications   phentermine  15 MG capsule   Other Visit Diagnoses       GAD (generalized anxiety disorder)       Relevant Medications   venlafaxine  XR (EFFEXOR  XR) 37.5 MG 24 hr capsule     Paresthesia of bilateral legs       Relevant Orders   Ambulatory referral to Neurology      Assessment and Plan Assessment & Plan Type 2 diabetes mellitus A1c well-controlled at 5.6 with current Ozempic  regimen. - Continue Ozempic  as prescribed.  Generalized anxiety disorder Anxiety symptoms returned. Venlafaxine  considered due to previous medication intolerance and preference for medication over therapy. - Prescribe venlafaxine  (Effexor ).  Obesity Weight management needed. Mounjaro considered but insurance coverage uncertain. Phentermine  considered for short-term use. Encouraged walking. - Prescribe phentermine  for short-term use. - Encourage walking as tolerated.  Peripheral paresthesia, intermittent Intermittent tingling and burning in feet, possibly related to birth control. Neurology referral considered if symptoms persist. - Refer to neurology for further evaluation if symptoms persist.  Hypertension Slightly elevated blood pressure noted. Re-evaluation planned. - Recheck blood pressure.  General Health Maintenance Due for eye exam and mammogram. Pneumonia vaccine recommended for those over 50. - Schedule eye exam. - Schedule mammogram. - Consider pneumonia vaccine.   Return in about 6 weeks (around 02/12/2024) for Mood, New start medication.    Dorothyann Byars, MD

## 2024-01-01 NOTE — Progress Notes (Signed)
 Pt came in today for B12 injection.   Injection  tolerated well. Given in LD   Pt reports no negative side effects from medication.   Denies any dizziness, chest pain or palpitations, and no GI problems. She will RTC in 30 days for next injection

## 2024-01-01 NOTE — Assessment & Plan Note (Signed)
 Add phentermine  short term. She has no been released for exercise.  So she will plan to start doing that as well continue to work on healthy food choices.

## 2024-01-03 ENCOUNTER — Encounter (HOSPITAL_BASED_OUTPATIENT_CLINIC_OR_DEPARTMENT_OTHER)

## 2024-01-05 ENCOUNTER — Encounter (HOSPITAL_BASED_OUTPATIENT_CLINIC_OR_DEPARTMENT_OTHER)

## 2024-01-09 ENCOUNTER — Encounter: Payer: Self-pay | Admitting: Sports Medicine

## 2024-01-22 ENCOUNTER — Other Ambulatory Visit (HOSPITAL_BASED_OUTPATIENT_CLINIC_OR_DEPARTMENT_OTHER): Payer: Self-pay

## 2024-01-23 ENCOUNTER — Other Ambulatory Visit (HOSPITAL_BASED_OUTPATIENT_CLINIC_OR_DEPARTMENT_OTHER): Payer: Self-pay

## 2024-01-24 ENCOUNTER — Other Ambulatory Visit (HOSPITAL_BASED_OUTPATIENT_CLINIC_OR_DEPARTMENT_OTHER): Payer: Self-pay

## 2024-02-01 ENCOUNTER — Ambulatory Visit (INDEPENDENT_AMBULATORY_CARE_PROVIDER_SITE_OTHER)

## 2024-02-01 VITALS — BP 123/71 | HR 83 | Resp 17 | Ht 64.0 in

## 2024-02-01 DIAGNOSIS — E538 Deficiency of other specified B group vitamins: Secondary | ICD-10-CM

## 2024-02-01 MED ORDER — CYANOCOBALAMIN 1000 MCG/ML IJ SOLN
1000.0000 ug | INTRAMUSCULAR | Status: DC
  Administered 2024-02-01 – 2024-05-30 (×5): 1000 ug via INTRAMUSCULAR

## 2024-02-01 NOTE — Addendum Note (Signed)
 Addended by: Micael Barb D on: 02/01/2024 01:28 PM   Modules accepted: Level of Service

## 2024-02-01 NOTE — Progress Notes (Signed)
 Pt here for her B12 injection. Pt denies CP, SOB, headache, or medication issues. Pt comes every for her injection. Given in her RD. Tolerated well no redness or swelling noted at the site. Pt advised to RTC in 30 days, for B12 injection.

## 2024-02-05 ENCOUNTER — Other Ambulatory Visit: Payer: Self-pay

## 2024-02-05 ENCOUNTER — Other Ambulatory Visit (HOSPITAL_BASED_OUTPATIENT_CLINIC_OR_DEPARTMENT_OTHER): Payer: Self-pay

## 2024-02-06 ENCOUNTER — Other Ambulatory Visit (HOSPITAL_BASED_OUTPATIENT_CLINIC_OR_DEPARTMENT_OTHER): Payer: Self-pay

## 2024-02-06 ENCOUNTER — Other Ambulatory Visit: Payer: Self-pay | Admitting: Family Medicine

## 2024-02-06 ENCOUNTER — Encounter: Payer: Self-pay | Admitting: Family Medicine

## 2024-02-06 DIAGNOSIS — F411 Generalized anxiety disorder: Secondary | ICD-10-CM

## 2024-02-06 MED ORDER — VENLAFAXINE HCL ER 75 MG PO CP24
75.0000 mg | ORAL_CAPSULE | Freq: Every day | ORAL | 1 refills | Status: DC
  Filled 2024-02-06: qty 90, 90d supply, fill #0
  Filled 2024-04-17: qty 90, 90d supply, fill #1

## 2024-02-12 ENCOUNTER — Encounter: Payer: Self-pay | Admitting: Family Medicine

## 2024-02-12 ENCOUNTER — Other Ambulatory Visit (HOSPITAL_COMMUNITY): Payer: Self-pay

## 2024-02-12 ENCOUNTER — Ambulatory Visit (INDEPENDENT_AMBULATORY_CARE_PROVIDER_SITE_OTHER): Admitting: Family Medicine

## 2024-02-12 VITALS — BP 115/71 | HR 81 | Temp 97.8°F | Resp 18 | Ht 64.0 in | Wt 194.9 lb

## 2024-02-12 DIAGNOSIS — Z7184 Encounter for health counseling related to travel: Secondary | ICD-10-CM | POA: Diagnosis not present

## 2024-02-12 DIAGNOSIS — Z7689 Persons encountering health services in other specified circumstances: Secondary | ICD-10-CM

## 2024-02-12 DIAGNOSIS — I10 Essential (primary) hypertension: Secondary | ICD-10-CM | POA: Diagnosis not present

## 2024-02-12 DIAGNOSIS — R790 Abnormal level of blood mineral: Secondary | ICD-10-CM

## 2024-02-12 DIAGNOSIS — F411 Generalized anxiety disorder: Secondary | ICD-10-CM | POA: Diagnosis not present

## 2024-02-12 MED ORDER — PHENTERMINE HCL 15 MG PO CAPS
15.0000 mg | ORAL_CAPSULE | ORAL | 1 refills | Status: AC
  Filled 2024-02-12 – 2024-03-07 (×3): qty 30, 30d supply, fill #0
  Filled 2024-04-17: qty 30, 30d supply, fill #1

## 2024-02-12 NOTE — Addendum Note (Signed)
 Addended by: Brittnei Jagiello D on: 02/12/2024 01:12 PM   Modules accepted: Orders

## 2024-02-12 NOTE — Assessment & Plan Note (Signed)
 Generalized anxiety disorder Increased anxiety due to upcoming work trip. Current Venlafaxine  XR 37.5 mg somewhat effective. - PHQ score of 11 today.  - Continue Venlafaxine  XR 37.5 mg daily. - Consider increasing dosage if no improvement in a few weeks. - Contact via MyChart for medication adjustment.

## 2024-02-12 NOTE — Assessment & Plan Note (Signed)
 Essential hypertension Benign essential hypertension. Monitoring crucial due to Phentermine  use. - Monitor blood pressure regularly.

## 2024-02-12 NOTE — Progress Notes (Addendum)
 Established Patient Office Visit  Subjective  Patient ID: Megan [name], female    DOB: 13-Mar-1971  Age: 53 y.o. MRN: [id number]  Chief Complaint  Patient presents with   follow up    Patient states that she is here for a follow     HPI   Discussed the use of AI scribe software for clinical note transcription with the patient, who gave verbal consent to proceed.  History of Present Illness Megan [name] is a 53 year old female who presents for follow-up on anxiety management and medication review.  Anxiety symptoms - Currently taking Effexor  for anxiety with partial efficacy; effect described as 'so so' - Increased anxiety related to upcoming work trip to [place] from November 1st to November 21st, involving a long flight and working night shifts to align with home office hours - Particular anxiety about flying due to ear issues  Appetite suppression and weight management - Currently taking phentermine  with satisfactory response - Desires to continue phentermine , especially with upcoming travel and holiday season - Recent weekend trip with her sister during which she did not adhere to usual eating habits  Contraceptive management - Using copper  IUD for contraception - Plans to continue copper  IUD until after upcoming travel and holidays - Follow-up for reassessment of contraceptive needs scheduled for April  Preventive health maintenance - Mammogram not yet scheduled but discussed with gynecologist - Needs to reschedule eye exam due to conflict with travel plans     ROS    Objective:     BP 115/71   Pulse 81   Temp 97.8 F (36.6 C) (Oral)   Resp 18   Ht 5' 4 (1.626 m)   Wt 194 lb 14.4 oz (88.4 kg)   SpO2 99%   BMI 33.45 kg/m    Physical Exam Vitals and nursing note reviewed.  Constitutional:      Appearance: Normal appearance.  HENT:     Head: Normocephalic and atraumatic.  Eyes:     Conjunctiva/sclera: Conjunctivae normal.  Cardiovascular:     Rate  and Rhythm: Normal rate and regular rhythm.  Pulmonary:     Effort: Pulmonary effort is normal.     Breath sounds: Normal breath sounds.  Skin:    General: Skin is warm and dry.  Neurological:     Mental Status: She is alert.  Psychiatric:        Mood and Affect: Mood normal.      No results found for any visits on 02/12/24.    The 10-year ASCVD risk score (Arnett DK, et al., 2019) is: 2.4%    Assessment & Plan:   Problem List Items Addressed This Visit       Cardiovascular and Mediastinum   Essential hypertension, benign   Essential hypertension Benign essential hypertension. Monitoring crucial due to Phentermine  use. - Monitor blood pressure regularly.        Other   High serum copper  level   Gyn says most likely from COpper  T IUD       GAD (generalized anxiety disorder) - Primary   Generalized anxiety disorder Increased anxiety due to upcoming work trip. Current Venlafaxine  XR 37.5 mg somewhat effective. - PHQ score of 11 today.  - Continue Venlafaxine  XR 37.5 mg daily. - Consider increasing dosage if no improvement in a few weeks. - Contact via MyChart for medication adjustment.      Encounter for weight management   Weight mgt  Managing with Phentermine  15 mg. Some  success reported, desires continuation during travel and holidays. - Continue Phentermine  15 mg short term. - Encourage portion control and healthy food choices. - Encourage regular physical activity. - Monitor blood pressure regularly.      Relevant Medications   phentermine  15 MG capsule   Other Visit Diagnoses       Travel advice encounter           Assessment and Plan Assessment & Plan        Return in about 3 months (around 05/14/2024) for Mood, Weight check .    Dorothyann Byars, MD

## 2024-02-12 NOTE — Assessment & Plan Note (Signed)
 Weight mgt  Managing with Phentermine  15 mg. Some success reported, desires continuation during travel and holidays. - Continue Phentermine  15 mg short term. - Encourage portion control and healthy food choices. - Encourage regular physical activity. - Monitor blood pressure regularly.

## 2024-02-12 NOTE — Assessment & Plan Note (Signed)
 Gyn says most likely from COpper  T IUD

## 2024-02-13 ENCOUNTER — Other Ambulatory Visit (HOSPITAL_COMMUNITY): Payer: Self-pay

## 2024-02-19 ENCOUNTER — Encounter: Payer: Self-pay | Admitting: Family Medicine

## 2024-02-19 DIAGNOSIS — H6993 Unspecified Eustachian tube disorder, bilateral: Secondary | ICD-10-CM

## 2024-02-19 DIAGNOSIS — F40243 Fear of flying: Secondary | ICD-10-CM

## 2024-02-19 MED ORDER — ALPRAZOLAM 0.5 MG PO TABS
0.2500 mg | ORAL_TABLET | Freq: Two times a day (BID) | ORAL | 0 refills | Status: AC | PRN
  Filled 2024-02-19: qty 10, 5d supply, fill #0

## 2024-02-19 MED ORDER — FLUTICASONE PROPIONATE 50 MCG/ACT NA SUSP
2.0000 | Freq: Every day | NASAL | 6 refills | Status: AC
  Filled 2024-02-19: qty 16, 30d supply, fill #0
  Filled 2024-06-10: qty 16, 30d supply, fill #1

## 2024-02-20 ENCOUNTER — Other Ambulatory Visit (HOSPITAL_BASED_OUTPATIENT_CLINIC_OR_DEPARTMENT_OTHER): Payer: Self-pay

## 2024-02-23 ENCOUNTER — Other Ambulatory Visit (HOSPITAL_BASED_OUTPATIENT_CLINIC_OR_DEPARTMENT_OTHER): Payer: Self-pay

## 2024-02-29 ENCOUNTER — Ambulatory Visit (INDEPENDENT_AMBULATORY_CARE_PROVIDER_SITE_OTHER): Admitting: Orthopaedic Surgery

## 2024-02-29 DIAGNOSIS — M25362 Other instability, left knee: Secondary | ICD-10-CM | POA: Diagnosis not present

## 2024-02-29 NOTE — Progress Notes (Signed)
 Post Operative Evaluation    Procedure/Date of Surgery: Left knee Maci  placement with MPFL reconstruction 09/12/23  Interval History:   Presents today post above procedure overall doing extremely well.  She occasionally just has some minor tightness going up and down stairs but overall doing extremely well  PMH/PSH/Family History/Social History/Meds/Allergies:    Past Medical History:  Diagnosis Date   Gestational diabetes mellitus in childbirth    Hypertension    Past Surgical History:  Procedure Laterality Date   IMPLANTATION, MATRIX-INDUCED AUTOLOGOUS CHONDROCYTES Left 09/12/2023   Procedure: IMPLANTATION, MATRIX-INDUCED AUTOLOGOUS CHONDROCYTES;  Surgeon: Genelle Standing, MD;  Location: [facility];  Service: Orthopedics;  Laterality: Left;   KNEE ARTHROSCOPY WITH MACI  CARTILAGE HARVEST Left 07/24/2023   Procedure: ARTHROSCOPY, KNEE, WITH CARTILAGE PROCUREMENT FOR CULTURE;  Surgeon: Genelle Standing, MD;  Location: [facility];  Service: Orthopedics;  Laterality: Left;  LEFT KNEE ARTHROSCOPY WITH MACI  HARVEST   MEDIAL PATELLOFEMORAL LIGAMENT REPAIR Left 09/12/2023   Procedure: RECONSTRUCTION, LIGAMENT, MEDIAL PATELLOFEMORAL;  Surgeon: Genelle Standing, MD;  Location: [facility];  Service: Orthopedics;  Laterality: Left;  LEFT KNEE MEDIAL PATELLOFEMORAL LIGAMENT RECONSTRUCTION ANDMATRIX ASSOCIATED CHONDROCYTES IMPLANTATION (PATELLA AND FEMUR)   TONSILLECTOMY AND ADENOIDECTOMY     TYMPANOSTOMY TUBE PLACEMENT  1984   Social History   Socioeconomic History   Marital status: Single    Spouse name: Dyanna Seiter   Number of children: Not on file   Years of education: Not on file   Highest education level: Associate degree: occupational, Scientist, product/process development, or vocational program  Occupational History   Occupation: Asst Diplomatic Services operational officer    Comment: [employer]   Tobacco Use   Smoking status: Never   Smokeless tobacco:  Never  Vaping Use   Vaping status: Never Used  Substance and Sexual Activity   Alcohol use: No   Drug use: No   Sexual activity: Yes    Partners: Male  Other Topics Concern   Not on file  Social History Narrative   2 caffeine  drinks per day. No regular exercise. She works full-time and is going to school.   Social Drivers of Corporate investment banker Strain: Low Risk  (02/16/2024)   Received from [facility]   Overall Financial Resource Strain (CARDIA)    How hard is it for you to pay for the very basics like food, housing, medical care, and heating?: Not hard at all  Food Insecurity: No Food Insecurity (02/16/2024)   Received from [facility]   Hunger Vital Sign    Within the past 12 months, you worried that your food would run out before you got the money to buy more.: Never true    Within the past 12 months, the food you bought just didn't last and you didn't have money to get more.: Never true  Transportation Needs: No Transportation Needs (02/16/2024)   Received from [facility] - Transportation    In the past 12 months, has lack of transportation kept you from medical appointments or from getting medications?: No    In the past 12 months, has lack of transportation kept you from meetings, work, or from getting things needed for daily living?: No  Physical Activity: Sufficiently Active (02/16/2024)   Received from [facility]   Exercise Vital Sign  On average, how many days per week do you engage in moderate to strenuous exercise (like a brisk walk)?: 5 days    On average, how many minutes do you engage in exercise at this level?: 60 min  Recent Concern: Physical Activity - Insufficiently Active (12/28/2023)   Exercise Vital Sign    Days of Exercise per Week: 2 days    Minutes of Exercise per Session: 60 min  Stress: Stress Concern Present (02/16/2024)   Received from [facility] - Occupational Stress  Questionnaire    Do you feel stress - tense, restless, nervous, or anxious, or unable to sleep at night because your mind is troubled all the time - these days?: Very much  Social Connections: Moderately Integrated (02/16/2024)   Received from [facility]   Social Network    How would you rate your social network (family, work, friends)?: Adequate participation with social networks  Recent Concern: Social Connections - Socially Isolated (12/28/2023)   Social Connection and Isolation Panel    Frequency of Communication with Friends and Family: Once a week    Frequency of Social Gatherings with Friends and Family: Once a week    Attends Religious Services: Never    Database administrator or Organizations: No    Attends Engineer, structural: Not on file    Marital Status: Never married   Family History  Problem Relation Age of Onset   Hypertension Mother    Celiac disease Mother    Hypertension Father    Hypertension Sister    Hypertension Brother    Peripheral vascular disease Maternal Grandmother    Diabetes Paternal Aunt    Diabetes Maternal Uncle    Diabetes Paternal Aunt    Colon cancer Maternal Grandfather    Crohn's disease Cousin    Allergies  Allergen Reactions   Metformin  And Related Other (See Comments)    GI upset    Sertraline  Nausea Only    sweats   Current Outpatient Medications  Medication Sig Dispense Refill   ALPRAZolam (XANAX) 0.5 MG tablet Take 0.5-1 tablets (0.25-0.5 mg total) by mouth 2 (two) times daily as needed for anxiety. With flying 10 tablet 0   AMBULATORY NON FORMULARY MEDICATION One touch ultra 2 glucose system  Use to test once daily as directed  Please schedule f/u appt 1 each 0   amLODipine  (NORVASC ) 5 MG tablet TAKE 1 TABLET BY MOUTH DAILY 90 tablet 3   atorvastatin  (LIPITOR) 40 MG tablet Take 1 tablet (40 mg total) by mouth daily. 90 tablet 3   fluticasone  (FLONASE ) 50 MCG/ACT nasal spray Place 2 sprays into both nostrils daily.  16 g 6   losartan -hydrochlorothiazide  (HYZAAR) 100-12.5 MG tablet TAKE 1 TABLET BY MOUTH DAILY 90 tablet 3   norgestrel-ethinyl estradiol (LO/OVRAL,CRYSELLE) 0.3-30 MG-MCG tablet Take 1 tablet by mouth daily.     phentermine  15 MG capsule Take 1 capsule (15 mg total) by mouth every morning. 30 capsule 1   Semaglutide , 2 MG/DOSE, (OZEMPIC , 2 MG/DOSE,) 8 MG/3ML SOPN Inject 2 mg (0.75 ml) into the skin once a week. 9 mL 2   venlafaxine  XR (EFFEXOR  XR) 75 MG 24 hr capsule Take 1 capsule (75 mg total) by mouth daily with breakfast. 90 capsule 1   Current Facility-Administered Medications  Medication Dose Route Frequency Provider Last Rate Last Admin   cyanocobalamin  (VITAMIN B12) injection 1,000 mcg  1,000 mcg Intramuscular Q30 days Alvan Dorothyann BIRCH, MD  1,000 mcg at 02/01/24 0826   No results found.  Review of Systems:   A ROS was performed including pertinent positives and negatives as documented in the HPI.   Musculoskeletal Exam:    There were no vitals taken for this visit.  Left knee incisions are well-appearing without erythema or drainage.  There is no lateral laxity with testing of the patella in extension.  Range of motion is from 0 to 35 degrees.  Trace effusion  Imaging:      I personally reviewed and interpreted the radiographs.   Assessment:   Status post left knee MPFL reconstruction with Maci  placement overall doing extremely well.  I will plan to see her back as needed Plan :    - Return to clinic as needed       I personally saw and evaluated the patient, and participated in the management and treatment plan.  Elspeth Parker, MD Attending Physician, Orthopedic Surgery  This document was dictated using Dragon voice recognition software. A reasonable attempt at proof reading has been made to minimize errors.

## 2024-03-01 NOTE — Progress Notes (Signed)
 Patient is here for her monthly B12 injection. Pt denies muscle cramps, weakness GI, or irregular heart rate.  Patient given injection in her LD. Tolerated well no redness or swelling noted at the site. Patient advised to RTC in 30 days for next injection.

## 2024-03-04 ENCOUNTER — Ambulatory Visit

## 2024-03-04 VITALS — BP 124/65 | HR 86 | Ht 64.0 in

## 2024-03-04 DIAGNOSIS — E538 Deficiency of other specified B group vitamins: Secondary | ICD-10-CM

## 2024-03-04 NOTE — Progress Notes (Signed)
   Established Patient Office Visit  Subjective   Patient ID: Megan [name], female    DOB: 1970/06/06  Age: 53 y.o. MRN: [id number]  Chief Complaint  Patient presents with   vit B12 deficiency    Vit B12 injection nurse visit.    HPI  Vitamin B 12 deficiency- Vitamin B12 injection as nurse visit.  Patient denies irregular heart rate, weakness, GI symptoms or problems with medication.   ROS    Objective:     BP 124/65   Pulse 86   Ht 5' 4 (1.626 m)   SpO2 100%   BMI 33.45 kg/m    Physical Exam   No results found for any visits on 03/04/24.    The 10-year ASCVD risk score (Arnett DK, et al., 2019) is: 2.8%    Assessment & Plan:  B12 injection nurse visit. Admin cyanocobalamin  1,014mcg IM left deltoid. Patient tolerated injection well without complications. Return in 30 days for next B12 injection.  Problem List Items Addressed This Visit       Other   Vitamin B12 deficiency - Primary    Return in about 30 days (around 04/03/2024) for nurse visit for B12 injections .    Suzen SHAUNNA Plenty, LPN

## 2024-03-04 NOTE — Patient Instructions (Signed)
Return in 30 days for next B12 injection as nurse visit.

## 2024-03-07 ENCOUNTER — Other Ambulatory Visit (HOSPITAL_BASED_OUTPATIENT_CLINIC_OR_DEPARTMENT_OTHER): Payer: Self-pay

## 2024-03-11 ENCOUNTER — Encounter: Payer: Self-pay | Admitting: Radiology

## 2024-04-01 ENCOUNTER — Ambulatory Visit

## 2024-04-01 DIAGNOSIS — E538 Deficiency of other specified B group vitamins: Secondary | ICD-10-CM

## 2024-04-01 NOTE — Progress Notes (Signed)
   Subjective:    Patient ID: Megan [name], female    DOB: 1970-10-23, 53 y.o.   MRN: [id number]  HPI  Patient is here for a B12 injection. denies irregular heart rate, weakness, muscle cramps or GI problems.   Review of Systems     Objective:   Physical Exam        Assessment & Plan:   Patient given injection in her in her RD. Tolerated well no redness or swelling noted at the site. Pt advised to RTC in 30 days for next injection. (Around 04/30/24)

## 2024-04-01 NOTE — Patient Instructions (Signed)
Return in 30 days for next B12 injection as nurse visit.

## 2024-04-01 NOTE — Progress Notes (Signed)
   Established Patient Office Visit  Subjective   Patient ID: Megan [name], female    DOB: 1971/05/02  Age: 53 y.o. MRN: [id number]  Chief Complaint  Patient presents with   B12 deficiency    Vitamin B12 administration - nurse visit.     HPI  Vitamin B12 deficiency.  Vitaming B12 administration. Last given 03/04/2024 . Patient declines flu shot . Patient has just returned from trip to [place] .   ROS    Objective:     BP 135/68   Pulse 79   Ht 5' 4 (1.626 m)   SpO2 100%   BMI 33.45 kg/m    Physical Exam   No results found for any visits on 04/01/24.    The 10-year ASCVD risk score (Arnett DK, et al., 2019) is: 3.3%    Assessment & Plan:  Cyanocobalamin  1,000mg  IM Right Deltoid . Patient tolerated injection well without complications. Return in 30 days for next B12 injection  as nurse visit.    Problem List Items Addressed This Visit   None   Return in about 1 month (around 05/01/2024) for nurse visit for B12 injection. SABRA Suzen SHAUNNA Alpheus, LPN

## 2024-04-11 ENCOUNTER — Other Ambulatory Visit: Payer: Self-pay | Admitting: Family Medicine

## 2024-04-11 DIAGNOSIS — I10 Essential (primary) hypertension: Secondary | ICD-10-CM

## 2024-04-17 ENCOUNTER — Other Ambulatory Visit: Payer: Self-pay | Admitting: Family Medicine

## 2024-04-17 DIAGNOSIS — E119 Type 2 diabetes mellitus without complications: Secondary | ICD-10-CM

## 2024-04-18 ENCOUNTER — Other Ambulatory Visit: Payer: Self-pay

## 2024-04-18 ENCOUNTER — Other Ambulatory Visit (HOSPITAL_BASED_OUTPATIENT_CLINIC_OR_DEPARTMENT_OTHER): Payer: Self-pay

## 2024-04-18 MED ORDER — OZEMPIC (2 MG/DOSE) 8 MG/3ML ~~LOC~~ SOPN
2.0000 mg | PEN_INJECTOR | SUBCUTANEOUS | 0 refills | Status: DC
  Filled 2024-04-18: qty 9, 84d supply, fill #0

## 2024-04-29 ENCOUNTER — Ambulatory Visit

## 2024-04-29 VITALS — BP 128/69 | HR 73 | Resp 18 | Ht 64.0 in | Wt 194.0 lb

## 2024-04-29 DIAGNOSIS — E538 Deficiency of other specified B group vitamins: Secondary | ICD-10-CM

## 2024-04-29 NOTE — Progress Notes (Signed)
" ° °  Subjective:    Patient ID: Megan [name], female    DOB: [birth year], 53 y.o.   MRN: [id number]  HPI  Patient is here for her B12 injection. Denies muscle cramps, weakness, medication changes, CP, SOB, or headache  Review of Systems     Objective:   Physical Exam        Assessment & Plan:   Given in her LD. Tolerated well no redness or swelling noted at the site. Pt advised to RTC in 30 days (05/29/24) for B12 injection. "

## 2024-05-13 ENCOUNTER — Encounter: Payer: Self-pay | Admitting: Family Medicine

## 2024-05-13 ENCOUNTER — Ambulatory Visit: Admitting: Family Medicine

## 2024-05-13 VITALS — BP 125/73 | HR 88 | Ht 64.0 in | Wt 193.0 lb

## 2024-05-13 DIAGNOSIS — Z7985 Long-term (current) use of injectable non-insulin antidiabetic drugs: Secondary | ICD-10-CM | POA: Diagnosis not present

## 2024-05-13 DIAGNOSIS — L209 Atopic dermatitis, unspecified: Secondary | ICD-10-CM | POA: Diagnosis not present

## 2024-05-13 DIAGNOSIS — E538 Deficiency of other specified B group vitamins: Secondary | ICD-10-CM | POA: Diagnosis not present

## 2024-05-13 DIAGNOSIS — Z7689 Persons encountering health services in other specified circumstances: Secondary | ICD-10-CM | POA: Diagnosis not present

## 2024-05-13 DIAGNOSIS — E119 Type 2 diabetes mellitus without complications: Secondary | ICD-10-CM | POA: Diagnosis not present

## 2024-05-13 DIAGNOSIS — I10 Essential (primary) hypertension: Secondary | ICD-10-CM | POA: Diagnosis not present

## 2024-05-13 DIAGNOSIS — F411 Generalized anxiety disorder: Secondary | ICD-10-CM

## 2024-05-13 DIAGNOSIS — K580 Irritable bowel syndrome with diarrhea: Secondary | ICD-10-CM | POA: Insufficient documentation

## 2024-05-13 MED ORDER — PIMECROLIMUS 1 % EX CREA: TOPICAL_CREAM | Freq: Two times a day (BID) | CUTANEOUS | 3 refills | Status: DC

## 2024-05-13 MED ORDER — AMITRIPTYLINE HCL 10 MG PO TABS: 10.0000 mg | ORAL_TABLET | Freq: Every day | ORAL | 0 refills | Status: AC

## 2024-05-13 NOTE — Assessment & Plan Note (Signed)
 Vitamin B12 deficiency B12 deficiency may contribute to IBS symptoms. Previous levels were low-normal. - Order blood work to recheck B12 levels. - Continue monthly B12 injections.

## 2024-05-13 NOTE — Assessment & Plan Note (Signed)
 Doing well on Ozempic .  A1C at goal.

## 2024-05-13 NOTE — Assessment & Plan Note (Signed)
" °  Essential hypertension Current medication contains a diuretic, possibly contributing to skin dryness and itching. - Continue current blood pressure medication regimen.  "

## 2024-05-13 NOTE — Assessment & Plan Note (Signed)
 Generalized anxiety disorder Anxiety may exacerbate IBS symptoms. Current venlafaxine  may be insufficient, especially in high-stress situations. - Continue venlafaxine  XR 37.5 mg daily. - Evaluate amitriptyline 's effectiveness for anxiety-related IBS symptoms.

## 2024-05-13 NOTE — Progress Notes (Signed)
 "  Established Patient Office Visit  Patient ID: Megan [name], female    DOB: 1971-04-18  Age: 54 y.o. MRN: [id number] PCP: Alvan Dorothyann BIRCH, MD  Chief Complaint  Patient presents with   mood   Weight Check    Subjective:     HPI  Discussed the use of AI scribe software for clinical note transcription with the patient, who gave verbal consent to proceed.  History of Present Illness Megan [name] is a 54 year old female with IBS who presents with gastrointestinal symptoms and anxiety.  Gastrointestinal symptoms - Ongoing symptoms for approximately five years, with worsening severity over the past year - Symptoms include cramping and urgency, even after consuming small, non-spicy meals such as rotisserie chicken and plain mashed potatoes - Salads and certain foods trigger symptoms - Colonoscopy and endoscopy revealed a benign polyp - No significant swelling in the legs, but some puffiness in the left leg attributed to prior knee surgery - Prescribed dicyclomine for IBS symptoms but has not started this medication  Anxiety - Takes venlafaxine  for anxiety - Venlafaxine  is not effective, especially in high-stress situations such as travel or work meetings - Anxiety is significant and believed to exacerbate gastrointestinal symptoms  Pruritus of lower extremities - Severe itching from the knees down, persisting for six to seven months - Uses hydrocortisone cream daily, but it is becoming less effective - Has tried changing laundry detergent and moisturizing with Eucerin and Cerave creams without relief  Vitamin b12 deficiency - Receives monthly B12 injections - B12 levels are normal but on the lower side at last check     ROS    Objective:     BP 125/73   Pulse 88   Ht 5' 4 (1.626 m)   Wt 193 lb (87.5 kg)   SpO2 97%   BMI 33.13 kg/m    Physical Exam   No results found for any visits on 05/13/24.    The 10-year ASCVD risk score (Arnett DK, et al.,  2019) is: 2.9%    Assessment & Plan:   Problem List Items Addressed This Visit       Cardiovascular and Mediastinum   Essential hypertension, benign    Essential hypertension Current medication contains a diuretic, possibly contributing to skin dryness and itching. - Continue current blood pressure medication regimen.       Relevant Orders   CMP14+EGFR     Digestive   Irritable bowel syndrome with diarrhea   Relevant Medications   amitriptyline  (ELAVIL ) 10 MG tablet     Endocrine   Diabetes type 2, controlled (HCC) - Primary   Doing well on Ozempic .  A1C at goal.       Relevant Orders   CMP14+EGFR   Hemoglobin A1c     Other   Vitamin B12 deficiency   Vitamin B12 deficiency B12 deficiency may contribute to IBS symptoms. Previous levels were low-normal. - Order blood work to recheck B12 levels. - Continue monthly B12 injections.      Relevant Orders   B12   GAD (generalized anxiety disorder)   Generalized anxiety disorder Anxiety may exacerbate IBS symptoms. Current venlafaxine  may be insufficient, especially in high-stress situations. - Continue venlafaxine  XR 37.5 mg daily. - Evaluate amitriptyline 's effectiveness for anxiety-related IBS symptoms.      Relevant Medications   amitriptyline  (ELAVIL ) 10 MG tablet   Encounter for weight management   Other Visit Diagnoses       Atopic dermatitis, unspecified type  Relevant Medications   pimecrolimus  (ELIDEL ) 1 % cream       Assessment and Plan Assessment & Plan Irritable bowel syndrome Chronic IBS exacerbated by anxiety, with recent tests ruling out other conditions. Symptoms worsened over the past year, likely due to anxiety. - Start dicyclomine as needed for cramping and urgency. - Consider daily dicyclomine for one month to assess effectiveness. - Initiate low-dose amitriptyline  at bedtime. - Monitor symptoms over six weeks. - Identify and avoid dietary triggers, such as salads.  Chronic  pruritus of lower extremities Chronic itching from knees down, possibly due to dryness from medications and environment. Hydrocortisone cream less effective. - Prescribe nonsteroidal topical treatment such as Elidel  or Eucrisa. - Alternate hydrocortisone with nonsteroidal treatment. - Monitor for improvement and effectiveness.   .    Return in about 6 weeks (around 06/24/2024) for IBS.    Dorothyann Byars, MD [facility] at [facility]   "

## 2024-05-14 ENCOUNTER — Ambulatory Visit: Payer: Self-pay | Admitting: Family Medicine

## 2024-05-14 ENCOUNTER — Telehealth: Payer: Self-pay

## 2024-05-14 ENCOUNTER — Other Ambulatory Visit (HOSPITAL_COMMUNITY): Payer: Self-pay

## 2024-05-14 LAB — VITAMIN B12: Vitamin B-12: 580 pg/mL (ref 232–1245)

## 2024-05-14 LAB — CMP14+EGFR
ALT: 19 IU/L (ref 0–32)
AST: 19 IU/L (ref 0–40)
Albumin: 3.8 g/dL (ref 3.8–4.9)
Alkaline Phosphatase: 88 IU/L (ref 49–135)
BUN/Creatinine Ratio: 18 (ref 9–23)
BUN: 15 mg/dL (ref 6–24)
Bilirubin Total: 0.3 mg/dL (ref 0.0–1.2)
CO2: 22 mmol/L (ref 20–29)
Calcium: 8.8 mg/dL (ref 8.7–10.2)
Chloride: 106 mmol/L (ref 96–106)
Creatinine, Ser: 0.82 mg/dL (ref 0.57–1.00)
Globulin, Total: 2 g/dL (ref 1.5–4.5)
Glucose: 87 mg/dL (ref 70–99)
Potassium: 4.3 mmol/L (ref 3.5–5.2)
Sodium: 143 mmol/L (ref 134–144)
Total Protein: 5.8 g/dL — ABNORMAL LOW (ref 6.0–8.5)
eGFR: 87 mL/min/1.73

## 2024-05-14 LAB — HEMOGLOBIN A1C
Est. average glucose Bld gHb Est-mCnc: 114 mg/dL
Hgb A1c MFr Bld: 5.6 % (ref 4.8–5.6)

## 2024-05-14 LAB — SPECIMEN STATUS REPORT

## 2024-05-14 NOTE — Progress Notes (Signed)
 Hi Megan [name], protein stores are a little on the low end just make sure you are getting enough protein in your diet kidney and liver function are stable.  A1c looks great at 5.6.  B12 also looks good.

## 2024-05-14 NOTE — Telephone Encounter (Signed)
 Pharmacy Patient Advocate Encounter   Received notification from CoverMyMeds that prior authorization for Pimecrolimus  1% cream is required/requested.   Insurance verification completed.   The patient is insured through [facility].   Per test claim: PA required; PA submitted to above mentioned insurance via Latent Key/confirmation #/EOC [id number] Status is pending

## 2024-05-15 ENCOUNTER — Encounter: Payer: Self-pay | Admitting: Family Medicine

## 2024-05-15 NOTE — Telephone Encounter (Signed)
 Last read by Greig LITTIE Mormon at 1:16PM on 05/15/2024.

## 2024-05-16 NOTE — Telephone Encounter (Signed)
 Pharmacy Patient Advocate Encounter  Received notification from [employer] that Prior Authorization for Pimecrolimus  1% cream has been DENIED.  See denial reason below. No denial letter attached in CMM. Will attach denial letter to Media tab once received.   PA #/Case ID/Reference #: [id number]

## 2024-05-17 MED ORDER — TACROLIMUS 0.1 % EX OINT: TOPICAL_OINTMENT | Freq: Two times a day (BID) | CUTANEOUS | 0 refills | Status: AC

## 2024-05-17 NOTE — Addendum Note (Signed)
 Addended by: Kyo Cocuzza D on: 05/17/2024 03:21 PM   Modules accepted: Orders

## 2024-05-17 NOTE — Telephone Encounter (Signed)
Patient informed of prescription change.  

## 2024-05-17 NOTE — Telephone Encounter (Signed)
 Okay, please let patient know that the Elidel  was not covered but Protopic  is.  Subsent over new prescription for Protopic  so fingers crossed hopefully that 1 will be covered  Meds ordered this encounter  Medications   tacrolimus  (PROTOPIC ) 0.1 % ointment    Sig: Apply topically 2 (two) times daily.    Dispense:  100 g    Refill:  0

## 2024-05-29 NOTE — Progress Notes (Unsigned)
" ° °  Subjective:    Patient ID: Megan [name], female    DOB: June 18, 1970, 54 y.o.   MRN: [id number]  HPI  Patient is here for her B12 injection. Comes in every 30 days. Denies muscle cramps, weakness, medication changes, CP, SOB, or headache   Review of Systems     Objective:   Physical Exam        Assessment & Plan:   Patient given injection in her LD. Tolerated well no redness or swelling noted at the site. Patient is transitioning her B12 injections from every 30 days to every 6 weeks per Dr. Alvan (around 07/11/24). "

## 2024-05-30 ENCOUNTER — Ambulatory Visit

## 2024-05-30 VITALS — BP 132/77 | HR 84 | Resp 19 | Ht 64.0 in | Wt 193.0 lb

## 2024-05-30 DIAGNOSIS — E538 Deficiency of other specified B group vitamins: Secondary | ICD-10-CM | POA: Diagnosis not present

## 2024-05-30 MED ORDER — CYANOCOBALAMIN 1000 MCG/ML IJ SOLN: 1000.0000 ug | INTRAMUSCULAR | Status: AC

## 2024-06-10 ENCOUNTER — Other Ambulatory Visit: Payer: Self-pay | Admitting: Family Medicine

## 2024-06-10 DIAGNOSIS — F411 Generalized anxiety disorder: Secondary | ICD-10-CM

## 2024-06-11 ENCOUNTER — Other Ambulatory Visit: Payer: Self-pay | Admitting: Family Medicine

## 2024-06-11 ENCOUNTER — Other Ambulatory Visit (HOSPITAL_BASED_OUTPATIENT_CLINIC_OR_DEPARTMENT_OTHER): Payer: Self-pay

## 2024-06-11 DIAGNOSIS — E119 Type 2 diabetes mellitus without complications: Secondary | ICD-10-CM

## 2024-06-12 ENCOUNTER — Other Ambulatory Visit (HOSPITAL_BASED_OUTPATIENT_CLINIC_OR_DEPARTMENT_OTHER): Payer: Self-pay

## 2024-06-12 MED ORDER — VENLAFAXINE HCL ER 75 MG PO CP24
75.0000 mg | ORAL_CAPSULE | Freq: Every day | ORAL | 1 refills | Status: AC
  Filled 2024-06-12: qty 90, 90d supply, fill #0

## 2024-06-12 MED ORDER — OZEMPIC (2 MG/DOSE) 8 MG/3ML ~~LOC~~ SOPN
2.0000 mg | PEN_INJECTOR | SUBCUTANEOUS | 0 refills | Status: AC
  Filled 2024-06-12 – 2024-06-13 (×2): qty 9, 84d supply, fill #0

## 2024-06-13 ENCOUNTER — Other Ambulatory Visit (HOSPITAL_BASED_OUTPATIENT_CLINIC_OR_DEPARTMENT_OTHER): Payer: Self-pay

## 2024-06-27 ENCOUNTER — Ambulatory Visit: Admitting: Family Medicine

## 2024-07-11 ENCOUNTER — Ambulatory Visit
